# Patient Record
Sex: Female | Born: 1945 | Race: White | Hispanic: No | Marital: Married | Smoking: Former smoker
Health system: Southern US, Community
[De-identification: ages and names within clinical notes are randomized; demographics above are authoritative.]

## PROBLEM LIST (undated history)

## (undated) DIAGNOSIS — R55 Syncope and collapse: Secondary | ICD-10-CM

## (undated) DIAGNOSIS — M1990 Unspecified osteoarthritis, unspecified site: Secondary | ICD-10-CM

## (undated) DIAGNOSIS — E785 Hyperlipidemia, unspecified: Secondary | ICD-10-CM

## (undated) DIAGNOSIS — T7840XA Allergy, unspecified, initial encounter: Secondary | ICD-10-CM

## (undated) HISTORY — DX: Allergy, unspecified, initial encounter: T78.40XA

## (undated) HISTORY — DX: Hyperlipidemia, unspecified: E78.5

## (undated) HISTORY — PX: TIBIA FRACTURE SURGERY: SHX806

## (undated) HISTORY — DX: Unspecified osteoarthritis, unspecified site: M19.90

## (undated) HISTORY — DX: Syncope and collapse: R55

## (undated) HISTORY — PX: BUNIONECTOMY: SHX129

## (undated) HISTORY — PX: JOINT REPLACEMENT: SHX530

## (undated) HISTORY — PX: TUBAL LIGATION: SHX77

---

## 1967-09-08 HISTORY — PX: FRACTURE SURGERY: SHX138

## 2003-09-08 HISTORY — PX: FRACTURE SURGERY: SHX138

## 2013-10-23 DIAGNOSIS — Z01419 Encounter for gynecological examination (general) (routine) without abnormal findings: Secondary | ICD-10-CM | POA: Diagnosis not present

## 2013-10-23 DIAGNOSIS — Z124 Encounter for screening for malignant neoplasm of cervix: Secondary | ICD-10-CM | POA: Diagnosis not present

## 2013-10-23 DIAGNOSIS — Z0189 Encounter for other specified special examinations: Secondary | ICD-10-CM | POA: Diagnosis not present

## 2013-10-23 DIAGNOSIS — Z6826 Body mass index (BMI) 26.0-26.9, adult: Secondary | ICD-10-CM | POA: Diagnosis not present

## 2014-02-12 DIAGNOSIS — N951 Menopausal and female climacteric states: Secondary | ICD-10-CM | POA: Diagnosis not present

## 2014-02-12 DIAGNOSIS — Z86010 Personal history of colonic polyps: Secondary | ICD-10-CM | POA: Diagnosis not present

## 2014-05-01 DIAGNOSIS — Z1231 Encounter for screening mammogram for malignant neoplasm of breast: Secondary | ICD-10-CM | POA: Diagnosis not present

## 2014-10-03 ENCOUNTER — Ambulatory Visit (INDEPENDENT_AMBULATORY_CARE_PROVIDER_SITE_OTHER): Payer: Medicare Other | Admitting: Nurse Practitioner

## 2014-10-03 ENCOUNTER — Encounter: Payer: Self-pay | Admitting: Nurse Practitioner

## 2014-10-03 VITALS — BP 120/70 | HR 57 | Temp 99.0°F | Resp 18 | Ht 61.5 in | Wt 135.0 lb

## 2014-10-03 DIAGNOSIS — Z6824 Body mass index (BMI) 24.0-24.9, adult: Secondary | ICD-10-CM | POA: Insufficient documentation

## 2014-10-03 DIAGNOSIS — Z1211 Encounter for screening for malignant neoplasm of colon: Secondary | ICD-10-CM

## 2014-10-03 DIAGNOSIS — Z79899 Other long term (current) drug therapy: Secondary | ICD-10-CM | POA: Insufficient documentation

## 2014-10-03 DIAGNOSIS — Z6825 Body mass index (BMI) 25.0-25.9, adult: Secondary | ICD-10-CM | POA: Diagnosis not present

## 2014-10-03 DIAGNOSIS — Z0000 Encounter for general adult medical examination without abnormal findings: Secondary | ICD-10-CM

## 2014-10-03 LAB — HEMOGLOBIN A1C: HEMOGLOBIN A1C: 5.4 % (ref 4.6–6.5)

## 2014-10-03 LAB — COMPREHENSIVE METABOLIC PANEL
ALBUMIN: 4.8 g/dL (ref 3.5–5.2)
ALT: 14 U/L (ref 0–35)
AST: 19 U/L (ref 0–37)
Alkaline Phosphatase: 53 U/L (ref 39–117)
BILIRUBIN TOTAL: 0.6 mg/dL (ref 0.2–1.2)
BUN: 15 mg/dL (ref 6–23)
CO2: 29 meq/L (ref 19–32)
CREATININE: 0.76 mg/dL (ref 0.40–1.20)
Calcium: 10.1 mg/dL (ref 8.4–10.5)
Chloride: 100 mEq/L (ref 96–112)
GFR: 80.18 mL/min (ref >60.00)
GLUCOSE: 89 mg/dL (ref 70–99)
Potassium: 4.2 mEq/L (ref 3.5–5.1)
Sodium: 138 mEq/L (ref 135–145)
TOTAL PROTEIN: 7.6 g/dL (ref 6.0–8.3)

## 2014-10-03 LAB — CBC WITH DIFFERENTIAL/PLATELET
BASOS ABS: 0 10*3/uL (ref 0.0–0.1)
Basophils Relative: 0.6 % (ref 0.0–3.0)
EOS ABS: 0.1 10*3/uL (ref 0.0–0.7)
EOS PCT: 2.1 % (ref 0.0–5.0)
HCT: 44.5 % (ref 36.0–46.0)
HEMOGLOBIN: 15 g/dL (ref 12.0–15.0)
LYMPHS ABS: 2.2 10*3/uL (ref 0.7–4.0)
LYMPHS PCT: 39.3 % (ref 12.0–46.0)
MCHC: 33.7 g/dL (ref 30.0–36.0)
MCV: 101.5 fl — ABNORMAL HIGH (ref 78.0–100.0)
MONO ABS: 0.5 10*3/uL (ref 0.1–1.0)
MONOS PCT: 9.5 % (ref 3.0–12.0)
Neutro Abs: 2.7 10*3/uL (ref 1.4–7.7)
Neutrophils Relative %: 48.5 % (ref 43.0–77.0)
Platelets: 207 10*3/uL (ref 150.0–400.0)
RBC: 4.38 Mil/uL (ref 3.87–5.11)
RDW: 13.6 % (ref 11.5–15.5)
WBC: 5.6 10*3/uL (ref 4.0–10.5)

## 2014-10-03 LAB — TSH: TSH: 2.29 u[IU]/mL (ref 0.35–4.50)

## 2014-10-03 LAB — LIPID PANEL
Cholesterol: 257 mg/dL — ABNORMAL HIGH (ref 0–200)
HDL: 104.4 mg/dL (ref >39.00)
LDL CALC: 134 mg/dL — AB (ref 0–99)
NonHDL: 152.6
TRIGLYCERIDES: 92 mg/dL (ref 0.0–149.0)
Total CHOL/HDL Ratio: 2
VLDL: 18.4 mg/dL (ref 0.0–40.0)

## 2014-10-03 LAB — URINALYSIS, MICROSCOPIC ONLY

## 2014-10-03 LAB — T4, FREE: Free T4: 1.43 ng/dL (ref 0.60–1.60)

## 2014-10-03 NOTE — Assessment & Plan Note (Signed)
Discouraged use of HRT for vasomotor symptoms. Pt states she tried to discontinue, was uncomfortable. Is willing to increase risk for breast ca by continuing. Suggest low dose SSRI. Pt declines. Suggest patch rather than pill to decrease risk of breast ca.  No changes today. F/u 1 yr.

## 2014-10-03 NOTE — Assessment & Plan Note (Signed)
Ref to GI for screening.

## 2014-10-03 NOTE — Progress Notes (Signed)
Subjective:     GISELA LEA is a 69 y.o. female and is here to establish care. The patient reports problems - uses HRT for post-menopausal vasomotor symptoms. She has personal Hx of colon polyp removal floowed by Nml colonoscopy and Pos fam Hx: brother colon & prostate cancer, sister breast cancer.  History   Social History  . Marital Status: Married    Spouse Name: N/A    Number of Children: 3  . Years of Education: N/A   Occupational History  .      retired   Social History Main Topics  . Smoking status: Never Smoker   . Smokeless tobacco: Never Used  . Alcohol Use: No  . Drug Use: No  . Sexual Activity: Yes    Birth Control/ Protection: Post-menopausal   Other Topics Concern  . Not on file   Social History Narrative   Ms. Eckersley lives with her husband, She relocated from [place] Nov., 2015. She is retired, has 3 grown sons that live in [place].   Health Maintenance  Topic Date Due  . DEXA SCAN  08/31/2010  . INFLUENZA VACCINE  04/08/2015  . MAMMOGRAM  10/04/2015  . COLONOSCOPY  10/03/2016  . TETANUS/TDAP  10/03/2020  . PNEUMOCOCCAL POLYSACCHARIDE VACCINE AGE 33 AND OVER  Addressed  . ZOSTAVAX  Addressed    The following portions of the patient's history were reviewed and updated as appropriate: allergies, current medications, past family history, past medical history, past social history, past surgical history and problem list.  Review of Systems Constitutional: negative for fatigue and fevers Eyes: negative for visual disturbance Ears, nose, mouth, throat, and face: positive for seasonal rhinnitis Respiratory: negative for cough and dyspnea on exertion Cardiovascular: negative for lower extremity edema and palpitations Gastrointestinal: negative for constipation, diarrhea and reflux symptoms Integument/breast: negative, bothered by occasional facial hair Musculoskeletal:positive for remote Hx of R knee pain. Ortho eval: meniscal tear. Conservative  treatment. Pain resolved. Endocrine: positive for hot flashes relieved by HRT   Objective:    BP 120/70 mmHg  Pulse 57  Temp(Src) 99 F (37.2 C) (Temporal)  Resp 18  Ht 5' 1.5" (1.562 m)  Wt 135 lb (61.236 kg)  BMI 25.10 kg/m2  SpO2 95% General appearance: alert, cooperative, appears stated age and no distress Head: Normocephalic, without obvious abnormality, atraumatic Eyes: negative findings: lids and lashes normal, conjunctivae and sclerae normal, corneas clear and pupils equal, round, reactive to light and accomodation Ears: cloudy fluid & bubbles bilat TM, bones visible Neck: no adenopathy, no carotid bruit, supple, symmetrical, trachea midline and thyroid not enlarged, symmetric, no tenderness/mass/nodules Lungs: clear to auscultation bilaterally Heart: regular rate and rhythm, S1, S2 normal, no murmur, click, rub or gallop Abdomen: soft, non-tender; bowel sounds normal; no masses,  no organomegaly Extremities: extremities normal, atraumatic, no cyanosis or edema    Assessment:Plan    1. BMI 25.0-25.9,adult Low risk, continue exercise 2. Encounter for long-term (current) use of medications HRT - CBC with Differential/Platelet - Comprehensive metabolic panel - Lipid panel - Hemoglobin A1c - Urine Microscopic  3. Colon cancer screening Pos Fam HX. - Ambulatory referral to Gastroenterology  4. Preventative health care - CBC with Differential/Platelet - Comprehensive metabolic panel - Lipid panel - Hemoglobin A1c - TSH - T4, free - Urine Microscopic  Will request historical records See problem list for complete A&P See pt instructions. F/u 1 yr

## 2014-10-03 NOTE — Progress Notes (Signed)
Pre visit review using our clinic review tool, if applicable. No additional management support is needed unless otherwise documented below in the visit note. 

## 2014-10-03 NOTE — Patient Instructions (Signed)
I do not recommend you continue with HRT due to increased risk for stroke, breast cancer, & dementia in women who take HRT longer than 5 years after menopause.  We can manage hot flashes in other ways and management would be temporary-a few months to 1 year.   Let me know if you would like to reconsider.  Please get colonoscopy.  My office will call with lab results.  See you in 1 year.  Very nice to meet you!

## 2014-10-04 ENCOUNTER — Telehealth: Payer: Self-pay | Admitting: Nurse Practitioner

## 2014-10-04 DIAGNOSIS — Z0000 Encounter for general adult medical examination without abnormal findings: Secondary | ICD-10-CM

## 2014-10-04 NOTE — Telephone Encounter (Signed)
pls call pt: Advise Labs look great. No f/u as necessary.

## 2014-10-04 NOTE — Telephone Encounter (Signed)
Patient notified of results.

## 2015-04-22 ENCOUNTER — Telehealth: Payer: Self-pay

## 2015-04-22 NOTE — Telephone Encounter (Signed)
Rec'd from [place] forward 10 pages to GI Historial Provider

## 2015-04-23 ENCOUNTER — Telehealth: Payer: Self-pay | Admitting: Gastroenterology

## 2015-04-23 NOTE — Telephone Encounter (Signed)
Dr Ardis Hughs needs to have further colon and path reports to make a decision.  The records were taken back to the front for further recommendations

## 2015-04-29 ENCOUNTER — Telehealth: Payer: Self-pay | Admitting: Family Medicine

## 2015-04-29 NOTE — Telephone Encounter (Signed)
Pt LMOM stating that she was thinking she should see an ophthalmologist due to her age and she wanted to see if you could recommend someone for her to go to?  Please advise.

## 2015-04-30 NOTE — Telephone Encounter (Signed)
Left detailed message on pt's cell.  Okay per DPR. 

## 2015-04-30 NOTE — Telephone Encounter (Signed)
Please call pt back: Usually if a patient does not have an eye issue or chronic disease that can cause eye issues (glaucoma, diabetes, retina detachment etc) they go to optometrist for eye exams. I am not certain, since I have never met her, if she has any of these current problems or just in need of vision exam (glasses etc). She can check with her insurance to see if they will cover one, compared to other. I do not have a specific person I know of in the area that I would recommend.

## 2015-05-06 ENCOUNTER — Telehealth: Payer: Self-pay | Admitting: Family Medicine

## 2015-05-06 NOTE — Telephone Encounter (Signed)
Patient is requesting a referral to [facility] audiology to have her hearing tested.

## 2015-05-07 NOTE — Telephone Encounter (Signed)
Please advise 

## 2015-05-07 NOTE — Telephone Encounter (Signed)
Please return patients call, she is requesting a referral to audiology. Patient will need to come in for an appointment, so we can discuss her possible hearing loss and we will need to test her hearing here, to see if there is a deficit and a need for audiology referral. Thanks.

## 2015-05-07 NOTE — Telephone Encounter (Signed)
Patient aware.  Appointment scheduled 05/15/15.

## 2015-05-15 ENCOUNTER — Ambulatory Visit: Payer: Medicare Other | Admitting: Family Medicine

## 2015-10-04 ENCOUNTER — Ambulatory Visit (INDEPENDENT_AMBULATORY_CARE_PROVIDER_SITE_OTHER): Payer: Medicare Other | Admitting: Family Medicine

## 2015-10-04 ENCOUNTER — Encounter: Payer: Self-pay | Admitting: Family Medicine

## 2015-10-04 VITALS — BP 117/80 | HR 68 | Temp 97.5°F | Resp 20 | Ht 62.0 in | Wt 133.5 lb

## 2015-10-04 DIAGNOSIS — N951 Menopausal and female climacteric states: Secondary | ICD-10-CM

## 2015-10-04 DIAGNOSIS — Z1212 Encounter for screening for malignant neoplasm of rectum: Secondary | ICD-10-CM

## 2015-10-04 DIAGNOSIS — Z1231 Encounter for screening mammogram for malignant neoplasm of breast: Secondary | ICD-10-CM

## 2015-10-04 DIAGNOSIS — Z1211 Encounter for screening for malignant neoplasm of colon: Secondary | ICD-10-CM | POA: Diagnosis not present

## 2015-10-04 DIAGNOSIS — Z860100 Personal history of colon polyps, unspecified: Secondary | ICD-10-CM

## 2015-10-04 DIAGNOSIS — Z13220 Encounter for screening for lipoid disorders: Secondary | ICD-10-CM | POA: Diagnosis not present

## 2015-10-04 DIAGNOSIS — Z1329 Encounter for screening for other suspected endocrine disorder: Secondary | ICD-10-CM

## 2015-10-04 DIAGNOSIS — IMO0001 Reserved for inherently not codable concepts without codable children: Secondary | ICD-10-CM

## 2015-10-04 DIAGNOSIS — Z136 Encounter for screening for cardiovascular disorders: Secondary | ICD-10-CM

## 2015-10-04 DIAGNOSIS — Z1239 Encounter for other screening for malignant neoplasm of breast: Secondary | ICD-10-CM

## 2015-10-04 DIAGNOSIS — Z131 Encounter for screening for diabetes mellitus: Secondary | ICD-10-CM

## 2015-10-04 DIAGNOSIS — E2839 Other primary ovarian failure: Secondary | ICD-10-CM

## 2015-10-04 HISTORY — DX: Menopausal and female climacteric states: N95.1

## 2015-10-04 NOTE — Progress Notes (Signed)
Patient ID: Sara Doyle, female   DOB: Apr 20, 1946, 70 y.o.   MRN: [id number] Medicare AWV  Date of Visit: 10/04/2015 Physician: Howard Pouch, DO List of providers/suppliers:  See care plan  History of Present Ilness: Sara Doyle, 70 y.o.  female presents today for [place] visit.    Other Complaints: Chief Complaint  Patient presents with  . Annual Exam   vaginal dryness  Health maintenance:  Colonoscopy: 2010 colonoscopy with recommendations for 5 year surveillance for personal history polpys Mammogram: Sister with breast cancer, all normal mammograms, last mammogram 2014 . Referral placed today.  Cervical cancer screening: PAP last 2014. All normal, has GYN Immunizations: Flu vaccination 2016 up-to-date, tetanus 2012 up-to-date, patient completed both pneumonia vaccinations, Zostavax up-to-date. Infectious disease screening: Not indicated Dexa: Patient reports prior bone density scan was greater than 7 years ago and was normal. Patient does supplement with calcium and vitamin D daily.  Review of Systems: Negative, with the exception of above mentioned in HPI  Past medical, surgical, family and social histories reviewed (including experiences with illnesses, hospital stays, operations, injuries, and treatments):  Past Medical History  Diagnosis Date  . Allergy    Past Surgical History  Procedure Laterality Date  . Tubal ligation    . 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 Hypertension Mother   . Arthritis Father     osteo  . Heart disease Brother     congenital  . Cancer Sister 6    breast  . Arthritis Sister   . Hypertension Sister   . Cancer Brother     prostate, colon  . Arrhythmia Brother     a-fib   Social History   Social History  . Marital Status: Married    Spouse Name: N/A  . Number of Children: 3  . Years of Education: N/A   Occupational History  .     retired   Social History Main Topics  . Smoking status: Never Smoker   . Smokeless tobacco: Never Used  . Alcohol Use: No  . Drug Use: No  . Sexual Activity: Yes    Birth Control/ Protection: Post-menopausal   Other Topics Concern  . Not on file   Social History Narrative   Sara Doyle lives with her husband, She relocated from [place] Nov., 2015. She is retired, has 3 grown sons that live in [place].   Smoke alarm in the home, wears her seatbelt, uses sunscreen.   Ambulates independently. No dentures.   Has dentist with regular appts.    Feels safe in her relationships    Medications and Allergies reviewed:  Current outpatient prescriptions:  .  aspirin 81 MG tablet, Take 81 mg by mouth daily., Disp: , Rfl:  .  Biotin 5000 MCG TABS, Take 5,000 mg by mouth., Disp: , Rfl:  .  Calcium Carb-Cholecalciferol (CALCIUM 600 + D PO), Take by mouth., Disp: , Rfl:  Allergies  Allergen Reactions  . Demerol [Meperidine] Nausea And Vomiting   Functional Status Survey: Is the patient deaf or have difficulty hearing?: No Does the patient have difficulty seeing, even when wearing glasses/contacts?: No Does the patient have difficulty concentrating, remembering, or making decisions?: No Does the patient have difficulty walking or climbing stairs?: No Does the patient have difficulty dressing or bathing?: No Does the patient have difficulty doing errands alone such as visiting a doctor's office  or shopping?: No   Impairments: Impairments of Body Functions:  Mental Functions:Pt is mentally clear.  Sensory Functions:None Skin and Related Structures None Impairments of Body Strutures:  Movement Related Structures: none  ADLs / IADLs: Pt performs all ADL/IDL independently   Risk Evaluation:  Depression screen [facility] 2/9 10/04/2015 10/04/2015  Decreased Interest 0 0  Down, Depressed, Hopeless 0 0  PHQ - 2 Score 0 0    Advanced Care Planning Code status on file: no MOST form information  and copy given: no [place] information provided: no Advanced care directive scanned into chart: no  Preventive Screen Health Maintenance Topics with due status: Overdue     Topic Date Due   Hepatitis C Screening 04/08/1946   DEXA SCAN 08/31/2010   MAMMOGRAM 04/08/2015    Immunizations Immunization History  Administered Date(s) Administered  . Influenza-Unspecified 06/23/2015  . Pneumococcal Conjugate-13 02/12/2014  . Pneumococcal Polysaccharide-23 01/06/2011  . Tdap 01/06/2003   Cardiovascular Screening Blood Tests--> ordered for future, pt not fasting today Glaucoma Screening  Date of Last ophthalmology evaluation: last year 2016 Done by: Hassell Done  Vital Signs Weight: 133 lb 8 oz (60.555 kg) Body mass index is 24.41 kg/(m^2). CrCl cannot be calculated (Patient has no serum creatinine result on file.). Body surface area is 1.63 meters squared. Filed Vitals:   10/04/15 1432  BP: 117/80  Pulse: 68  Temp: 97.5 F (36.4 C)  Resp: 20  Height: 5\' 2"  (1.575 m)  Weight: 133 lb 8 oz (60.555 kg)  SpO2: 97%   Wt Readings from Last 2 Encounters:  10/04/15 133 lb 8 oz (60.555 kg)  10/03/14 135 lb (61.236 kg)    Physical Examination Findings Gen: Afebrile. No acute distress. Nontoxic in appearance, well-developed, well-nourished, Caucasian female. HENT: AT. [place]. Bilateral TM visualized and normal in appearance. MMM. Bilateral nares without erythema or swelling. Throat without erythema or exudates. No cough on exam Eyes:Pupils Equal Round Reactive to light, Extraocular movements intact,  Conjunctiva without redness, discharge or icterus. Neck/lymp/endocrine: Supple, no lymphadenopathy, no thyromegaly CV: RRR , no murmurs clicks gallops or rubs, no edema, +2/4 P posterior tibialis pulses Chest: CTAB, no wheeze or crackles Abd: Soft. Flat. NTND. BS present. No Masses palpated.  MSK: Full range of motion, no obvious deformities. No erythema, no soft tissue  swelling. Neurovascularly intact distally. Skin: No rashes, purpura or petechiae.  Neuro: Normal gait. PERLA. EOMi. Alert. Oriented. Cranial nerves II through XII intact. Muscle strength 5/5 upper and lower extremity. DTRs equal bilaterally. Psych: Normal affect, dress and demeanor. Normal speech. Normal thought content and judgment..    I reviewed Point of Care labs: No results found for this or any previous visit (from the past 24 hour(s)).  Labs: Office Visit on 10/03/2014  Component Date Value  . WBC 10/03/2014 5.6   . RBC 10/03/2014 4.38   . Hemoglobin 10/03/2014 15.0   . HCT 10/03/2014 44.5   . MCV 10/03/2014 101.5*  . MCHC 10/03/2014 33.7   . RDW 10/03/2014 13.6   . Platelets 10/03/2014 207.0   . Neutrophils Relative % 10/03/2014 48.5   . Lymphocytes Relative 10/03/2014 39.3   . Monocytes Relative 10/03/2014 9.5   . Eosinophils Relative 10/03/2014 2.1   . Basophils Relative 10/03/2014 0.6   . Neutro Abs 10/03/2014 2.7   . Lymphs Abs 10/03/2014 2.2   . Monocytes Absolute 10/03/2014 0.5   . Eosinophils Absolute 10/03/2014 0.1   . Basophils Absolute 10/03/2014 0.0   . Sodium  10/03/2014 138   . Potassium 10/03/2014 4.2   . Chloride 10/03/2014 100   . CO2 10/03/2014 29   . Glucose, Bld 10/03/2014 89   . BUN 10/03/2014 15   . Creatinine, Ser 10/03/2014 0.76   . Total Bilirubin 10/03/2014 0.6   . Alkaline Phosphatase 10/03/2014 53   . AST 10/03/2014 19   . ALT 10/03/2014 14   . Total Protein 10/03/2014 7.6   . Albumin 10/03/2014 4.8   . Calcium 10/03/2014 10.1   . GFR 10/03/2014 80.18   . Cholesterol 10/03/2014 257*  . Triglycerides 10/03/2014 92.0   . HDL 10/03/2014 104.40   . VLDL 10/03/2014 18.4   . LDL Cholesterol 10/03/2014 134*  . Total CHOL/HDL Ratio 10/03/2014 2   . NonHDL 10/03/2014 152.60   . Hgb A1c MFr Bld 10/03/2014 5.4   . TSH 10/03/2014 2.29   . Free T4 10/03/2014 1.43   . WBC, UA 10/03/2014 0-2/hpf   . RBC / HPF 10/03/2014 0-2/hpf   . Squamous  Epithelial / LPF 10/03/2014 Rare(0-4/hpf)      Assessment and Plan: Myeesha was seen today for annual exam.  Diagnoses and all orders for this visit:  Vaginal dryness, menopausal - discussed water-based lubricants, patient provided with a list of over-the-counter over-the-counter lubricants for trial.    Education and Counseling Provided:  See after visit summary that was printed and given to patient 1. Tobacco abuse counseling 2. Alcohol abuse counseling 3. STIs counseling 4. Referrals made  Referrals made: For mammogram, DEXA scan and colonoscopy. Health vaccinations are up-to-date.    Future labs placed (pt is not fasting today)

## 2015-10-04 NOTE — Patient Instructions (Addendum)
  Sara Doyle , Thank you for taking time to come for your Medicare Wellness Visit. I appreciate your ongoing commitment to your health goals. Please review the following plan we discussed and let me know if I can assist you in the future.     This is a list of the screening recommended for you and due dates:  Health Maintenance  Topic Date Due  .  Hepatitis C: One time screening is recommended by Center for Disease Control  (CDC) for  adults born from [birth year] through 1966.   [birth year]  . DEXA scan (bone density measurement)  08/31/2010  . Mammogram  04/08/2015  . Flu Shot  04/07/2016  . Colon Cancer Screening  08/10/2019  . Tetanus Vaccine  10/03/2020  . Shingles Vaccine  Addressed  . Pneumonia vaccines  Completed    We ordered your bone density, mammogram and colonoscopy referral.

## 2015-10-08 ENCOUNTER — Encounter: Payer: Self-pay | Admitting: Gastroenterology

## 2015-10-10 ENCOUNTER — Other Ambulatory Visit: Payer: Medicare Other

## 2015-10-10 ENCOUNTER — Other Ambulatory Visit: Payer: Self-pay | Admitting: Family Medicine

## 2015-10-10 DIAGNOSIS — Z1329 Encounter for screening for other suspected endocrine disorder: Secondary | ICD-10-CM

## 2015-10-10 DIAGNOSIS — Z1212 Encounter for screening for malignant neoplasm of rectum: Secondary | ICD-10-CM

## 2015-10-10 DIAGNOSIS — E2839 Other primary ovarian failure: Secondary | ICD-10-CM | POA: Diagnosis not present

## 2015-10-10 DIAGNOSIS — N951 Menopausal and female climacteric states: Secondary | ICD-10-CM

## 2015-10-10 DIAGNOSIS — Z131 Encounter for screening for diabetes mellitus: Secondary | ICD-10-CM

## 2015-10-10 DIAGNOSIS — Z1211 Encounter for screening for malignant neoplasm of colon: Secondary | ICD-10-CM | POA: Diagnosis not present

## 2015-10-10 DIAGNOSIS — Z1231 Encounter for screening mammogram for malignant neoplasm of breast: Secondary | ICD-10-CM

## 2015-10-10 DIAGNOSIS — Z136 Encounter for screening for cardiovascular disorders: Secondary | ICD-10-CM | POA: Diagnosis not present

## 2015-10-10 DIAGNOSIS — Z13220 Encounter for screening for lipoid disorders: Secondary | ICD-10-CM | POA: Diagnosis not present

## 2015-10-10 DIAGNOSIS — IMO0001 Reserved for inherently not codable concepts without codable children: Secondary | ICD-10-CM

## 2015-10-10 DIAGNOSIS — Z1239 Encounter for other screening for malignant neoplasm of breast: Secondary | ICD-10-CM

## 2015-10-10 LAB — CBC WITH DIFFERENTIAL/PLATELET
BASOS PCT: 0 % (ref 0–1)
Basophils Absolute: 0 10*3/uL (ref 0.0–0.1)
Eosinophils Absolute: 0.1 10*3/uL (ref 0.0–0.7)
Eosinophils Relative: 2 % (ref 0–5)
HCT: 41 % (ref 36.0–46.0)
HEMOGLOBIN: 13.7 g/dL (ref 12.0–15.0)
Lymphocytes Relative: 43 % (ref 12–46)
Lymphs Abs: 1.8 10*3/uL (ref 0.7–4.0)
MCH: 33.5 pg (ref 26.0–34.0)
MCHC: 33.4 g/dL (ref 30.0–36.0)
MCV: 100.2 fL — ABNORMAL HIGH (ref 78.0–100.0)
MONO ABS: 0.5 10*3/uL (ref 0.1–1.0)
MPV: 11.3 fL (ref 8.6–12.4)
Monocytes Relative: 11 % (ref 3–12)
NEUTROS ABS: 1.9 10*3/uL (ref 1.7–7.7)
Neutrophils Relative %: 44 % (ref 43–77)
PLATELETS: 237 10*3/uL (ref 150–400)
RBC: 4.09 MIL/uL (ref 3.87–5.11)
RDW: 13.2 % (ref 11.5–15.5)
WBC: 4.3 10*3/uL (ref 4.0–10.5)

## 2015-10-10 LAB — LIPID PANEL
Cholesterol: 234 mg/dL — ABNORMAL HIGH (ref 125–200)
HDL: 112 mg/dL (ref >=46)
LDL Cholesterol: 111 mg/dL (ref <130)
Total CHOL/HDL Ratio: 2.1 Ratio (ref <=5.0)
Triglycerides: 56 mg/dL (ref <150)
VLDL: 11 mg/dL (ref <30)

## 2015-10-10 LAB — TSH: TSH: 3.498 u[IU]/mL (ref 0.350–4.500)

## 2015-10-10 LAB — COMPREHENSIVE METABOLIC PANEL
ALBUMIN: 4.4 g/dL (ref 3.6–5.1)
ALT: 12 U/L (ref 6–29)
AST: 16 U/L (ref 10–35)
Alkaline Phosphatase: 57 U/L (ref 33–130)
BILIRUBIN TOTAL: 0.5 mg/dL (ref 0.2–1.2)
BUN: 17 mg/dL (ref 7–25)
CO2: 28 mmol/L (ref 20–31)
CREATININE: 0.67 mg/dL (ref 0.60–0.93)
Calcium: 9.3 mg/dL (ref 8.6–10.4)
Chloride: 98 mmol/L (ref 98–110)
GLUCOSE: 86 mg/dL (ref 65–99)
Potassium: 4.5 mmol/L (ref 3.5–5.3)
SODIUM: 133 mmol/L — AB (ref 135–146)
Total Protein: 6.8 g/dL (ref 6.1–8.1)

## 2015-10-11 ENCOUNTER — Telehealth: Payer: Self-pay | Admitting: Family Medicine

## 2015-10-11 NOTE — Telephone Encounter (Signed)
Please call patient, all of her lab work looks stable today. Her cholesterol is mildly elevated with a total cholesterol of approximately 230. She could consider fish oil supplementation or just increase her exercise, and stricter dietary habits with low saturated fats, fresh fruits and vegetables.

## 2015-10-11 NOTE — Telephone Encounter (Signed)
Left message with lab results and instructions on patient voice mail per DPR 

## 2015-11-07 ENCOUNTER — Other Ambulatory Visit: Payer: Medicare Other

## 2015-11-07 ENCOUNTER — Inpatient Hospital Stay: Admission: RE | Admit: 2015-11-07 | Payer: Medicare Other | Source: Ambulatory Visit

## 2015-11-07 ENCOUNTER — Ambulatory Visit: Payer: Medicare Other

## 2015-11-29 ENCOUNTER — Ambulatory Visit
Admission: RE | Admit: 2015-11-29 | Discharge: 2015-11-29 | Disposition: A | Discharge status: Home / Self Care | Payer: Medicare Other | Source: Ambulatory Visit | Attending: Family Medicine | Admitting: Family Medicine

## 2015-11-29 DIAGNOSIS — M85851 Other specified disorders of bone density and structure, right thigh: Secondary | ICD-10-CM | POA: Diagnosis not present

## 2015-11-29 DIAGNOSIS — E2839 Other primary ovarian failure: Secondary | ICD-10-CM

## 2015-11-29 DIAGNOSIS — Z1231 Encounter for screening mammogram for malignant neoplasm of breast: Secondary | ICD-10-CM

## 2015-11-29 DIAGNOSIS — Z0000 Encounter for general adult medical examination without abnormal findings: Secondary | ICD-10-CM

## 2015-12-02 ENCOUNTER — Telehealth: Payer: Self-pay | Admitting: Family Medicine

## 2015-12-02 DIAGNOSIS — M8580 Other specified disorders of bone density and structure, unspecified site: Secondary | ICD-10-CM | POA: Insufficient documentation

## 2015-12-02 NOTE — Telephone Encounter (Signed)
Spoke with patient reviewed results and instructions . Patient verbalized understanding. 

## 2015-12-02 NOTE — Telephone Encounter (Signed)
Please call patient: Her bone density scan showed that she has osteopenia, which is softening of the bone. We would want to ensure she doesn't progress to osteoporosis, therefore she needs to make certain she is supplementing with 1000 units of vitamin D daily and calcium supplementation. - monitor her bone density 2-3 years.

## 2015-12-03 ENCOUNTER — Ambulatory Visit (INDEPENDENT_AMBULATORY_CARE_PROVIDER_SITE_OTHER): Payer: Medicare Other | Admitting: Gastroenterology

## 2015-12-03 ENCOUNTER — Encounter: Payer: Self-pay | Admitting: Gastroenterology

## 2015-12-03 VITALS — BP 126/64 | HR 70 | Ht 61.5 in | Wt 133.0 lb

## 2015-12-03 DIAGNOSIS — Z86010 Personal history of colonic polyps: Secondary | ICD-10-CM | POA: Diagnosis not present

## 2015-12-03 NOTE — Progress Notes (Signed)
HPI: This is a   very pleasant 70 year old woman    who was referred to me by Howard Pouch A, DO  to evaluate  history of colon polyps .    Chief complaint is history of colon polyps  Colonoscopy 2010 at [facility] in [place] done for "high risk colon cancer surveillance, personal history of colon polyps. "  The examination was normal.  She was recommended to have repeat colonoscopy at five-year interval  Also had colonoscopy 2007; we don't have those results here.  She understands she had polyps (2-3 of them).   She is not sure what type of polyps were found.  Cbc 10/2015 showed she is not anemic  Moved to  2015:   No FH of colon cancer.  Stable weight.  No Gi issues, no bleeding.  Review of systems: Pertinent positive and negative review of systems were noted in the above HPI section. Complete review of systems was performed and was otherwise normal.   Past Medical History  Diagnosis Date  . Allergy     Past Surgical History  Procedure Laterality Date  . Tubal ligation    . 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 aspirin 81 MG tablet Take 81 mg by mouth daily.    . Biotin 5000 MCG TABS Take 5,000 mg by mouth.    . Calcium Carb-Cholecalciferol (CALCIUM 600 + D PO) Take by mouth.     No current facility-administered medications for this visit.    Allergies as of 12/03/2015 - Review Complete 12/03/2015  Allergen Reaction Noted  . Demerol [meperidine] Nausea And Vomiting 10/03/2014    Family History  Problem Relation Age of Onset  . Hypertension Mother   . Arthritis Father     osteo  . Heart disease Brother     congenital  . Cancer Sister 12    breast  . Arthritis Sister   . Hypertension Sister   . Cancer Brother     prostate, colon  . Arrhythmia Brother     a-fib    Social History   Social History  . Marital Status: Married    Spouse Name: N/A  . Number  of Children: 3  . Years of Education: N/A   Occupational History  .      retired   Social History Main Topics  . Smoking status: Never Smoker   . Smokeless tobacco: Never Used  . Alcohol Use: No  . Drug Use: No  . Sexual Activity: Yes    Birth Control/ Protection: Post-menopausal   Other Topics Concern  . Not on file   Social History Narrative   Ms. Chiba lives with her husband, She relocated from [place] Nov., 2015. She is retired, has 3 grown sons that live in [place].   Smoke alarm in the home, wears her seatbelt, uses sunscreen.   Ambulates independently. No dentures.   Has dentist with regular appts.    Feels safe in her relationships     Physical Exam: BP 126/64 mmHg  Pulse 70  Ht 5' 1.5" (1.562 m)  Wt 133 lb (60.328 kg)  BMI 24.73 kg/m2 Constitutional: generally well-appearing Psychiatric: alert and oriented x3 Eyes: extraocular movements intact Mouth: oral pharynx moist, no lesions Neck: supple no lymphadenopathy Cardiovascular: heart regular rate and rhythm Lungs: clear to auscultation bilaterally Abdomen: soft, nontender, nondistended, no obvious ascites, no peritoneal signs, normal  bowel sounds Extremities: no lower extremity edema bilaterally Skin: no lesions on visible extremities   Assessment and plan: 70 y.o. female with  Personal history of colon polyps  We will try to get records from her 2007 colonoscopy, pathology report as well. That will help determine the timing of her next needed screening, surveillance colonoscopy examination. She has no GI symptoms, no family history that is concerning for colon cancer. I see no reason for any further blood tests or imaging studies currently    Owens Loffler, MD [facility] 12/03/2015, 1:58 PM  Cc: Howard Pouch A, DO

## 2015-12-03 NOTE — Patient Instructions (Signed)
We will get records sent from your previous gastroenterologist([facility], 2007 colonoscopy at [facility]) for review.  This will include any endoscopic (colonoscopy or upper endoscopy) procedures and any associated pathology reports.   After reviewing those records, will decide timing of next colonoscopy.

## 2016-01-16 ENCOUNTER — Telehealth: Payer: Self-pay | Admitting: Gastroenterology

## 2016-01-16 NOTE — Telephone Encounter (Signed)
Pt has been notified that we have not received the records and that the release was refaxed.  She will call that office and try to access the records herself.

## 2016-06-15 DIAGNOSIS — Z23 Encounter for immunization: Secondary | ICD-10-CM | POA: Diagnosis not present

## 2016-06-16 ENCOUNTER — Encounter: Payer: Self-pay | Admitting: Family Medicine

## 2016-06-17 ENCOUNTER — Other Ambulatory Visit: Payer: Self-pay | Admitting: Family Medicine

## 2016-06-17 MED ORDER — ACETAZOLAMIDE 125 MG PO TABS: ORAL_TABLET | ORAL | 0 refills | Status: DC

## 2016-06-17 NOTE — Progress Notes (Signed)
Diamox prescribed.

## 2016-08-07 ENCOUNTER — Telehealth: Payer: Self-pay | Admitting: Gastroenterology

## 2016-08-07 NOTE — Telephone Encounter (Signed)
Rec'd from [facility] forward 3 pages to Dr. Ardis Hughs

## 2016-09-04 ENCOUNTER — Telehealth: Payer: Self-pay | Admitting: Gastroenterology

## 2016-09-09 NOTE — Telephone Encounter (Signed)
Dr Ardis Hughs- There is a note from 08/07/16 indicating that records from [facility] were forwarded to our office. However, I see no documentation that we have received any records and no sign of the records themselves. To you have any recollection as to whether you have seen anything on this lady?

## 2016-09-11 NOTE — Telephone Encounter (Signed)
I have spoken to patient to advise that Dr Ardis Hughs has reviewed colonoscopies from 2006 and 2010 and now recommends repeat colonoscopy in 08-2019. Patient verbalizes understanding and recall colonoscopy has been placed in EPIC.

## 2016-09-11 NOTE — Telephone Encounter (Signed)
I reviewed the records from 2006 colonoscopy . She had a single subCM tubular adenoma. This is a low risk adenoma and so since her colonoscopy in 08/2009 was normal she needs recall colonoscopy 10 years from then.   Please let her know I reviewed this and put her in for recall colonsocopy 08/2019.  thanks

## 2016-09-24 ENCOUNTER — Encounter: Payer: Self-pay | Admitting: Family Medicine

## 2016-10-02 ENCOUNTER — Telehealth: Payer: Self-pay

## 2016-10-02 NOTE — Telephone Encounter (Signed)
Called to speak to patient regarding AWV. Requesting for Health Coach to complete rather than PCP, no VM available.

## 2016-10-05 ENCOUNTER — Ambulatory Visit (INDEPENDENT_AMBULATORY_CARE_PROVIDER_SITE_OTHER): Payer: Medicare Other

## 2016-10-05 VITALS — BP 118/68 | HR 79 | Ht 62.0 in | Wt 134.1 lb

## 2016-10-05 DIAGNOSIS — Z0000 Encounter for general adult medical examination without abnormal findings: Secondary | ICD-10-CM | POA: Diagnosis not present

## 2016-10-05 NOTE — Progress Notes (Signed)
Subjective:   Sara Doyle is a 71 y.o. female who presents for Medicare Annual (Subsequent) preventive examination.  Review of Systems:  No ROS.  Medicare Wellness Visit.  Cardiac Risk Factors include: advanced age (>30men, >6 women);family history of premature cardiovascular disease   Sleep patterns: sleeps 6 hours, voids x 1. Occasionally has difficulty going back to sleep, discussed avoiding use of cell phone and completing puzzles during these times.  Home Safety/Smoke Alarms: Smoke detectors and security in place.  Living environment; residence and Firearm Safety: Lives with husband in 2 story home. Firearms locked away.  Seat Belt Safety/Bike Helmet: Wears seat belt.   Counseling:   Eye Exam-Last exam 05/2016, yearly by Rolene Arbour exam 04/2016, every 6 months by Sallyanne Kuster  Female:   Pap-N/A       Mammo- 12/10/2015,  Negative.      Dexa scan-11/29/2015, Osteopenia. Takes Calcium with Vitamin D.  CCS-colonoscopy 08/09/2009, normal. Recall 10 years.        Objective:     Vitals: BP 118/68 (BP Location: Right Arm, Patient Position: Sitting, Cuff Size: Normal)   Pulse 79   Ht 5\' 2"  (1.575 m)   Wt 134 lb 1.9 oz (60.8 kg)   SpO2 95%   BMI 24.53 kg/m   Body mass index is 24.53 kg/m.   Tobacco History  Smoking Status  . Never Smoker  Smokeless Tobacco  . Never Used     Counseling given: Not Answered   Past Medical History:  Diagnosis Date  . Allergy    Past Surgical History:  Procedure Laterality Date  . FRACTURE SURGERY  1969   right leg  . FRACTURE SURGERY  2005   right leg  . 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 TUBAL LIGATION     Family History  Problem Relation Age of Onset  . Hypertension Mother   . Arthritis Father     osteo  . Heart disease Brother     congenital  . Cancer Sister 38    breast  . Arthritis Sister   . Hypertension Sister   . Cancer Brother     prostate, colon  . Arrhythmia Brother      a-fib   History  Sexual Activity  . Sexual activity: Yes  . Birth control/ protection: Post-menopausal    Outpatient Encounter Prescriptions as of 10/05/2016  Medication Sig  . aspirin 81 MG tablet Take 81 mg by mouth daily.  . Biotin 5000 MCG TABS Take 5,000 mg by mouth.  . Calcium Carb-Cholecalciferol (CALCIUM 600 + D PO) Take by mouth.  Marland Kitchen acetaZOLAMIDE (DIAMOX) 125 MG tablet 125 mg BID, start 1 day before travel and continue until 3 days at same elevation or descent. (Patient not taking: Reported on 10/05/2016)   No facility-administered encounter medications on file as of 10/05/2016.     Activities of Daily Living In your present state of health, do you have any difficulty performing the following activities: 10/05/2016  Hearing? N  Vision? N  Difficulty concentrating or making decisions? N  Walking or climbing stairs? N  Dressing or bathing? N  Doing errands, shopping? N  Preparing Food and eating ? N  Using the Toilet? N  In the past six months, have you accidently leaked urine? N  Do you have problems with loss of bowel control? N  Managing your Medications? N  Managing your Finances? N  Housekeeping or managing your Housekeeping? N  Some  recent data might be hidden    Patient Care Team: Ma Hillock, DO as PCP - General (Family Medicine) Milus Banister, MD as Attending Physician (Gastroenterology)    Assessment:    Physical assessment deferred to PCP.  Exercise Activities and Dietary recommendations Current Exercise Habits: Structured exercise class, Type of exercise: walking;strength training/weights (thai chi), Frequency (Times/Week): 3, Intensity: Moderate, Exercise limited by: None identified   Diet (meal preparation, eat out, water intake, caffeinated beverages, dairy products, fruits and vegetables): Eats 3 meals/day. Snacks on fruits and vegetables. Drinks 8 cups coffee (decaf) and water.   Breakfast: oatmeal with fruit, yogurt Lunch: salad, fruit,  cheese Dinner: meat, vegetables, salad. Limited red meat (1 time week)  Encouraged to continue to make food choices and remain active.   Goals    . Weight (lb) < 124 lb (56.2 kg)          Would like to lose 10 pounds by decreasing about of salty snacks (nuts).       Fall Risk Fall Risk  10/05/2016 10/04/2015 10/04/2015  Falls in the past year? No No No  Risk for fall due to : - Impaired vision -   Depression Screen PHQ 2/9 Scores 10/05/2016 10/04/2015 10/04/2015  PHQ - 2 Score 0 0 0     Cognitive Function       Ad8 score reviewed for issues:  Issues making decisions:no  Less interest in hobbies / activities:no  Repeats questions, stories (family complaining):no  Trouble using ordinary gadgets (microwave, computer, phone):no  Forgets the month or year: no  Mismanaging finances: no  Remembering appts:no  Daily problems with thinking and/or memory:no Ad8 score is=0  Patient [facility] at [facility].      Immunization History  Administered Date(s) Administered  . Influenza-Unspecified 06/23/2015, 06/21/2016  . Pneumococcal Conjugate-13 02/12/2014  . Pneumococcal Polysaccharide-23 01/06/2011  . Tdap 01/06/2003   Screening Tests Health Maintenance  Topic Date Due  . Hepatitis C Screening  [birth year]  . MAMMOGRAM  11/28/2017  . COLONOSCOPY  08/10/2019  . TETANUS/TDAP  10/03/2020  . INFLUENZA VACCINE  Completed  . DEXA SCAN  Completed  . ZOSTAVAX  Addressed  . PNA vac Low Risk Adult  Completed      Plan:     Continue to eat heart healthy diet (full of fruits, vegetables, whole grains, lean protein, water--limit salt, fat, and sugar intake) and increase physical activity as tolerated.  Continue doing brain stimulating activities (puzzles, reading, adult coloring books, staying active) to keep memory sharp.   Bring a copy of your advance directives to your next office visit.    During the course of the visit the patient was educated and  counseled about the following appropriate screening and preventive services:   Vaccines to include Pneumoccal, Influenza, Hepatitis B, Td, Zostavax, HCV  Cardiovascular Disease  Colorectal cancer screening  Bone density screening  Diabetes screening  Glaucoma screening  Mammography/PAP  Nutrition counseling   Patient Instructions (the written plan) was given to the patient.   Gerilyn Nestle, RN  10/05/2016   Medical screening examination/treatment/procedure(s) were performed by non-physician practitioner and as supervising physician I was immediately available for consultation/collaboration.  I agree with above assessment and plan.  Electronically Signed by: Howard Pouch, DO [facility]

## 2016-10-05 NOTE — Progress Notes (Signed)
Pre visit review using our clinic review tool, if applicable. No additional management support is needed unless otherwise documented below in the visit note. 

## 2016-10-05 NOTE — Patient Instructions (Addendum)
Continue to eat heart healthy diet (full of fruits, vegetables, whole grains, lean protein, water--limit salt, fat, and sugar intake) and increase physical activity as tolerated.  Continue doing brain stimulating activities (puzzles, reading, adult coloring books, staying active) to keep memory sharp.   Bring a copy of your advance directives to your next office visit.    Fall Prevention in the Home Introduction Falls can cause injuries. They can happen to people of all ages. There are many things you can do to make your home safe and to help prevent falls. What can I do on the outside of my home?  Regularly fix the edges of walkways and driveways and fix any cracks.  Remove anything that might make you trip as you walk through a door, such as a raised step or threshold.  Trim any bushes or trees on the path to your home.  Use bright outdoor lighting.  Clear any walking paths of anything that might make someone trip, such as rocks or tools.  Regularly check to see if handrails are loose or broken. Make sure that both sides of any steps have handrails.  Any raised decks and porches should have guardrails on the edges.  Have any leaves, snow, or ice cleared regularly.  Use sand or salt on walking paths during winter.  Clean up any spills in your garage right away. This includes oil or grease spills. What can I do in the bathroom?  Use night lights.  Install grab bars by the toilet and in the tub and shower. Do not use towel bars as grab bars.  Use non-skid mats or decals in the tub or shower.  If you need to sit down in the shower, use a plastic, non-slip stool.  Keep the floor dry. Clean up any water that spills on the floor as soon as it happens.  Remove soap buildup in the tub or shower regularly.  Attach bath mats securely with double-sided non-slip rug tape.  Do not have throw rugs and other things on the floor that can make you trip. What can I do in the  bedroom?  Use night lights.  Make sure that you have a light by your bed that is easy to reach.  Do not use any sheets or blankets that are too big for your bed. They should not hang down onto the floor.  Have a firm chair that has side arms. You can use this for support while you get dressed.  Do not have throw rugs and other things on the floor that can make you trip. What can I do in the kitchen?  Clean up any spills right away.  Avoid walking on wet floors.  Keep items that you use a lot in easy-to-reach places.  If you need to reach something above you, use a strong step stool that has a grab bar.  Keep electrical cords out of the way.  Do not use floor polish or wax that makes floors slippery. If you must use wax, use non-skid floor wax.  Do not have throw rugs and other things on the floor that can make you trip. What can I do with my stairs?  Do not leave any items on the stairs.  Make sure that there are handrails on both sides of the stairs and use them. Fix handrails that are broken or loose. Make sure that handrails are as long as the stairways.  Check any carpeting to make sure that it is firmly attached to  the stairs. Fix any carpet that is loose or worn.  Avoid having throw rugs at the top or bottom of the stairs. If you do have throw rugs, attach them to the floor with carpet tape.  Make sure that you have a light switch at the top of the stairs and the bottom of the stairs. If you do not have them, ask someone to add them for you. What else can I do to help prevent falls?  Wear shoes that:  Do not have high heels.  Have rubber bottoms.  Are comfortable and fit you well.  Are closed at the toe. Do not wear sandals.  If you use a stepladder:  Make sure that it is fully opened. Do not climb a closed stepladder.  Make sure that both sides of the stepladder are locked into place.  Ask someone to hold it for you, if possible.  Clearly mark and make  sure that you can see:  Any grab bars or handrails.  First and last steps.  Where the edge of each step is.  Use tools that help you move around (mobility aids) if they are needed. These include:  Canes.  Walkers.  Scooters.  Crutches.  Turn on the lights when you go into a dark area. Replace any light bulbs as soon as they burn out.  Set up your furniture so you have a clear path. Avoid moving your furniture around.  If any of your floors are uneven, fix them.  If there are any pets around you, be aware of where they are.  Review your medicines with your doctor. Some medicines can make you feel dizzy. This can increase your chance of falling. Ask your doctor what other things that you can do to help prevent falls. This information is not intended to replace advice given to you by your health care provider. Make sure you discuss any questions you have with your health care provider. Document Released: 06/20/2009 Document Revised: 01/30/2016 Document Reviewed: 09/28/2014  2017 [employer]vier  Health Maintenance, Female Introduction Adopting a healthy lifestyle and getting preventive care can go a long way to promote health and wellness. Talk with your health care provider about what schedule of regular examinations is right for you. This is a good chance for you to check in with your provider about disease prevention and staying healthy. In between checkups, there are plenty of things you can do on your own. Experts have done a lot of research about which lifestyle changes and preventive measures are most likely to keep you healthy. Ask your health care provider for more information. Weight and diet Eat a healthy diet  Be sure to include plenty of vegetables, fruits, low-fat dairy products, and lean protein.  Do not eat a lot of foods high in solid fats, added sugars, or salt.  Get regular exercise. This is one of the most important things you can do for your health.  Most  adults should exercise for at least 150 minutes each week. The exercise should increase your heart rate and make you sweat (moderate-intensity exercise).  Most adults should also do strengthening exercises at least twice a week. This is in addition to the moderate-intensity exercise. Maintain a healthy weight  Body mass index (BMI) is a measurement that can be used to identify possible weight problems. It estimates body fat based on height and weight. Your health care provider can help determine your BMI and help you achieve or maintain a healthy weight.  For  females 3 years of age and older:  A BMI below 18.5 is considered underweight.  A BMI of 18.5 to 24.9 is normal.  A BMI of 25 to 29.9 is considered overweight.  A BMI of 30 and above is considered obese. Watch levels of cholesterol and blood lipids  You should start having your blood tested for lipids and cholesterol at 72 years of age, then have this test every 5 years.  You may need to have your cholesterol levels checked more often if:  Your lipid or cholesterol levels are high.  You are older than 72 years of age.  You are at high risk for heart disease. Cancer screening Lung Cancer  Lung cancer screening is recommended for adults 38-80 years old who are at high risk for lung cancer because of a history of smoking.  A yearly low-dose CT scan of the lungs is recommended for people who:  Currently smoke.  Have quit within the past 15 years.  Have at least a 30-pack-year history of smoking. A pack year is smoking an average of one pack of cigarettes a day for 1 year.  Yearly screening should continue until it has been 15 years since you quit.  Yearly screening should stop if you develop a health problem that would prevent you from having lung cancer treatment. Breast Cancer  Practice breast self-awareness. This means understanding how your breasts normally appear and feel.  It also means doing regular breast  self-exams. Let your health care provider know about any changes, no matter how small.  If you are in your 20s or 30s, you should have a clinical breast exam (CBE) by a health care provider every 1-3 years as part of a regular health exam.  If you are 40 or older, have a CBE every year. Also consider having a breast X-ray (mammogram) every year.  If you have a family history of breast cancer, talk to your health care provider about genetic screening.  If you are at high risk for breast cancer, talk to your health care provider about having an MRI and a mammogram every year.  Breast cancer gene (BRCA) assessment is recommended for women who have family members with BRCA-related cancers. BRCA-related cancers include:  Breast.  Ovarian.  Tubal.  Peritoneal cancers.  Results of the assessment will determine the need for genetic counseling and BRCA1 and BRCA2 testing. Cervical Cancer  Your health care provider may recommend that you be screened regularly for cancer of the pelvic organs (ovaries, uterus, and vagina). This screening involves a pelvic examination, including checking for microscopic changes to the surface of your cervix (Pap test). You may be encouraged to have this screening done every 3 years, beginning at age 75.  For women ages 69-65, health care providers may recommend pelvic exams and Pap testing every 3 years, or they may recommend the Pap and pelvic exam, combined with testing for human papilloma virus (HPV), every 5 years. Some types of HPV increase your risk of cervical cancer. Testing for HPV may also be done on women of any age with unclear Pap test results.  Other health care providers may not recommend any screening for nonpregnant women who are considered low risk for pelvic cancer and who do not have symptoms. Ask your health care provider if a screening pelvic exam is right for you.  If you have had past treatment for cervical cancer or a condition that could lead  to cancer, you need Pap tests and screening for  cancer for at least 20 years after your treatment. If Pap tests have been discontinued, your risk factors (such as having a new sexual partner) need to be reassessed to determine if screening should resume. Some women have medical problems that increase the chance of getting cervical cancer. In these cases, your health care provider may recommend more frequent screening and Pap tests. Colorectal Cancer  This type of cancer can be detected and often prevented.  Routine colorectal cancer screening usually begins at 72 years of age and continues through 73 years of age.  Your health care provider may recommend screening at an earlier age if you have risk factors for colon cancer.  Your health care provider may also recommend using home test kits to check for hidden blood in the stool.  A small camera at the end of a tube can be used to examine your colon directly (sigmoidoscopy or colonoscopy). This is done to check for the earliest forms of colorectal cancer.  Routine screening usually begins at age 24.  Direct examination of the colon should be repeated every 5-10 years through 72 years of age. However, you may need to be screened more often if early forms of precancerous polyps or small growths are found. Skin Cancer  Check your skin from head to toe regularly.  Tell your health care provider about any new moles or changes in moles, especially if there is a change in a mole's shape or color.  Also tell your health care provider if you have a mole that is larger than the size of a pencil eraser.  Always use sunscreen. Apply sunscreen liberally and repeatedly throughout the day.  Protect yourself by wearing long sleeves, pants, a wide-brimmed hat, and sunglasses whenever you are outside. Heart disease, diabetes, and high blood pressure  High blood pressure causes heart disease and increases the risk of stroke. High blood pressure is more  likely to develop in:  People who have blood pressure in the high end of the normal range (130-139/85-89 mm Hg).  People who are overweight or obese.  People who are African American.  If you are 23-4 years of age, have your blood pressure checked every 3-5 years. If you are 23 years of age or older, have your blood pressure checked every year. You should have your blood pressure measured twice-once when you are at a hospital or clinic, and once when you are not at a hospital or clinic. Record the average of the two measurements. To check your blood pressure when you are not at a hospital or clinic, you can use:  An automated blood pressure machine at a pharmacy.  A home blood pressure monitor.  If you are between 61 years and 64 years old, ask your health care provider if you should take aspirin to prevent strokes.  Have regular diabetes screenings. This involves taking a blood sample to check your fasting blood sugar level.  If you are at a normal weight and have a low risk for diabetes, have this test once every three years after 72 years of age.  If you are overweight and have a high risk for diabetes, consider being tested at a younger age or more often. Preventing infection Hepatitis B  If you have a higher risk for hepatitis B, you should be screened for this virus. You are considered at high risk for hepatitis B if:  You were born in a country where hepatitis B is common. Ask your health care provider which countries  are considered high risk.  Your parents were born in a high-risk country, and you have not been immunized against hepatitis B (hepatitis B vaccine).  You have HIV or AIDS.  You use needles to inject street drugs.  You live with someone who has hepatitis B.  You have had sex with someone who has hepatitis B.  You get hemodialysis treatment.  You take certain medicines for conditions, including cancer, organ transplantation, and autoimmune  conditions. Hepatitis C  Blood testing is recommended for:  Everyone born from [birth year] through 1966.  Anyone with known risk factors for hepatitis C. Sexually transmitted infections (STIs)  You should be screened for sexually transmitted infections (STIs) including gonorrhea and chlamydia if:  You are sexually active and are younger than 72 years of age.  You are older than 72 years of age and your health care provider tells you that you are at risk for this type of infection.  Your sexual activity has changed since you were last screened and you are at an increased risk for chlamydia or gonorrhea. Ask your health care provider if you are at risk.  If you do not have HIV, but are at risk, it may be recommended that you take a prescription medicine daily to prevent HIV infection. This is called pre-exposure prophylaxis (PrEP). You are considered at risk if:  You are sexually active and do not regularly use condoms or know the HIV status of your partner(s).  You take drugs by injection.  You are sexually active with a partner who has HIV. Talk with your health care provider about whether you are at high risk of being infected with HIV. If you choose to begin PrEP, you should first be tested for HIV. You should then be tested every 3 months for as long as you are taking PrEP. Pregnancy  If you are premenopausal and you may become pregnant, ask your health care provider about preconception counseling.  If you may become pregnant, take 400 to 800 micrograms (mcg) of folic acid every day.  If you want to prevent pregnancy, talk to your health care provider about birth control (contraception). Osteoporosis and menopause  Osteoporosis is a disease in which the bones lose minerals and strength with aging. This can result in serious bone fractures. Your risk for osteoporosis can be identified using a bone density scan.  If you are 76 years of age or older, or if you are at risk for  osteoporosis and fractures, ask your health care provider if you should be screened.  Ask your health care provider whether you should take a calcium or vitamin D supplement to lower your risk for osteoporosis.  Menopause may have certain physical symptoms and risks.  Hormone replacement therapy may reduce some of these symptoms and risks. Talk to your health care provider about whether hormone replacement therapy is right for you. Follow these instructions at home:  Schedule regular health, dental, and eye exams.  Stay current with your immunizations.  Do not use any tobacco products including cigarettes, chewing tobacco, or electronic cigarettes.  If you are pregnant, do not drink alcohol.  If you are breastfeeding, limit how much and how often you drink alcohol.  Limit alcohol intake to no more than 1 drink per day for nonpregnant women. One drink equals 12 ounces of beer, 5 ounces of wine, or 1 ounces of hard liquor.  Do not use street drugs.  Do not share needles.  Ask your health care provider for help  if you need support or information about quitting drugs.  Tell your health care provider if you often feel depressed.  Tell your health care provider if you have ever been abused or do not feel safe at home. This information is not intended to replace advice given to you by your health care provider. Make sure you discuss any questions you have with your health care provider. Document Released: 03/09/2011 Document Revised: 01/30/2016 Document Reviewed: 05/28/2015  2017 [employer]

## 2016-10-12 ENCOUNTER — Ambulatory Visit (INDEPENDENT_AMBULATORY_CARE_PROVIDER_SITE_OTHER): Payer: Medicare Other | Admitting: Family Medicine

## 2016-10-12 ENCOUNTER — Encounter: Payer: Self-pay | Admitting: Family Medicine

## 2016-10-12 VITALS — BP 120/83 | HR 59 | Temp 97.3°F | Resp 16 | Ht 61.5 in | Wt 134.0 lb

## 2016-10-12 DIAGNOSIS — Z1159 Encounter for screening for other viral diseases: Secondary | ICD-10-CM | POA: Diagnosis not present

## 2016-10-12 DIAGNOSIS — Z13220 Encounter for screening for lipoid disorders: Secondary | ICD-10-CM | POA: Diagnosis not present

## 2016-10-12 DIAGNOSIS — Z1239 Encounter for other screening for malignant neoplasm of breast: Secondary | ICD-10-CM

## 2016-10-12 DIAGNOSIS — Z1231 Encounter for screening mammogram for malignant neoplasm of breast: Secondary | ICD-10-CM

## 2016-10-12 DIAGNOSIS — Z130 Encounter for screening for diseases of the blood and blood-forming organs and certain disorders involving the immune mechanism: Secondary | ICD-10-CM | POA: Diagnosis not present

## 2016-10-12 DIAGNOSIS — Z6824 Body mass index (BMI) 24.0-24.9, adult: Secondary | ICD-10-CM

## 2016-10-12 DIAGNOSIS — M8580 Other specified disorders of bone density and structure, unspecified site: Secondary | ICD-10-CM

## 2016-10-12 DIAGNOSIS — E7800 Pure hypercholesterolemia, unspecified: Secondary | ICD-10-CM | POA: Diagnosis not present

## 2016-10-12 LAB — BASIC METABOLIC PANEL
BUN: 13 mg/dL (ref 6–23)
CALCIUM: 9.4 mg/dL (ref 8.4–10.5)
CO2: 30 meq/L (ref 19–32)
CREATININE: 0.68 mg/dL (ref 0.40–1.20)
Chloride: 97 mEq/L (ref 96–112)
GFR: 90.63 mL/min (ref >60.00)
GLUCOSE: 80 mg/dL (ref 70–99)
Potassium: 4.5 mEq/L (ref 3.5–5.1)
Sodium: 134 mEq/L — ABNORMAL LOW (ref 135–145)

## 2016-10-12 LAB — CBC WITH DIFFERENTIAL/PLATELET
BASOS PCT: 0.4 % (ref 0.0–3.0)
Basophils Absolute: 0 10*3/uL (ref 0.0–0.1)
EOS PCT: 1.5 % (ref 0.0–5.0)
Eosinophils Absolute: 0.1 10*3/uL (ref 0.0–0.7)
HCT: 39.4 % (ref 36.0–46.0)
HEMOGLOBIN: 13.5 g/dL (ref 12.0–15.0)
Lymphocytes Relative: 45.6 % (ref 12.0–46.0)
Lymphs Abs: 2 10*3/uL (ref 0.7–4.0)
MCHC: 34.2 g/dL (ref 30.0–36.0)
MCV: 101.7 fl — AB (ref 78.0–100.0)
MONOS PCT: 9.2 % (ref 3.0–12.0)
Monocytes Absolute: 0.4 10*3/uL (ref 0.1–1.0)
NEUTROS ABS: 1.9 10*3/uL (ref 1.4–7.7)
Neutrophils Relative %: 43.3 % (ref 43.0–77.0)
PLATELETS: 219 10*3/uL (ref 150.0–400.0)
RBC: 3.87 Mil/uL (ref 3.87–5.11)
RDW: 13.2 % (ref 11.5–15.5)
WBC: 4.4 10*3/uL (ref 4.0–10.5)

## 2016-10-12 LAB — LIPID PANEL
CHOL/HDL RATIO: 2
Cholesterol: 212 mg/dL — ABNORMAL HIGH (ref 0–200)
HDL: 101.2 mg/dL (ref >39.00)
LDL CALC: 100 mg/dL — AB (ref 0–99)
NONHDL: 111.14
Triglycerides: 55 mg/dL (ref 0.0–149.0)
VLDL: 11 mg/dL (ref 0.0–40.0)

## 2016-10-12 NOTE — Progress Notes (Signed)
Pre visit review using our clinic review tool, if applicable. No additional management support is needed unless otherwise documented below in the visit note. 

## 2016-10-12 NOTE — Progress Notes (Signed)
Patient ID: Sara Doyle, female  DOB: Jul 06, 1946, 71 y.o.   MRN: [id number] Patient Care Team    Relationship Specialty Notifications Start End  Ma Hillock, DO PCP - General Family Medicine  05/10/15   Milus Banister, MD Attending Physician Gastroenterology  10/05/16   Rutherford Guys, MD Consulting Physician Ophthalmology  10/05/16   Sallyanne Kuster  Dentistry  10/05/16     Subjective:  Sara Doyle is a 71 y.o.  female  present for osteopenia, elevated cholesterol.  All past medical history, surgical history, allergies, family history, immunizations, medications and social history were updated in the electronic medical record today. All recent labs, ED visits and hospitalizations within the last year were reviewed.  Health maintenance:  Colonoscopy: completed 2010, by Dr. Ardis Hughs, resutls normal. follow up 10 year. Personal hx of colon polyps.  Mammogram: completed:11/2015, birads 1. Yearly screen Fhx in sister Cervical cancer screening: > 70 N/A Immunizations: tdap UTD 2012, Influenza UTD 2017 (encouraged yearly), PNA series completed, zostavax completed Infectious disease screening:  Hep C agreed to testing today.  DEXA: last completed 2017, result osteopenia -1.6, follow needed 3 years Assistive device: none Oxygen [id number] Patient has a Dental home. Hospitalizations/ED visits: none  Immunization History  Administered Date(s) Administered  . Influenza-Unspecified 06/23/2015, 06/21/2016  . Pneumococcal Conjugate-13 02/12/2014  . Pneumococcal Polysaccharide-23 01/06/2011  . Tdap 01/06/2003     Past Medical History:  Diagnosis Date  . Allergy    Allergies  Allergen Reactions  . Demerol [Meperidine] Nausea And Vomiting  . Oysters [Shellfish Allergy]    Past Surgical History:  Procedure Laterality Date  . FRACTURE SURGERY  1969   right leg  . FRACTURE SURGERY  2005   right leg  . 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 TUBAL LIGATION      Family History  Problem Relation Age of Onset  . Hypertension Mother   . Arthritis Father     osteo  . Heart disease Brother     congenital  . Breast cancer Sister 7    breast  . Arthritis Sister   . Hypertension Sister   . Cancer Brother     prostate, colon  . Arrhythmia Brother     a-fib   Social History   Social History  . Marital status: Married    Spouse name: N/A  . Number of children: 3  . Years of education: N/A   Occupational History  .      retired   Social History Main Topics  . Smoking status: Never Smoker  . Smokeless tobacco: Never Used  . Alcohol use 2.4 oz/week    4 Glasses of wine per week     Comment: 4-5 glasses/week  . Drug use: No  . Sexual activity: Yes    Birth control/ protection: Post-menopausal   Other Topics Concern  . Not on file   Social History Narrative   Ms. Brungard lives with her husband, She relocated from [place] Nov., 2015. She is retired, has 3 grown sons that live in [place].   Smoke alarm in the home, wears her seatbelt, uses sunscreen.   Ambulates independently. No dentures.   Has dentist with regular appts.    Feels safe in her relationships   Allergies as of 10/12/2016      Reactions   Demerol [meperidine] Nausea And Vomiting   Oysters [shellfish Allergy]       Medication  List       Accurate as of 10/12/16 12:06 PM. Always use your most recent med list.          aspirin 81 MG tablet Take 81 mg by mouth daily.   Biotin 5000 MCG Tabs Take 5,000 mg by mouth.   CALCIUM 600 + D PO Take by mouth.        No results found for this or any previous visit (from the past 2160 hour(s)).    Mm Digital Screening Bilateral  Result Date: 11/29/2015 CLINICAL DATA:  Screening. EXAM: DIGITAL SCREENING BILATERAL MAMMOGRAM WITH CAD COMPARISON:  Previous exam(s). ACR Breast Density Category c: The breast tissue is heterogeneously dense, which may obscure small masses. FINDINGS: There are no findings suspicious for  malignancy. Images were processed with CAD. IMPRESSION: No mammographic evidence of malignancy. A result letter of this screening mammogram will be mailed directly to the patient. RECOMMENDATION: Screening mammogram in one year. (Code:SM-B-01Y) BI-RADS CATEGORY  1: Negative. Electronically Signed   By: Everlean Alstrom M.D.   On: 12/10/2015 07:50     ROS: 14 pt review of systems performed and negative (unless mentioned in an HPI)  Objective: BP 120/83 (BP Location: Right Leg, Patient Position: Sitting, Cuff Size: Normal)   Pulse (!) 59   Temp 97.3 F (36.3 C) (Oral)   Resp 16   Ht 5' 1.5" (1.562 m)   Wt 134 lb (60.8 kg)   SpO2 98%   BMI 24.91 kg/m  Gen: Afebrile. No acute distress. Nontoxic in appearance, well-developed, well-nourished,  Pleasant caucasian female.  HENT: AT. [place]. Bilateral TM visualized and normal in appearance, normal external auditory canal. MMM, no oral lesions, adequate dentition. Bilateral nares within normal limits. Throat without erythema, ulcerations or exudates. no Cough on exam, no hoarseness on exam. Eyes:Pupils Equal Round Reactive to light, Extraocular movements intact,  Conjunctiva without redness, discharge or icterus. Neck/lymp/endocrine: Supple,no lymphadenopathy, no thyromegaly CV: RRR, noedema, +2/4 P posterior tibialis pulses. No carotid bruits. No JVD. Chest: CTAB, no wheeze, rhonchi or crackles.  Abd: Soft. NTND. BS present. No  Masses palpated. No hepatosplenomegaly. No rebound tenderness or guarding. Skin: no  rashes, purpura or petechiae. Warm and well-perfused. Skin intact. Neuro/Msk:  Normal gait. PERLA. EOMi. Alert. Oriented x3.  Cranial nerves II through XII intact. Muscle strength 5/5 upper/lower extremity. DTRs equal bilaterally. Psych: Normal affect, dress and demeanor. Normal speech. Normal thought content and judgment.  Assessment/plan: Sara Doyle is a 71 y.o. female present for osteopenia and elvated cholesterol.  Screening for  deficiency anemia - CBC w/Diff  Osteopenia, unspecified location/BMI 24.0-24.9, adult - Pt to make certain she is consuming 1200 mg/1000u Ca/vitd daily.  - plenty of weight bearing exercise, which she is very good at doing.  - repeat dexa in 3 years  Screening cholesterol level/elevated cholesterol - mildly elevated total cholesterol, repeat today.  - Basic Metabolic Panel (BMET) - Lipid panel - high fiber, plenty of exercise. Consider fish oil supplement.   Encounter for hepatitis C screening test for low risk patient - Hepatitis C Antibody, pt agreeable to testing today.  Breast cancer screening, high risk patient - MM DIGITAL SCREENING BILATERAL; Future   Return in about 1 year (around 10/12/2017), or MW.  Electronically signed by: Howard Pouch, DO [place]

## 2016-10-12 NOTE — Patient Instructions (Signed)
Increase your vit d/ca make sure you getting 1200 mg calcium and 1000 u Vit d daily (via supplement and/or diet).   Health Maintenance, Female Introduction Adopting a healthy lifestyle and getting preventive care can go a long way to promote health and wellness. Talk with your health care provider about what schedule of regular examinations is right for you. This is a good chance for you to check in with your provider about disease prevention and staying healthy. In between checkups, there are plenty of things you can do on your own. Experts have done a lot of research about which lifestyle changes and preventive measures are most likely to keep you healthy. Ask your health care provider for more information. Weight and diet Eat a healthy diet  Be sure to include plenty of vegetables, fruits, low-fat dairy products, and lean protein.  Do not eat a lot of foods high in solid fats, added sugars, or salt.  Get regular exercise. This is one of the most important things you can do for your health.  Most adults should exercise for at least 150 minutes each week. The exercise should increase your heart rate and make you sweat (moderate-intensity exercise).  Most adults should also do strengthening exercises at least twice a week. This is in addition to the moderate-intensity exercise. Maintain a healthy weight  Body mass index (BMI) is a measurement that can be used to identify possible weight problems. It estimates body fat based on height and weight. Your health care provider can help determine your BMI and help you achieve or maintain a healthy weight.  For females 1 years of age and older:  A BMI below 18.5 is considered underweight.  A BMI of 18.5 to 24.9 is normal.  A BMI of 25 to 29.9 is considered overweight.  A BMI of 30 and above is considered obese. Watch levels of cholesterol and blood lipids  You should start having your blood tested for lipids and cholesterol at 72 years of  age, then have this test every 5 years.  You may need to have your cholesterol levels checked more often if:  Your lipid or cholesterol levels are high.  You are older than 72 years of age.  You are at high risk for heart disease. Cancer screening Lung Cancer  Lung cancer screening is recommended for adults 28-18 years old who are at high risk for lung cancer because of a history of smoking.  A yearly low-dose CT scan of the lungs is recommended for people who:  Currently smoke.  Have quit within the past 15 years.  Have at least a 30-pack-year history of smoking. A pack year is smoking an average of one pack of cigarettes a day for 1 year.  Yearly screening should continue until it has been 15 years since you quit.  Yearly screening should stop if you develop a health problem that would prevent you from having lung cancer treatment. Breast Cancer  Practice breast self-awareness. This means understanding how your breasts normally appear and feel.  It also means doing regular breast self-exams. Let your health care provider know about any changes, no matter how small.  If you are in your 20s or 30s, you should have a clinical breast exam (CBE) by a health care provider every 1-3 years as part of a regular health exam.  If you are 56 or older, have a CBE every year. Also consider having a breast X-ray (mammogram) every year.  If you have a family  history of breast cancer, talk to your health care provider about genetic screening.  If you are at high risk for breast cancer, talk to your health care provider about having an MRI and a mammogram every year.  Breast cancer gene (BRCA) assessment is recommended for women who have family members with BRCA-related cancers. BRCA-related cancers include:  Breast.  Ovarian.  Tubal.  Peritoneal cancers.  Results of the assessment will determine the need for genetic counseling and BRCA1 and BRCA2 testing. Cervical Cancer  Your  health care provider may recommend that you be screened regularly for cancer of the pelvic organs (ovaries, uterus, and vagina). This screening involves a pelvic examination, including checking for microscopic changes to the surface of your cervix (Pap test). You may be encouraged to have this screening done every 3 years, beginning at age 100.  For women ages 18-65, health care providers may recommend pelvic exams and Pap testing every 3 years, or they may recommend the Pap and pelvic exam, combined with testing for human papilloma virus (HPV), every 5 years. Some types of HPV increase your risk of cervical cancer. Testing for HPV may also be done on women of any age with unclear Pap test results.  Other health care providers may not recommend any screening for nonpregnant women who are considered low risk for pelvic cancer and who do not have symptoms. Ask your health care provider if a screening pelvic exam is right for you.  If you have had past treatment for cervical cancer or a condition that could lead to cancer, you need Pap tests and screening for cancer for at least 20 years after your treatment. If Pap tests have been discontinued, your risk factors (such as having a new sexual partner) need to be reassessed to determine if screening should resume. Some women have medical problems that increase the chance of getting cervical cancer. In these cases, your health care provider may recommend more frequent screening and Pap tests. Colorectal Cancer  This type of cancer can be detected and often prevented.  Routine colorectal cancer screening usually begins at 72 years of age and continues through 72 years of age.  Your health care provider may recommend screening at an earlier age if you have risk factors for colon cancer.  Your health care provider may also recommend using home test kits to check for hidden blood in the stool.  A small camera at the end of a tube can be used to examine your  colon directly (sigmoidoscopy or colonoscopy). This is done to check for the earliest forms of colorectal cancer.  Routine screening usually begins at age 41.  Direct examination of the colon should be repeated every 5-10 years through 72 years of age. However, you may need to be screened more often if early forms of precancerous polyps or small growths are found. Skin Cancer  Check your skin from head to toe regularly.  Tell your health care provider about any new moles or changes in moles, especially if there is a change in a mole's shape or color.  Also tell your health care provider if you have a mole that is larger than the size of a pencil eraser.  Always use sunscreen. Apply sunscreen liberally and repeatedly throughout the day.  Protect yourself by wearing long sleeves, pants, a wide-brimmed hat, and sunglasses whenever you are outside. Heart disease, diabetes, and high blood pressure  High blood pressure causes heart disease and increases the risk of stroke. High blood pressure  is more likely to develop in:  People who have blood pressure in the high end of the normal range (130-139/85-89 mm Hg).  People who are overweight or obese.  People who are African American.  If you are 64-64 years of age, have your blood pressure checked every 3-5 years. If you are 77 years of age or older, have your blood pressure checked every year. You should have your blood pressure measured twice-once when you are at a hospital or clinic, and once when you are not at a hospital or clinic. Record the average of the two measurements. To check your blood pressure when you are not at a hospital or clinic, you can use:  An automated blood pressure machine at a pharmacy.  A home blood pressure monitor.  If you are between 57 years and 97 years old, ask your health care provider if you should take aspirin to prevent strokes.  Have regular diabetes screenings. This involves taking a blood sample to  check your fasting blood sugar level.  If you are at a normal weight and have a low risk for diabetes, have this test once every three years after 72 years of age.  If you are overweight and have a high risk for diabetes, consider being tested at a younger age or more often. Preventing infection Hepatitis B  If you have a higher risk for hepatitis B, you should be screened for this virus. You are considered at high risk for hepatitis B if:  You were born in a country where hepatitis B is common. Ask your health care provider which countries are considered high risk.  Your parents were born in a high-risk country, and you have not been immunized against hepatitis B (hepatitis B vaccine).  You have HIV or AIDS.  You use needles to inject street drugs.  You live with someone who has hepatitis B.  You have had sex with someone who has hepatitis B.  You get hemodialysis treatment.  You take certain medicines for conditions, including cancer, organ transplantation, and autoimmune conditions. Hepatitis C  Blood testing is recommended for:  Everyone born from [birth year] through 1966.  Anyone with known risk factors for hepatitis C. Sexually transmitted infections (STIs)  You should be screened for sexually transmitted infections (STIs) including gonorrhea and chlamydia if:  You are sexually active and are younger than 72 years of age.  You are older than 72 years of age and your health care provider tells you that you are at risk for this type of infection.  Your sexual activity has changed since you were last screened and you are at an increased risk for chlamydia or gonorrhea. Ask your health care provider if you are at risk.  If you do not have HIV, but are at risk, it may be recommended that you take a prescription medicine daily to prevent HIV infection. This is called pre-exposure prophylaxis (PrEP). You are considered at risk if:  You are sexually active and do not regularly use  condoms or know the HIV status of your partner(s).  You take drugs by injection.  You are sexually active with a partner who has HIV. Talk with your health care provider about whether you are at high risk of being infected with HIV. If you choose to begin PrEP, you should first be tested for HIV. You should then be tested every 3 months for as long as you are taking PrEP. Pregnancy  If you are premenopausal and you may become  pregnant, ask your health care provider about preconception counseling.  If you may become pregnant, take 400 to 800 micrograms (mcg) of folic acid every day.  If you want to prevent pregnancy, talk to your health care provider about birth control (contraception). Osteoporosis and menopause  Osteoporosis is a disease in which the bones lose minerals and strength with aging. This can result in serious bone fractures. Your risk for osteoporosis can be identified using a bone density scan.  If you are 47 years of age or older, or if you are at risk for osteoporosis and fractures, ask your health care provider if you should be screened.  Ask your health care provider whether you should take a calcium or vitamin D supplement to lower your risk for osteoporosis.  Menopause may have certain physical symptoms and risks.  Hormone replacement therapy may reduce some of these symptoms and risks. Talk to your health care provider about whether hormone replacement therapy is right for you. Follow these instructions at home:  Schedule regular health, dental, and eye exams.  Stay current with your immunizations.  Do not use any tobacco products including cigarettes, chewing tobacco, or electronic cigarettes.  If you are pregnant, do not drink alcohol.  If you are breastfeeding, limit how much and how often you drink alcohol.  Limit alcohol intake to no more than 1 drink per day for nonpregnant women. One drink equals 12 ounces of beer, 5 ounces of wine, or 1 ounces of  hard liquor.  Do not use street drugs.  Do not share needles.  Ask your health care provider for help if you need support or information about quitting drugs.  Tell your health care provider if you often feel depressed.  Tell your health care provider if you have ever been abused or do not feel safe at home. This information is not intended to replace advice given to you by your health care provider. Make sure you discuss any questions you have with your health care provider. Document Released: 03/09/2011 Document Revised: 01/30/2016 Document Reviewed: 05/28/2015  2017 [employer]

## 2016-10-13 ENCOUNTER — Telehealth: Payer: Self-pay | Admitting: Family Medicine

## 2016-10-13 DIAGNOSIS — D7589 Other specified diseases of blood and blood-forming organs: Secondary | ICD-10-CM | POA: Insufficient documentation

## 2016-10-13 LAB — HEPATITIS C ANTIBODY: HCV AB: NEGATIVE

## 2016-10-13 NOTE — Telephone Encounter (Signed)
Patient notified and verbalized understanding. Patient will take supplements as instructed and follow up in 6 months.

## 2016-10-13 NOTE — Telephone Encounter (Signed)
Please call pt: - her blood work looks stable from prior physicals.  - she does have very mildly enlarged blood cells, this was present a few years ago when looking back in her records. It is very mild and usually seen in persons with lower B12 levels, folate deficiency and/or persons whom consume a more than 2 alcoholic beverages a day. I would recommend she take a B complex vitamin and a multivitamin. This is very mild, we can continue to watch for now. If she desires further studies we can consider sending her to a hematologist.  - folate rich foods are green leafy veggies, beans, nuts, peas, bananas, melons strawberries.  - we could also test for the above causes, but I received the medicare red box hard stop when ordering, suggesting they would not cover test.  - F/U 6 months if desires retesting after starting above supplements.

## 2016-11-30 ENCOUNTER — Ambulatory Visit
Admission: RE | Admit: 2016-11-30 | Discharge: 2016-11-30 | Disposition: A | Discharge status: Home / Self Care | Payer: Medicare Other | Source: Ambulatory Visit | Attending: Family Medicine | Admitting: Family Medicine

## 2016-11-30 DIAGNOSIS — Z1239 Encounter for other screening for malignant neoplasm of breast: Secondary | ICD-10-CM

## 2016-11-30 DIAGNOSIS — Z1231 Encounter for screening mammogram for malignant neoplasm of breast: Secondary | ICD-10-CM | POA: Diagnosis not present

## 2017-01-18 ENCOUNTER — Encounter: Payer: Self-pay | Admitting: Family Medicine

## 2017-01-27 ENCOUNTER — Ambulatory Visit (INDEPENDENT_AMBULATORY_CARE_PROVIDER_SITE_OTHER): Payer: Medicare Other | Admitting: Family Medicine

## 2017-01-27 ENCOUNTER — Encounter: Payer: Self-pay | Admitting: Family Medicine

## 2017-01-27 VITALS — BP 101/68 | HR 72 | Temp 97.9°F | Resp 20 | Wt 134.2 lb

## 2017-01-27 DIAGNOSIS — S76911A Strain of unspecified muscles, fascia and tendons at thigh level, right thigh, initial encounter: Secondary | ICD-10-CM

## 2017-01-27 HISTORY — DX: Strain of unspecified muscles, fascia and tendons at thigh level, right thigh, initial encounter: S76.911A

## 2017-01-27 NOTE — Progress Notes (Signed)
Sara Doyle , [birth year], 71 y.o., female MRN: [id number] Patient Care Team    Relationship Specialty Notifications Start End  Ma Hillock, DO PCP - General Family Medicine  05/10/15   Milus Banister, MD Attending Physician Gastroenterology  10/05/16   Rutherford Guys, MD Consulting Physician Ophthalmology  10/05/16   Sallyanne Kuster  Dentistry  10/05/16     Chief Complaint  Patient presents with  . Knee Pain    right injured on eliptical      Subjective: Pt presents for an OV with complaints of Right medial knee and medial thigh/groin pain of 2 months duration.  Associated symptoms include a constant strain/pole sensation medial right thigh, worse with exercise, better with sitting. She reports approximately 2 months ago she felt a pull or strain in her right medial groin while on the elliptical machine. Since that time she continues to have discomfort and is becoming frustrated as to why the area is causing her such difficulty. Patient works out very routinely. She denies prior injury to this area. She denies any numbness, tingling or bruising. She reports she went on vacation, and in that time was only walking and the pain has improved. So when she came back from a three-week vacation, she started back into her routine exercise regimen and pain restarted. She takes aspirin 325 when pain occurs. She does not want to take a daily medication.  Depression screen [facility] 2/9 10/05/2016 10/04/2015 10/04/2015  Decreased Interest 0 0 0  Down, Depressed, Hopeless 0 0 0  PHQ - 2 Score 0 0 0    Allergies  Allergen Reactions  . Demerol [Meperidine] Nausea And Vomiting  . Oysters [employer] Allergy]    Social History  Substance Use Topics  . Smoking status: Never Smoker  . Smokeless tobacco: Never Used  . Alcohol use 2.4 oz/week    4 Glasses of wine per week     Comment: 4-5 glasses/week   Past Medical History:  Diagnosis Date  . Allergy    Past Surgical History:  Procedure Laterality  Date  . FRACTURE SURGERY  1969   right leg  . FRACTURE SURGERY  2005   right leg  . 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 TUBAL LIGATION     Family History  Problem Relation Age of Onset  . Hypertension Mother   . Arthritis Father        osteo  . Heart disease Brother        congenital  . Breast cancer Sister 61       breast  . Arthritis Sister   . Hypertension Sister   . Cancer Brother        prostate, colon  . Arrhythmia Brother        a-fib   Allergies as of 01/27/2017      Reactions   Demerol [meperidine] Nausea And Vomiting   Oysters [shellfish Allergy]       Medication List       Accurate as of 01/27/17  2:32 PM. Always use your most recent med list.          aspirin 81 MG tablet Take 81 mg by mouth daily.   Biotin 5000 MCG Tabs Take 5,000 mg by mouth.   CALCIUM 600 + D PO Take 2 tablets by mouth.       All past medical history, surgical history, allergies, family history, immunizations andmedications were updated in the EMR  today and reviewed under the history and medication portions of their EMR.     ROS: Negative, with the exception of above mentioned in HPI   Objective:  BP 101/68 (BP Location: Left Arm, Patient Position: Sitting, Cuff Size: Normal)   Pulse 72   Temp 97.9 F (36.6 C)   Resp 20   Wt 134 lb 4 oz (60.9 kg)   SpO2 96%   BMI 24.96 kg/m  Body mass index is 24.96 kg/m. Gen: Afebrile. No acute distress. Nontoxic in appearance, well developed, well nourished. Very pleasant Caucasian female. MSK: No erythema, no soft tissue swelling, no bruising. No tenderness to palpation. Mild discomfort with hip flexion, external rotation at groin. No jointline tenderness of the knee, no effusion or swelling. Skin: No rashes, purpura or petechiae.  Neuro: Normal gait. PERLA. EOMi. Alert. Oriented x3   No exam data present No results found. No results found for this or any previous visit (from the past 24  hour(s)).  Assessment/Plan: Sara Doyle is a 71 y.o. female present for OV for  1. Muscle strain of thigh, right, initial encounter - Discussed muscle anatomy of medial thigh, exam and presentation was consistent with gracillus muscle strain. - NSAIDs, ice, rest strongly encouraged. Patient does not necessarily want to take NSAIDs, and only wants to use aspirin when necessary. Patient was educated on taking it slow, and incorporating light stretches using pain as her guide. Strain in this area can take quite a few weeks to completely heal. Would discourage any weights or resistance for at least 3-4 weeks for this area. - Discussed physical therapy, referral to sports medicine. She would like to hold on these options at this time.  - Follow-up 3-4 weeks if not improving, sooner if worsening.   Reviewed expectations re: course of current medical issues.  Discussed self-management of symptoms.  Outlined signs and symptoms indicating need for more acute intervention.  Patient verbalized understanding and all questions were answered.  Patient received an After-Visit Summary.    Note is dictated utilizing voice recognition software. Although note has been proof read prior to signing, occasional typographical errors still can be missed. If any questions arise, please do not hesitate to call for verification.   electronically signed by:  Howard Pouch, DO  [place]

## 2017-01-27 NOTE — Patient Instructions (Signed)
Nsaids, stretch but rest the muscle without resistance for a few weeks. These can take a while to heal.  Ice after workout is ok and can be good.  If this does not resolve or and you decide you would like referral to specialist or physical therapy.  Quadriceps Strain Rehab Ask your health care provider which exercises are safe for you. Do exercises exactly as told by your health care provider and adjust them as directed. It is normal to feel mild stretching, pulling, tightness, or discomfort as you do these exercises, but you should stop right away if you feel sudden pain or your pain gets worse.Do not begin these exercises until told by your health care provider. Stretching and range of motion exercises These exercises warm up your muscles and joints and improve the movement and flexibility of your thigh. These exercises can also help to relieve stiffness or swelling. Exercise A: Heel slides   1. Lie on your back with both knees straight. If this causes back discomfort, bend the knee of your healthy leg, placing your foot flat on the floor. 2. Slowly slide your left / right heel back toward your buttocks until you feel a gentle stretch in the front of your knee or thigh. 3. Hold for __________ seconds. Then slowly slide your heel back to the starting position. Repeat __________ times. Complete this exercise __________ times a day. Exercise B: Quadriceps stretch, prone   1. Lie on your abdomen on a firm surface, such as a bed or padded floor. 2. Bend your left / right knee and hold your ankle. If you cannot reach your ankle or pant leg, loop a belt around your foot and grab the belt instead. 3. Gently pull your heel toward your buttocks. Your knee should not slide out to the side. You should feel a stretch in the front of your thigh and knee. 4. Hold this position for __________ seconds. Repeat __________ times. Complete this exercise __________ times a day. Strengthening exercises These  exercises build strength and endurance in your thigh. Endurance is the ability to use your muscles for a long time, even after your muscles get tired. Exercise C: Straight leg raises (  quadriceps and hip flexors) Quality counts! Watch for signs that the quadriceps muscle is working to ensure that you are strengthening the correct muscles and not cheating by using healthier muscles. 1. Lie on your back with your left / right leg extended and your other knee bent. 2. Tense the muscles in the front of your left / right thigh. You should see your kneecap slide up or see increased dimpling just above the knee. 3. Tighten these muscles even more and raise your leg 4-6 inches (10-15 cm) off the floor. 4. Hold for __________ seconds. 5. Keep the thigh muscles tense as you lower your leg. 6. Relax the muscles slowly and completely after each repetition. Repeat __________ times. Complete this exercise __________ times a day. Exercise D: Straight leg raises ( hip extensors) 1. Lie on your belly on a bed or a firm surface with a pillow under your hips. 2. Bend your left / right knee so your foot is straight up in the air. 3. Tense your buttock muscles and lift your left / right thigh off the bed. Do not let your back arch. 4. Hold this position for __________ seconds. 5. Slowly return to the starting position. Let your muscles relax completely before doing another repetition. Repeat __________ times. Complete this exercise __________ times  a day. Exercise E: Wall sits  Follow the directions for form closely. If you do not place your feet and knees properly, this can lead to knee pain. 1. Lean back against a smooth wall or door and walk your feet out 18-24 inches (46-61 cm) from it. Place your feet hip-width apart. 2. Slowly slide down the wall or door until your knees bend __________ degrees. Keep your weight back and over your heels, not over your toes. Keep your thighs straight or pointing slightly  outward. 3. Hold for __________ seconds. 4. Use your thigh and buttock muscles to push you back up to a standing position. Keep your weight through your heels while you do this. 5. Rest for __________ seconds in between repetitions. Repeat __________ times. Complete this exercise __________ times a day. This information is not intended to replace advice given to you by your health care provider. Make sure you discuss any questions you have with your health care provider. Document Released: 08/24/2005 Document Revised: 04/30/2016 Document Reviewed: 05/28/2015 Elsevier Interactive Patient Education  2017 [employer].

## 2017-04-13 ENCOUNTER — Ambulatory Visit (INDEPENDENT_AMBULATORY_CARE_PROVIDER_SITE_OTHER): Payer: Medicare Other | Admitting: Family Medicine

## 2017-04-13 ENCOUNTER — Encounter: Payer: Self-pay | Admitting: Family Medicine

## 2017-04-13 VITALS — BP 130/90 | Ht 61.5 in | Wt 127.2 lb

## 2017-04-13 DIAGNOSIS — M25551 Pain in right hip: Secondary | ICD-10-CM | POA: Diagnosis not present

## 2017-04-13 DIAGNOSIS — R269 Unspecified abnormalities of gait and mobility: Secondary | ICD-10-CM

## 2017-04-13 DIAGNOSIS — T148XXA Other injury of unspecified body region, initial encounter: Secondary | ICD-10-CM | POA: Diagnosis not present

## 2017-04-13 MED ORDER — MELOXICAM 15 MG PO TABS: 15.0000 mg | ORAL_TABLET | Freq: Every day | ORAL | 0 refills | Status: DC

## 2017-04-13 NOTE — Patient Instructions (Signed)
Please get your X rays in the next couple of days at your conveneence. Please take one meloxicam daily with food for two weeks. Let me see you in about two weeks.  Please call with any new or worsening symptoms or with questions. Nice to meet you!

## 2017-04-13 NOTE — Progress Notes (Signed)
    CHIEF COMPLAINT / HPI:  In March she noticed acute onset s of anterior right hip pain as well as medial knee pain///'. Noticed while doing elliptical---has been doing that machine for years, no change in regimen. Pain has not improved despite stopping elliptical, using tylenol, doing some exercises her PCP gave her..  Aching in nature. Has noticed a gait change in that her right leg and foot seem turned out but that is only way she is comfortable walking / Standing from seated position. Nothing has improved pain. Interfering with her activities. Cannot do elliptical but OK with treadmill (no elevation). Cannot hike hills.  Has scheduled vacation to [place] in 6 weeks. Wants conservative treatment if possible.  REVIEW OF SYSTEMS:  Pertinent review of systems: negative for fever or unusual weight change. No numbness or weakness of LE. No skin changes. No other unusual arthralgias or myalgias. PERTINENT  PMH / PSH: I have reviewed the patient's medications, allergies, past medical and surgical history, smoking status.  Pertinent findings that relate to today's visit / issues include: Hx 2 orthopedic surgeries on RLE (tib fib fracture and some other type RLE surgery. Nonsmoker No personal hx DM FH negative for deforming arthropathies    OBJECTIVE:  Vital signs are reviewed.   GEN: WDWNNAD HIPS: FROM. Mild pain with ER right hip but FROM. Negative axial loading. FADIR/FABER normal.  KNEE: mild TTP insertion of sartorius muscle. No pain at joint lone. No effusion. SKIN: no eryhema or lesions in groin or knee area.. ligamentously intact.  VASC: DP pulses 2+ B= NEURO: sensation intact distally to soft touch. 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  ASSESSMENT / PLAN:  1. Groin pain   2. sott tissue pain proximal to right knee medially 3.. 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 I thin we will see some significant DJD right hip. Hopefully the NSAIDS wil improve pain. May benefit from change in activities (decrease elliptical, decrease hills) log term. If X ray does NOT reveal this, then would consider MRI of hip/groin.  Kidney function reviewed and normal. No hx GI bleeds. Precautions given F/u 2 weeks

## 2017-04-15 ENCOUNTER — Ambulatory Visit
Admission: RE | Admit: 2017-04-15 | Discharge: 2017-04-15 | Disposition: A | Discharge status: Home / Self Care | Payer: Medicare Other | Source: Ambulatory Visit | Attending: Family Medicine | Admitting: Family Medicine

## 2017-04-15 ENCOUNTER — Other Ambulatory Visit: Payer: Self-pay | Admitting: Family Medicine

## 2017-04-15 DIAGNOSIS — M25551 Pain in right hip: Secondary | ICD-10-CM

## 2017-04-15 DIAGNOSIS — M160 Bilateral primary osteoarthritis of hip: Secondary | ICD-10-CM | POA: Diagnosis not present

## 2017-04-20 ENCOUNTER — Encounter: Payer: Self-pay | Admitting: Family Medicine

## 2017-05-04 ENCOUNTER — Encounter: Payer: Self-pay | Admitting: Family Medicine

## 2017-05-04 ENCOUNTER — Ambulatory Visit (INDEPENDENT_AMBULATORY_CARE_PROVIDER_SITE_OTHER): Payer: Medicare Other | Admitting: Family Medicine

## 2017-05-04 VITALS — BP 124/70 | Ht 61.5 in | Wt 127.2 lb

## 2017-05-04 DIAGNOSIS — M1611 Unilateral primary osteoarthritis, right hip: Secondary | ICD-10-CM | POA: Diagnosis present

## 2017-05-05 ENCOUNTER — Encounter: Payer: Self-pay | Admitting: Family Medicine

## 2017-05-05 DIAGNOSIS — M1611 Unilateral primary osteoarthritis, right hip: Secondary | ICD-10-CM | POA: Insufficient documentation

## 2017-05-05 NOTE — Assessment & Plan Note (Signed)
Long discussion about hip OA. She is doing well so I hope she can continue most of her activities. I made some recommendations: avoid adding incline to elliptical ( as this was most likely inciting factor for this episode) Discussed  She may have other episodes. Potentially she x\could have quick decline in function but I doubt it. Discussed OTC aleve / mobic etc.  Greater than 50% of our 25 minute office visit was spent in counseling and education regarding these issues.RTC PRN.

## 2017-05-05 NOTE — Progress Notes (Signed)
    CHIEF COMPLAINT / HPI:  F/u RIGHT hip pain.  Much improved. Thought the Mobic was very useful. No side effects. Took as directed. Pain is 90-95% resolved. Has questions about her x rays and about future activities and treatments.  REVIEW OF SYSTEMS:  Pertinent review of systems: negative for fever or unusual weight change. See HPI.   PERTINENT  PMH / PSH: I have reviewed the patient's medications, allergies, past medical and surgical history, smoking status.  Pertinent findings that relate to today's visit / issues include: Prior leg fracture on right lower leg X 2; one whiile skiing (sounds like tib fib fx) and another years later (possible tibial plateau fx) OBJECTIVE:  Vital signs are reviewed.   GEN: WD WN NAD HIPS: FROM Bilaterally that is painless.  IMAGING reviewed with her visually. Severe joint space loss in right hip joint. Mild to moderate on left  ASSESSMENT / PLAN: Please see problem oriented charting for details

## 2017-05-14 DIAGNOSIS — Z23 Encounter for immunization: Secondary | ICD-10-CM | POA: Diagnosis not present

## 2017-07-08 HISTORY — PX: SKIN LESION EXCISION: SHX2412

## 2017-08-03 DIAGNOSIS — L820 Inflamed seborrheic keratosis: Secondary | ICD-10-CM | POA: Diagnosis not present

## 2017-08-03 DIAGNOSIS — Z23 Encounter for immunization: Secondary | ICD-10-CM | POA: Diagnosis not present

## 2017-08-03 DIAGNOSIS — L719 Rosacea, unspecified: Secondary | ICD-10-CM | POA: Diagnosis not present

## 2017-08-03 DIAGNOSIS — D485 Neoplasm of uncertain behavior of skin: Secondary | ICD-10-CM | POA: Diagnosis not present

## 2017-10-11 ENCOUNTER — Ambulatory Visit (INDEPENDENT_AMBULATORY_CARE_PROVIDER_SITE_OTHER): Payer: Medicare Other

## 2017-10-11 ENCOUNTER — Other Ambulatory Visit: Payer: Self-pay

## 2017-10-11 VITALS — BP 104/58 | HR 48 | Ht 62.0 in | Wt 132.8 lb

## 2017-10-11 DIAGNOSIS — Z23 Encounter for immunization: Secondary | ICD-10-CM

## 2017-10-11 DIAGNOSIS — Z0000 Encounter for general adult medical examination without abnormal findings: Secondary | ICD-10-CM | POA: Diagnosis not present

## 2017-10-11 MED ORDER — ZOSTER VAC RECOMB ADJUVANTED 50 MCG/0.5ML IM SUSR: 0.5000 mL | Freq: Once | INTRAMUSCULAR | 1 refills | Status: AC

## 2017-10-11 NOTE — Patient Instructions (Addendum)
Shingles vaccine at pharmacy.   Continue doing brain stimulating activities (puzzles, reading, adult coloring books, staying active) to keep memory sharp.   Bring a copy of your living will and/or healthcare power of attorney to your next office visit.   Fall Prevention in the Home Falls can cause injuries. They can happen to people of all ages. There are many things you can do to make your home safe and to help prevent falls. What can I do on the outside of my home?  Regularly fix the edges of walkways and driveways and fix any cracks.  Remove anything that might make you trip as you walk through a door, such as a raised step or threshold.  Trim any bushes or trees on the path to your home.  Use bright outdoor lighting.  Clear any walking paths of anything that might make someone trip, such as rocks or tools.  Regularly check to see if handrails are loose or broken. Make sure that both sides of any steps have handrails.  Any raised decks and porches should have guardrails on the edges.  Have any leaves, snow, or ice cleared regularly.  Use sand or salt on walking paths during winter.  Clean up any spills in your garage right away. This includes oil or grease spills. What can I do in the bathroom?  Use night lights.  Install grab bars by the toilet and in the tub and shower. Do not use towel bars as grab bars.  Use non-skid mats or decals in the tub or shower.  If you need to sit down in the shower, use a plastic, non-slip stool.  Keep the floor dry. Clean up any water that spills on the floor as soon as it happens.  Remove soap buildup in the tub or shower regularly.  Attach bath mats securely with double-sided non-slip rug tape.  Do not have throw rugs and other things on the floor that can make you trip. What can I do in the bedroom?  Use night lights.  Make sure that you have a light by your bed that is easy to reach.  Do not use any sheets or blankets that  are too big for your bed. They should not hang down onto the floor.  Have a firm chair that has side arms. You can use this for support while you get dressed.  Do not have throw rugs and other things on the floor that can make you trip. What can I do in the kitchen?  Clean up any spills right away.  Avoid walking on wet floors.  Keep items that you use a lot in easy-to-reach places.  If you need to reach something above you, use a strong step stool that has a grab bar.  Keep electrical cords out of the way.  Do not use floor polish or wax that makes floors slippery. If you must use wax, use non-skid floor wax.  Do not have throw rugs and other things on the floor that can make you trip. What can I do with my stairs?  Do not leave any items on the stairs.  Make sure that there are handrails on both sides of the stairs and use them. Fix handrails that are broken or loose. Make sure that handrails are as long as the stairways.  Check any carpeting to make sure that it is firmly attached to the stairs. Fix any carpet that is loose or worn.  Avoid having throw rugs at the top   or bottom of the stairs. If you do have throw rugs, attach them to the floor with carpet tape.  Make sure that you have a light switch at the top of the stairs and the bottom of the stairs. If you do not have them, ask someone to add them for you. What else can I do to help prevent falls?  Wear shoes that: ? Do not have high heels. ? Have rubber bottoms. ? Are comfortable and fit you well. ? Are closed at the toe. Do not wear sandals.  If you use a stepladder: ? Make sure that it is fully opened. Do not climb a closed stepladder. ? Make sure that both sides of the stepladder are locked into place. ? Ask someone to hold it for you, if possible.  Clearly mark and make sure that you can see: ? Any grab bars or handrails. ? First and last steps. ? Where the edge of each step is.  Use tools that help you  move around (mobility aids) if they are needed. These include: ? Canes. ? Walkers. ? Scooters. ? Crutches.  Turn on the lights when you go into a dark area. Replace any light bulbs as soon as they burn out.  Set up your furniture so you have a clear path. Avoid moving your furniture around.  If any of your floors are uneven, fix them.  If there are any pets around you, be aware of where they are.  Review your medicines with your doctor. Some medicines can make you feel dizzy. This can increase your chance of falling. Ask your doctor what other things that you can do to help prevent falls. This information is not intended to replace advice given to you by your health care provider. Make sure you discuss any questions you have with your health care provider. Document Released: 06/20/2009 Document Revised: 01/30/2016 Document Reviewed: 09/28/2014 Elsevier Interactive Patient Education  2018 [employer]  Health Maintenance, Female Adopting a healthy lifestyle and getting preventive care can go a long way to promote health and wellness. Talk with your health care provider about what schedule of regular examinations is right for you. This is a good chance for you to check in with your provider about disease prevention and staying healthy. In between checkups, there are plenty of things you can do on your own. Experts have done a lot of research about which lifestyle changes and preventive measures are most likely to keep you healthy. Ask your health care provider for more information. Weight and diet Eat a healthy diet  Be sure to include plenty of vegetables, fruits, low-fat dairy products, and lean protein.  Do not eat a lot of foods high in solid fats, added sugars, or salt.  Get regular exercise. This is one of the most important things you can do for your health. ? Most adults should exercise for at least 150 minutes each week. The exercise should increase your heart rate and make  you sweat (moderate-intensity exercise). ? Most adults should also do strengthening exercises at least twice a week. This is in addition to the moderate-intensity exercise.  Maintain a healthy weight  Body mass index (BMI) is a measurement that can be used to identify possible weight problems. It estimates body fat based on height and weight. Your health care provider can help determine your BMI and help you achieve or maintain a healthy weight.  For females 20 years of age and older: ? A BMI below 18.5 is   considered underweight. ? A BMI of 18.5 to 24.9 is normal. ? A BMI of 25 to 29.9 is considered overweight. ? A BMI of 30 and above is considered obese.  Watch levels of cholesterol and blood lipids  You should start having your blood tested for lipids and cholesterol at 73 years of age, then have this test every 5 years.  You may need to have your cholesterol levels checked more often if: ? Your lipid or cholesterol levels are high. ? You are older than 73 years of age. ? You are at high risk for heart disease.  Cancer screening Lung Cancer  Lung cancer screening is recommended for adults 32-16 years old who are at high risk for lung cancer because of a history of smoking.  A yearly low-dose CT scan of the lungs is recommended for people who: ? Currently smoke. ? Have quit within the past 15 years. ? Have at least a 30-pack-year history of smoking. A pack year is smoking an average of one pack of cigarettes a day for 1 year.  Yearly screening should continue until it has been 15 years since you quit.  Yearly screening should stop if you develop a health problem that would prevent you from having lung cancer treatment.  Breast Cancer  Practice breast self-awareness. This means understanding how your breasts normally appear and feel.  It also means doing regular breast self-exams. Let your health care provider know about any changes, no matter how small.  If you are in your  20s or 30s, you should have a clinical breast exam (CBE) by a health care provider every 1-3 years as part of a regular health exam.  If you are 25 or older, have a CBE every year. Also consider having a breast X-ray (mammogram) every year.  If you have a family history of breast cancer, talk to your health care provider about genetic screening.  If you are at high risk for breast cancer, talk to your health care provider about having an MRI and a mammogram every year.  Breast cancer gene (BRCA) assessment is recommended for women who have family members with BRCA-related cancers. BRCA-related cancers include: ? Breast. ? Ovarian. ? Tubal. ? Peritoneal cancers.  Results of the assessment will determine the need for genetic counseling and BRCA1 and BRCA2 testing.  Cervical Cancer Your health care provider may recommend that you be screened regularly for cancer of the pelvic organs (ovaries, uterus, and vagina). This screening involves a pelvic examination, including checking for microscopic changes to the surface of your cervix (Pap test). You may be encouraged to have this screening done every 3 years, beginning at age 50.  For women ages 9-65, health care providers may recommend pelvic exams and Pap testing every 3 years, or they may recommend the Pap and pelvic exam, combined with testing for human papilloma virus (HPV), every 5 years. Some types of HPV increase your risk of cervical cancer. Testing for HPV may also be done on women of any age with unclear Pap test results.  Other health care providers may not recommend any screening for nonpregnant women who are considered low risk for pelvic cancer and who do not have symptoms. Ask your health care provider if a screening pelvic exam is right for you.  If you have had past treatment for cervical cancer or a condition that could lead to cancer, you need Pap tests and screening for cancer for at least 20 years after your treatment. If Pap  tests have been discontinued, your risk factors (such as having a new sexual partner) need to be reassessed to determine if screening should resume. Some women have medical problems that increase the chance of getting cervical cancer. In these cases, your health care provider may recommend more frequent screening and Pap tests.  Colorectal Cancer  This type of cancer can be detected and often prevented.  Routine colorectal cancer screening usually begins at 73 years of age and continues through 73 years of age.  Your health care provider may recommend screening at an earlier age if you have risk factors for colon cancer.  Your health care provider may also recommend using home test kits to check for hidden blood in the stool.  A small camera at the end of a tube can be used to examine your colon directly (sigmoidoscopy or colonoscopy). This is done to check for the earliest forms of colorectal cancer.  Routine screening usually begins at age 50.  Direct examination of the colon should be repeated every 5-10 years through 73 years of age. However, you may need to be screened more often if early forms of precancerous polyps or small growths are found.  Skin Cancer  Check your skin from head to toe regularly.  Tell your health care provider about any new moles or changes in moles, especially if there is a change in a mole's shape or color.  Also tell your health care provider if you have a mole that is larger than the size of a pencil eraser.  Always use sunscreen. Apply sunscreen liberally and repeatedly throughout the day.  Protect yourself by wearing long sleeves, pants, a wide-brimmed hat, and sunglasses whenever you are outside.  Heart disease, diabetes, and high blood pressure  High blood pressure causes heart disease and increases the risk of stroke. High blood pressure is more likely to develop in: ? People who have blood pressure in the high end of the normal range  (130-139/85-89 mm Hg). ? People who are overweight or obese. ? People who are African American.  If you are 18-39 years of age, have your blood pressure checked every 3-5 years. If you are 40 years of age or older, have your blood pressure checked every year. You should have your blood pressure measured twice-once when you are at a hospital or clinic, and once when you are not at a hospital or clinic. Record the average of the two measurements. To check your blood pressure when you are not at a hospital or clinic, you can use: ? An automated blood pressure machine at a pharmacy. ? A home blood pressure monitor.  If you are between 55 years and 79 years old, ask your health care provider if you should take aspirin to prevent strokes.  Have regular diabetes screenings. This involves taking a blood sample to check your fasting blood sugar level. ? If you are at a normal weight and have a low risk for diabetes, have this test once every three years after 73 years of age. ? If you are overweight and have a high risk for diabetes, consider being tested at a younger age or more often. Preventing infection Hepatitis B  If you have a higher risk for hepatitis B, you should be screened for this virus. You are considered at high risk for hepatitis B if: ? You were born in a country where hepatitis B is common. Ask your health care provider which countries are considered high risk. ? Your parents were   born in a high-risk country, and you have not been immunized against hepatitis B (hepatitis B vaccine). ? You have HIV or AIDS. ? You use needles to inject street drugs. ? You live with someone who has hepatitis B. ? You have had sex with someone who has hepatitis B. ? You get hemodialysis treatment. ? You take certain medicines for conditions, including cancer, organ transplantation, and autoimmune conditions.  Hepatitis C  Blood testing is recommended for: ? Everyone born from [birth year] through  1966. ? Anyone with known risk factors for hepatitis C.  Sexually transmitted infections (STIs)  You should be screened for sexually transmitted infections (STIs) including gonorrhea and chlamydia if: ? You are sexually active and are younger than 73 years of age. ? You are older than 73 years of age and your health care provider tells you that you are at risk for this type of infection. ? Your sexual activity has changed since you were last screened and you are at an increased risk for chlamydia or gonorrhea. Ask your health care provider if you are at risk.  If you do not have HIV, but are at risk, it may be recommended that you take a prescription medicine daily to prevent HIV infection. This is called pre-exposure prophylaxis (PrEP). You are considered at risk if: ? You are sexually active and do not regularly use condoms or know the HIV status of your partner(s). ? You take drugs by injection. ? You are sexually active with a partner who has HIV.  Talk with your health care provider about whether you are at high risk of being infected with HIV. If you choose to begin PrEP, you should first be tested for HIV. You should then be tested every 3 months for as long as you are taking PrEP. Pregnancy  If you are premenopausal and you may become pregnant, ask your health care provider about preconception counseling.  If you may become pregnant, take 400 to 800 micrograms (mcg) of folic acid every day.  If you want to prevent pregnancy, talk to your health care provider about birth control (contraception). Osteoporosis and menopause  Osteoporosis is a disease in which the bones lose minerals and strength with aging. This can result in serious bone fractures. Your risk for osteoporosis can be identified using a bone density scan.  If you are 77 years of age or older, or if you are at risk for osteoporosis and fractures, ask your health care provider if you should be screened.  Ask your health  care provider whether you should take a calcium or vitamin D supplement to lower your risk for osteoporosis.  Menopause may have certain physical symptoms and risks.  Hormone replacement therapy may reduce some of these symptoms and risks. Talk to your health care provider about whether hormone replacement therapy is right for you. Follow these instructions at home:  Schedule regular health, dental, and eye exams.  Stay current with your immunizations.  Do not use any tobacco products including cigarettes, chewing tobacco, or electronic cigarettes.  If you are pregnant, do not drink alcohol.  If you are breastfeeding, limit how much and how often you drink alcohol.  Limit alcohol intake to no more than 1 drink per day for nonpregnant women. One drink equals 12 ounces of beer, 5 ounces of wine, or 1 ounces of hard liquor.  Do not use street drugs.  Do not share needles.  Ask your health care provider for help if you need support or  information about quitting drugs.  Tell your health care provider if you often feel depressed.  Tell your health care provider if you have ever been abused or do not feel safe at home. This information is not intended to replace advice given to you by your health care provider. Make sure you discuss any questions you have with your health care provider. Document Released: 03/09/2011 Document Revised: 01/30/2016 Document Reviewed: 05/28/2015 Elsevier Interactive Patient Education  [employer].

## 2017-10-11 NOTE — Progress Notes (Addendum)
Subjective:   Sara Doyle is a 72 y.o. female who presents for Medicare Annual (Subsequent) preventive examination.  Review of Systems:  No ROS.  Medicare Wellness Visit. Additional risk factors are reflected in the social history.  Cardiac Risk Factors include: advanced age (>41men, >26 women);family history of premature cardiovascular disease   Sleep patterns: Sleeps 6 hours, feels rested.  Home Safety/Smoke Alarms: Feels safe in home. Smoke alarms in place.  Living environment; residence and Firearm Safety: Lives with husband in 2 story home [place] Safety/Bike Helmet: Wears seat belt.   Female:   Pap-N/A      Mammo-11/30/2016,  BI-RADS CATEGORY  1: Negative. Plans to repeat every 2 years.  Dexa scan-11/29/2015, Osteopenia. Takes Calcium with Vitamin D. Per PCP note (10/12/16), recall 3 years.     CCS-colonoscopy 08/09/2009, normal. Recall 10 years     Objective:     Vitals: BP (!) 104/58 (BP Location: Left Arm, Patient Position: Sitting, Cuff Size: Normal)   Pulse (!) 48   Ht 5\' 2"  (1.575 m)   Wt 132 lb 12.8 oz (60.2 kg)   SpO2 96%   BMI 24.29 kg/m   Body mass index is 24.29 kg/m.  Advanced Directives 10/11/2017 04/13/2017 10/05/2016  Does Patient Have a Medical Advance Directive? No No No  Would patient like information on creating a medical advance directive? No - Patient declined No - Patient declined No - Patient declined    Tobacco Social History   Tobacco Use  Smoking Status Former Smoker  . Packs/day: 1.00  . Years: 25.00  . Pack years: 25.00  . Types: Cigarettes  . Last attempt to quit: 09/07/1993  . Years since quitting: 24.1  Smokeless Tobacco Never Used     Counseling given: Not Answered    Past Medical History:  Diagnosis Date  . Allergy    Past Surgical History:  Procedure Laterality Date  . FRACTURE SURGERY  1969   right leg  . FRACTURE SURGERY  2005   right leg  . SKIN LESION EXCISION Right 07/08/2017   Behind right knee, benign   . 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 TUBAL LIGATION     Family History  Problem Relation Age of Onset  . Hypertension Mother   . Arthritis Father        osteo  . Heart disease Brother        congenital  . Breast cancer Sister 36       breast  . Arthritis Sister   . Hypertension Sister   . Cancer Brother        prostate, colon  . Arrhythmia Brother        a-fib  . Heart disease Brother    Social History   Socioeconomic History  . Marital status: Married    Spouse name: None  . Number of children: 3  . Years of education: None  . Highest education level: None  Social Needs  . Financial resource strain: None  . Food insecurity - worry: None  . Food insecurity - inability: None  . Transportation needs - medical: None  . Transportation needs - non-medical: None  Occupational History    Comment: retired  Tobacco Use  . Smoking status: Former Smoker    Packs/day: 1.00    Years: 25.00    Pack years: 25.00    Types: Cigarettes    Last attempt to quit: 09/07/1993    Years since  quitting: 24.1  . Smokeless tobacco: Never Used  Substance and Sexual Activity  . Alcohol use: Yes    Alcohol/week: 2.4 oz    Types: 4 Glasses of wine per week    Comment: 4-5 glasses/week  . Drug use: No  . Sexual activity: Yes    Birth control/protection: Post-menopausal  Other Topics Concern  . None  Social History Narrative   [name] lives with her husband, She relocated from [place] Nov., 2015. She is retired, has 3 grown sons that live in [place].   Smoke alarm in the home, wears her seatbelt, uses sunscreen.   Ambulates independently. No dentures.   Has dentist with regular appts.    Feels safe in her relationships    Outpatient Encounter Medications as of 10/11/2017  Medication Sig  . Biotin 5000 MCG TABS Take 5,000 mg by mouth.  . Calcium Carb-Cholecalciferol (CALCIUM 600 + D PO) Take 2 tablets by mouth.   Marland Kitchen aspirin 81 MG tablet Take 81 mg by mouth  daily.  . meloxicam (MOBIC) 15 MG tablet Take 1 tablet (15 mg total) by mouth daily. (Patient not taking: Reported on 10/11/2017)  . Zoster Vaccine Adjuvanted [facility]) injection Inject 0.5 mLs into the muscle once for 1 dose.  . [DISCONTINUED] Calcium Carb-Ergocalciferol 250-125 MG-UNIT TABS Take by mouth.   No facility-administered encounter medications on file as of 10/11/2017.     Activities of Daily Living In your present state of health, do you have any difficulty performing the following activities: 10/11/2017  Hearing? N  Vision? N  Difficulty concentrating or making decisions? N  Walking or climbing stairs? N  Dressing or bathing? N  Doing errands, shopping? N  Preparing Food and eating ? N  Using the Toilet? N  In the past six months, have you accidently leaked urine? N  Do you have problems with loss of bowel control? N  Managing your Medications? N  Managing your Finances? N  Housekeeping or managing your Housekeeping? N  Some recent data might be hidden    Patient Care Team: Ma Hillock, DO as PCP - General (Family Medicine) Milus Banister, MD as Attending Physician (Gastroenterology) Rutherford Guys, MD as Consulting Physician (Ophthalmology) Sallyanne Kuster (Dentistry) Dickie La, MD as Consulting Physician (Family Medicine)    Assessment:   This is a routine wellness examination for [place].  Exercise Activities and Dietary recommendations Current Exercise Habits: Structured exercise class, Type of exercise: strength training/weights;treadmill(elliptical), Time (Minutes): > 60, Frequency (Times/Week): 4, Weekly Exercise (Minutes/Week): 0, Exercise limited by: None identified   Diet (meal preparation, eat out, water intake, caffeinated beverages, dairy products, fruits and vegetables): Drinks decaf coffee, wine and water.   Breakfast: oatmeal; fruit; yogurt Lunch: snack (nuts, popcorn) Dinner: salmon; poultry; vegetable/salad      Goals    . Weight (lb) <  124 lb (56.2 kg)     Would like to lose 10 pounds by decreasing about of salty snacks (nuts).     . Weight (lb) < 124 lb (56.2 kg)     Lose weight by decreasing snacks.        Fall Risk Fall Risk  10/11/2017 04/13/2017 10/05/2016 10/04/2015 10/04/2015  Falls in the past year? No Yes No No No  Number falls in past yr: - 1 - - -  Injury with Fall? - No - - -  Risk for fall due to : - - - Impaired vision -    Depression Screen PHQ  2/9 Scores 10/11/2017 10/05/2016 10/04/2015 10/04/2015  PHQ - 2 Score 0 0 0 0     Cognitive Function MMSE - Mini Mental State Exam 10/11/2017  Orientation to time 5  Orientation to Place 5  Registration 3  Attention/ Calculation 5  Recall 3  Language- name 2 objects 2  Language- repeat 1  Language- follow 3 step command 3  Language- read & follow direction 1  Write a sentence 1  Copy design 1  Total score 30        Immunization History  Administered Date(s) Administered  . Influenza-Unspecified 06/23/2015, 06/21/2016, 06/07/2017  . Pneumococcal Conjugate-13 02/12/2014  . Pneumococcal Polysaccharide-23 01/06/2011  . Tdap 01/06/2003     Screening Tests Health Maintenance  Topic Date Due  . MAMMOGRAM  11/30/2017  . DEXA SCAN  11/29/2018  . COLONOSCOPY  08/10/2019  . TETANUS/TDAP  10/03/2020  . INFLUENZA VACCINE  Completed  . Hepatitis C Screening  Completed  . PNA vac Low Risk Adult  Completed       Plan:     Shingles vaccine at pharmacy.   Continue doing brain stimulating activities (puzzles, reading, adult coloring books, staying active) to keep memory sharp.   Bring a copy of your living will and/or healthcare power of attorney to your next office visit.   I have personally reviewed and noted the following in the patient's chart:   . Medical and social history . Use of alcohol, tobacco or illicit drugs  . Current medications and supplements . Functional ability and status . Nutritional status . Physical activity . Advanced  directives . List of other physicians . Hospitalizations, surgeries, and ER visits in previous 12 months . Vitals . Screenings to include cognitive, depression, and falls . Referrals and appointments  In addition, I have reviewed and discussed with patient certain preventive protocols, quality metrics, and best practice recommendations. A written personalized care plan for preventive services as well as general preventive health recommendations were provided to patient.     Gerilyn Nestle, RN  10/11/2017  PCP Notes:  -Pt interested in physical therapy for right hip pain that is affecting ambulation. Advised to contact ortho for referral (was evaluated 04/2017 by Dr. Nori Riis).   -No F/U appt with PCP  Medical screening examination/treatment/procedure(s) were performed by non-physician practitioner and as supervising physician I was immediately available for consultation/collaboration.  I agree with above assessment and plan.  Electronically Signed by: Howard Pouch, DO [place] [facility] [place]

## 2017-10-12 ENCOUNTER — Encounter: Payer: Self-pay | Admitting: Family Medicine

## 2017-12-07 DIAGNOSIS — M25551 Pain in right hip: Secondary | ICD-10-CM | POA: Diagnosis not present

## 2018-02-01 DIAGNOSIS — M25551 Pain in right hip: Secondary | ICD-10-CM | POA: Diagnosis not present

## 2018-05-10 NOTE — Patient Instructions (Addendum)
Sara Doyle  05/10/2018   Your procedure is scheduled on: 05-20-18   Report to [facility] Main  Entrance     Report to admitting at 10:00 AM    Call this number if you have problems the morning of surgery [phone]   Remember: Do not eat food or drink liquids :After Midnight.     Take these medicines the morning of surgery with A SIP OF WATER: None                                 You may not have any metal on your body including hair pins and              piercings  Do not wear jewelry, make-up, lotions, powders or perfumes, deodorant             Do not wear nail polish.  Do not shave  48 hours prior to surgery.            Do not bring valuables to the hospital. [place].  Contacts, dentures or bridgework may not be worn into surgery.  Leave suitcase in the car. After surgery it may be brought to your room.     Special Instructions: N/A              Please read over the following fact sheets you were given: _____________________________________________________________________             [facility] - Preparing for Surgery Before surgery, you can play an important role.  Because skin is not sterile, your skin needs to be as free of germs as possible.  You can reduce the number of germs on your skin by washing with CHG (chlorahexidine gluconate) soap before surgery.  CHG is an antiseptic cleaner which kills germs and bonds with the skin to continue killing germs even after washing. Please DO NOT use if you have an allergy to CHG or antibacterial soaps.  If your skin becomes reddened/irritated stop using the CHG and inform your nurse when you arrive at Short Stay. Do not shave (including legs and underarms) for at least 48 hours prior to the first CHG shower.  You may shave your face/neck. Please follow these instructions carefully:  1.  Shower with CHG Soap the night before surgery and the  morning  of Surgery.  2.  If you choose to wash your hair, wash your hair first as usual with your  normal  shampoo.  3.  After you shampoo, rinse your hair and body thoroughly to remove the  shampoo.                           4.  Use CHG as you would any other liquid soap.  You can apply chg directly  to the skin and wash                       Gently with a scrungie or clean washcloth.  5.  Apply the CHG Soap to your body ONLY FROM THE NECK DOWN.   Do not use on face/ open  Wound or open sores. Avoid contact with eyes, ears mouth and genitals (private parts).                       Wash face,  Genitals (private parts) with your normal soap.             6.  Wash thoroughly, paying special attention to the area where your surgery  will be performed.  7.  Thoroughly rinse your body with warm water from the neck down.  8.  DO NOT shower/wash with your normal soap after using and rinsing off  the CHG Soap.                9.  Pat yourself dry with a clean towel.            10.  Wear clean pajamas.            11.  Place clean sheets on your bed the night of your first shower and do not  sleep with pets. Day of Surgery : Do not apply any lotions/deodorants the morning of surgery.  Please wear clean clothes to the hospital/surgery center.  FAILURE TO FOLLOW THESE INSTRUCTIONS MAY RESULT IN THE CANCELLATION OF YOUR SURGERY PATIENT SIGNATURE_________________________________  NURSE SIGNATURE__________________________________  ________________________________________________________________________

## 2018-05-12 ENCOUNTER — Other Ambulatory Visit: Payer: Self-pay

## 2018-05-12 ENCOUNTER — Encounter (HOSPITAL_COMMUNITY): Payer: Self-pay

## 2018-05-12 ENCOUNTER — Encounter (HOSPITAL_COMMUNITY)
Admission: RE | Admit: 2018-05-12 | Discharge: 2018-05-12 | Disposition: A | Discharge status: Home / Self Care | Payer: Medicare Other | Source: Ambulatory Visit | Attending: Orthopedic Surgery | Admitting: Orthopedic Surgery

## 2018-05-12 DIAGNOSIS — Z01812 Encounter for preprocedural laboratory examination: Secondary | ICD-10-CM | POA: Insufficient documentation

## 2018-05-12 LAB — CBC
HEMATOCRIT: 39.3 % (ref 36.0–46.0)
HEMOGLOBIN: 13.4 g/dL (ref 12.0–15.0)
MCH: 33.7 pg (ref 26.0–34.0)
MCHC: 34.1 g/dL (ref 30.0–36.0)
MCV: 98.7 fL (ref 78.0–100.0)
Platelets: 264 10*3/uL (ref 150–400)
RBC: 3.98 MIL/uL (ref 3.87–5.11)
RDW: 12.7 % (ref 11.5–15.5)
WBC: 4.8 10*3/uL (ref 4.0–10.5)

## 2018-05-12 LAB — BASIC METABOLIC PANEL
ANION GAP: 10 (ref 5–15)
BUN: 14 mg/dL (ref 8–23)
CHLORIDE: 95 mmol/L — AB (ref 98–111)
CO2: 29 mmol/L (ref 22–32)
Calcium: 10 mg/dL (ref 8.9–10.3)
Creatinine, Ser: 0.58 mg/dL (ref 0.44–1.00)
GFR calc Af Amer: >60 mL/min (ref >60)
GFR calc non Af Amer: >60 mL/min (ref >60)
GLUCOSE: 94 mg/dL (ref 70–99)
POTASSIUM: 4.5 mmol/L (ref 3.5–5.1)
Sodium: 134 mmol/L — ABNORMAL LOW (ref 135–145)

## 2018-05-12 LAB — SURGICAL PCR SCREEN
MRSA, PCR: NEGATIVE
Staphylococcus aureus: NEGATIVE

## 2018-05-12 NOTE — Care Plan (Signed)
Spoke with patient prior to surgery. She plans to discharge to home with family and the following arrangements:   DME: walker and 3n1 to be delivered to hospital room by [place]: [facility]: [place] 05/31/18 follow MD appt  MD follow up: 05/31/18 @ 1015   Please contact [place], [place] with questions or if this plan should need to change.   Thanks

## 2018-05-13 NOTE — Progress Notes (Signed)
05-13-18 Pt advised that surgery time has changed from 12:30pm to 10:15 AM. Pt to arrive to admitting at 7:45 AM, and to remain NPO after midnight. Pt verbalized understanding.

## 2018-05-19 ENCOUNTER — Other Ambulatory Visit: Payer: Self-pay | Admitting: Orthopedic Surgery

## 2018-05-19 NOTE — Progress Notes (Signed)
Called Dr Jackalyn Lombard office to request orders for 05/20/18 surgery.

## 2018-05-19 NOTE — H&P (Signed)
TOTAL HIP ADMISSION H&P  Patient is admitted for right total hip arthroplasty.  Subjective:  Chief Complaint: right hip pain  HPI: Sara Doyle, 72 y.o. female, has a history of pain and functional disability in the right hip(s) due to arthritis and patient has failed non-surgical conservative treatments for greater than 12 weeks to include NSAID's and/or analgesics, weight reduction as appropriate and activity modification.  Onset of symptoms was gradual starting 3 years ago with gradually worsening course since that time.The patient noted no past surgery on the right hip(s).  Patient currently rates pain in the right hip at 8 out of 10 with activity. Patient has night pain, worsening of pain with activity and weight bearing, trendelenberg gait, pain that interfers with activities of daily living, pain with passive range of motion, crepitus and joint swelling. Patient has evidence of subchondral sclerosis, periarticular osteophytes and joint space narrowing by imaging studies. This condition presents safety issues increasing the risk of falls. This patient has had Failure of all reasonable conservative care.  There is no current active infection.  Patient Active Problem List   Diagnosis Date Noted  . Osteoarthritis of right hip 05/05/2017  . Muscle strain of thigh, right, initial encounter 01/27/2017  . Macrocytosis 10/13/2016  . Elevated cholesterol 10/12/2016  . Osteopenia 12/02/2015  . Vaginal dryness, menopausal 10/04/2015  . Preventative health care 10/03/2014  . BMI 24.0-24.9, adult 10/03/2014  . Encounter for long-term (current) use of medications 10/03/2014  . Colon cancer screening 10/03/2014   Past Medical History:  Diagnosis Date  . Allergy     Past Surgical History:  Procedure Laterality Date  . FRACTURE SURGERY  1969   right leg  . FRACTURE SURGERY  2005   right leg  . SKIN LESION EXCISION Right 07/08/2017   Behind right knee, benign  . 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 TUBAL LIGATION      No current facility-administered medications for this encounter.    Current Outpatient Medications  Medication Sig Dispense Refill Last Dose  . Biotin 5000 MCG TABS Take 5,000 mcg by mouth daily.    Taking  . ibuprofen (ADVIL,MOTRIN) 200 MG tablet Take 200 mg by mouth daily.     Marland Kitchen MELATONIN PO Take 1 tablet by mouth at bedtime as needed (for sleep.).      Marland Kitchen naproxen sodium (ALEVE) 220 MG tablet Take 220 mg by mouth daily as needed (for pain.).     Marland Kitchen meloxicam (MOBIC) 15 MG tablet Take 1 tablet (15 mg total) by mouth daily. (Patient not taking: Reported on 10/11/2017) 30 tablet 0 Not Taking   Allergies  Allergen Reactions  . 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 Demerol [Meperidine] Nausea And Vomiting    Social History   Tobacco Use  . Smoking status: Former Smoker    Packs/day: 1.00    Years: 25.00    Pack years: 25.00    Types: Cigarettes    Last attempt to quit: 09/07/1993    Years since quitting: 24.7  . Smokeless tobacco: Never Used  Substance Use Topics  . Alcohol use: Yes    Alcohol/week: 4.0 standard drinks    Types: 4 Glasses of wine per week    Comment: 4-5 glasses/week    Family History  Problem Relation Age of Onset  . Hypertension Mother   . Arthritis Father        osteo  .  Heart disease Brother        congenital  . Breast cancer Sister 1       breast  . Arthritis Sister   . Hypertension Sister   . Cancer Brother        prostate, colon  . Arrhythmia Brother        a-fib  . Heart disease Brother      ROS ROS: I have reviewed the patient's review of systems thoroughly and there are no positive responses as relates to the HPI. Objective:  Physical Exam  Vital signs in last 24 hours:   Well-developed well-nourished patient in no acute distress. Alert and oriented x3 HEENT:within normal limits Cardiac: Regular rate and  rhythm Pulmonary: Lungs clear to auscultation Abdomen: Soft and nontender.  Normal active bowel sounds  Musculoskeletal: (Right hip: Painful range of motion.  Limited range of motion.  Lack of internal rotation.  Neurovascular intact distally. Labs:  Recent Results (from the past 2160 hour(s))  Basic metabolic panel     Status: Abnormal   Collection Time: 05/12/18  9:22 AM  Result Value Ref Range   Sodium 134 (L) 135 - 145 mmol/L   Potassium 4.5 3.5 - 5.1 mmol/L   Chloride 95 (L) 98 - 111 mmol/L   CO2 29 22 - 32 mmol/L   Glucose, Bld 94 70 - 99 mg/dL   BUN 14 8 - 23 mg/dL   Creatinine, Ser 0.58 0.44 - 1.00 mg/dL   Calcium 10.0 8.9 - 10.3 mg/dL   GFR calc non Af Amer >60 >60 mL/min   GFR calc Af Amer >60 >60 mL/min    Comment: (NOTE) The eGFR has been calculated using the CKD EPI equation. This calculation has not been validated in all clinical situations. eGFR's persistently <60 mL/min signify possible Chronic Kidney Disease.    Anion gap 10 5 - 15    Comment: Performed at [facility], [place] [address] [place], [place] [address]  CBC     Status: None   Collection Time: 05/12/18  9:22 AM  Result Value Ref Range   WBC 4.8 4.0 - 10.5 K/uL   RBC 3.98 3.87 - 5.11 MIL/uL   Hemoglobin 13.4 12.0 - 15.0 g/dL   HCT 39.3 36.0 - 46.0 %   MCV 98.7 78.0 - 100.0 fL   MCH 33.7 26.0 - 34.0 pg   MCHC 34.1 30.0 - 36.0 g/dL   RDW 12.7 11.5 - 15.5 %   Platelets 264 150 - 400 K/uL    Comment: Performed at [facility], [place] [address] [place], [place] [address]  Surgical pcr screen     Status: None   Collection Time: 05/12/18  9:22 AM  Result Value Ref Range   MRSA, PCR NEGATIVE NEGATIVE   Staphylococcus aureus NEGATIVE NEGATIVE    Comment: (NOTE) The Xpert SA Assay (FDA approved for NASAL specimens in patients 71 years of age and older), is one component of a comprehensive surveillance program. It is not intended to diagnose infection nor to guide  or monitor treatment. Performed at [facility], [place] [address] [place], [place] [address]    Estimated body mass index is 23.83 kg/m as calculated from the following:   Height as of 05/12/18: 5' 1"  (1.549 m).   Weight as of 05/12/18: 57.2 kg.   Imaging Review Plain radiographs demonstrate severe degenerative joint disease of the right hip(s). The bone quality appears to be fair for age and reported  activity level.    Preoperative templating of the joint replacement has been completed, documented, and submitted to the Operating Room personnel in order to optimize intra-operative equipment management.     Assessment/Plan:  End stage arthritis, right hip(s)  The patient history, physical examination, clinical judgement of the provider and imaging studies are consistent with end stage degenerative joint disease of the right hip(s) and total hip arthroplasty is deemed medically necessary. The treatment options including medical management, injection therapy, arthroscopy and arthroplasty were discussed at length. The risks and benefits of total hip arthroplasty were presented and reviewed. The risks due to aseptic loosening, infection, stiffness, dislocation/subluxation,  thromboembolic complications and other imponderables were discussed.  The patient acknowledged the explanation, agreed to proceed with the plan and consent was signed. Patient is being admitted for inpatient treatment for surgery, pain control, PT, OT, prophylactic antibiotics, VTE prophylaxis, progressive ambulation and ADL's and discharge planning.The patient is planning to be discharged home with home health services

## 2018-05-20 ENCOUNTER — Inpatient Hospital Stay (HOSPITAL_COMMUNITY)
Admission: RE | Admit: 2018-05-20 | Discharge: 2018-05-21 | DRG: 470 | Disposition: A | Discharge status: Home Health | Payer: Medicare Other | Source: Ambulatory Visit | Attending: Orthopedic Surgery | Admitting: Orthopedic Surgery

## 2018-05-20 ENCOUNTER — Inpatient Hospital Stay (HOSPITAL_COMMUNITY): Payer: Medicare Other | Admitting: Anesthesiology

## 2018-05-20 ENCOUNTER — Inpatient Hospital Stay (HOSPITAL_COMMUNITY): Payer: Medicare Other

## 2018-05-20 ENCOUNTER — Other Ambulatory Visit: Payer: Self-pay

## 2018-05-20 ENCOUNTER — Encounter (HOSPITAL_COMMUNITY): Payer: Self-pay | Admitting: *Deleted

## 2018-05-20 ENCOUNTER — Encounter (HOSPITAL_COMMUNITY)
Admission: RE | Disposition: A | Discharge status: Home Health | Payer: Self-pay | Source: Ambulatory Visit | Attending: Orthopedic Surgery

## 2018-05-20 DIAGNOSIS — M1611 Unilateral primary osteoarthritis, right hip: Principal | ICD-10-CM | POA: Diagnosis present

## 2018-05-20 DIAGNOSIS — Z01818 Encounter for other preprocedural examination: Secondary | ICD-10-CM

## 2018-05-20 DIAGNOSIS — Z96641 Presence of right artificial hip joint: Secondary | ICD-10-CM | POA: Diagnosis not present

## 2018-05-20 DIAGNOSIS — Z87891 Personal history of nicotine dependence: Secondary | ICD-10-CM

## 2018-05-20 DIAGNOSIS — Z8261 Family history of arthritis: Secondary | ICD-10-CM | POA: Diagnosis not present

## 2018-05-20 DIAGNOSIS — Z803 Family history of malignant neoplasm of breast: Secondary | ICD-10-CM

## 2018-05-20 DIAGNOSIS — Z8249 Family history of ischemic heart disease and other diseases of the circulatory system: Secondary | ICD-10-CM

## 2018-05-20 DIAGNOSIS — Z419 Encounter for procedure for purposes other than remedying health state, unspecified: Secondary | ICD-10-CM

## 2018-05-20 DIAGNOSIS — E7800 Pure hypercholesterolemia, unspecified: Secondary | ICD-10-CM | POA: Diagnosis not present

## 2018-05-20 DIAGNOSIS — Z471 Aftercare following joint replacement surgery: Secondary | ICD-10-CM | POA: Diagnosis not present

## 2018-05-20 DIAGNOSIS — J410 Simple chronic bronchitis: Secondary | ICD-10-CM | POA: Diagnosis not present

## 2018-05-20 HISTORY — PX: TOTAL HIP ARTHROPLASTY: SHX124

## 2018-05-20 LAB — COMPREHENSIVE METABOLIC PANEL
ALT: 17 U/L (ref 0–44)
ANION GAP: 12 (ref 5–15)
AST: 23 U/L (ref 15–41)
Albumin: 4.5 g/dL (ref 3.5–5.0)
Alkaline Phosphatase: 67 U/L (ref 38–126)
BUN: 15 mg/dL (ref 8–23)
CO2: 26 mmol/L (ref 22–32)
CREATININE: 0.6 mg/dL (ref 0.44–1.00)
Calcium: 9.7 mg/dL (ref 8.9–10.3)
Chloride: 99 mmol/L (ref 98–111)
GFR calc non Af Amer: >60 mL/min (ref >60)
Glucose, Bld: 89 mg/dL (ref 70–99)
Potassium: 4.1 mmol/L (ref 3.5–5.1)
SODIUM: 137 mmol/L (ref 135–145)
Total Bilirubin: 0.9 mg/dL (ref 0.3–1.2)
Total Protein: 7.3 g/dL (ref 6.5–8.1)

## 2018-05-20 LAB — CBC WITH DIFFERENTIAL/PLATELET
BASOS PCT: 0 %
Basophils Absolute: 0 10*3/uL (ref 0.0–0.1)
EOS PCT: 3 %
Eosinophils Absolute: 0.1 10*3/uL (ref 0.0–0.7)
HCT: 39.9 % (ref 36.0–46.0)
Hemoglobin: 13.7 g/dL (ref 12.0–15.0)
LYMPHS ABS: 1.8 10*3/uL (ref 0.7–4.0)
Lymphocytes Relative: 46 %
MCH: 33.8 pg (ref 26.0–34.0)
MCHC: 34.3 g/dL (ref 30.0–36.0)
MCV: 98.5 fL (ref 78.0–100.0)
Monocytes Absolute: 0.4 10*3/uL (ref 0.1–1.0)
Monocytes Relative: 10 %
Neutro Abs: 1.5 10*3/uL — ABNORMAL LOW (ref 1.7–7.7)
Neutrophils Relative %: 41 %
Platelets: 244 10*3/uL (ref 150–400)
RBC: 4.05 MIL/uL (ref 3.87–5.11)
RDW: 12.8 % (ref 11.5–15.5)
WBC: 3.8 10*3/uL — AB (ref 4.0–10.5)

## 2018-05-20 LAB — URINALYSIS, ROUTINE W REFLEX MICROSCOPIC
Bilirubin Urine: NEGATIVE
Glucose, UA: NEGATIVE mg/dL
Hgb urine dipstick: NEGATIVE
Ketones, ur: NEGATIVE mg/dL
Leukocytes, UA: NEGATIVE
Nitrite: NEGATIVE
Protein, ur: NEGATIVE mg/dL
Specific Gravity, Urine: 1.012 (ref 1.005–1.030)
pH: 7 (ref 5.0–8.0)

## 2018-05-20 LAB — TYPE AND SCREEN
ABO/RH(D): O POS
Antibody Screen: NEGATIVE

## 2018-05-20 LAB — PROTIME-INR
INR: 0.91
Prothrombin Time: 12.1 seconds (ref 11.4–15.2)

## 2018-05-20 LAB — APTT: APTT: 29 s (ref 24–36)

## 2018-05-20 LAB — ABO/RH: ABO/RH(D): O POS

## 2018-05-20 SURGERY — ARTHROPLASTY, HIP, TOTAL, ANTERIOR APPROACH
Anesthesia: Spinal | Site: Hip | Laterality: Right

## 2018-05-20 MED ORDER — SODIUM CHLORIDE 0.9 % IV SOLN
INTRAVENOUS | Status: DC | PRN
  Administered 2018-05-20: 20 ug/min via INTRAVENOUS

## 2018-05-20 MED ORDER — DOCUSATE SODIUM 100 MG PO CAPS: 100.0000 mg | ORAL_CAPSULE | Freq: Two times a day (BID) | ORAL | 0 refills | Status: DC

## 2018-05-20 MED ORDER — SODIUM CHLORIDE 0.9 % IJ SOLN
INTRAMUSCULAR | Status: DC | PRN
  Administered 2018-05-20: 20 mL

## 2018-05-20 MED ORDER — GLYCOPYRROLATE PF 0.2 MG/ML IJ SOSY
PREFILLED_SYRINGE | INTRAMUSCULAR | Status: DC | PRN
  Administered 2018-05-20: .2 mg via INTRAVENOUS

## 2018-05-20 MED ORDER — CELECOXIB 200 MG PO CAPS
200.0000 mg | ORAL_CAPSULE | Freq: Two times a day (BID) | ORAL | Status: DC
  Administered 2018-05-20 – 2018-05-21 (×3): 200 mg via ORAL
  Filled 2018-05-20 (×3): qty 1

## 2018-05-20 MED ORDER — BISACODYL 5 MG PO TBEC: 5.0000 mg | DELAYED_RELEASE_TABLET | Freq: Every day | ORAL | Status: DC | PRN

## 2018-05-20 MED ORDER — ONDANSETRON HCL 4 MG/2ML IJ SOLN
INTRAMUSCULAR | Status: DC | PRN
  Administered 2018-05-20: 4 mg via INTRAVENOUS

## 2018-05-20 MED ORDER — MORPHINE SULFATE (PF) 2 MG/ML IV SOLN: 0.5000 mg | INTRAVENOUS | Status: DC | PRN

## 2018-05-20 MED ORDER — DEXAMETHASONE SODIUM PHOSPHATE 10 MG/ML IJ SOLN
INTRAMUSCULAR | Status: DC | PRN
  Administered 2018-05-20: 10 mg via INTRAVENOUS

## 2018-05-20 MED ORDER — ONDANSETRON HCL 4 MG PO TABS: 4.0000 mg | ORAL_TABLET | Freq: Four times a day (QID) | ORAL | Status: DC | PRN

## 2018-05-20 MED ORDER — LACTATED RINGERS IV SOLN
INTRAVENOUS | Status: DC
  Administered 2018-05-20 (×2): via INTRAVENOUS

## 2018-05-20 MED ORDER — ONDANSETRON HCL 4 MG/2ML IJ SOLN
INTRAMUSCULAR | Status: AC
  Filled 2018-05-20: qty 2

## 2018-05-20 MED ORDER — MAGNESIUM CITRATE PO SOLN: 1.0000 | Freq: Once | ORAL | Status: DC | PRN

## 2018-05-20 MED ORDER — BUPIVACAINE IN DEXTROSE 0.75-8.25 % IT SOLN
INTRATHECAL | Status: DC | PRN
  Administered 2018-05-20: 1.6 mL via INTRATHECAL

## 2018-05-20 MED ORDER — ALUM & MAG HYDROXIDE-SIMETH 200-200-20 MG/5ML PO SUSP: 30.0000 mL | ORAL | Status: DC | PRN

## 2018-05-20 MED ORDER — HYDROCODONE-ACETAMINOPHEN 5-325 MG PO TABS
1.0000 | ORAL_TABLET | ORAL | Status: DC | PRN
  Administered 2018-05-20 – 2018-05-21 (×2): 1 via ORAL
  Filled 2018-05-20 (×2): qty 1

## 2018-05-20 MED ORDER — SODIUM CHLORIDE 0.9 % IV SOLN
INTRAVENOUS | Status: DC
  Administered 2018-05-20 – 2018-05-21 (×3): via INTRAVENOUS

## 2018-05-20 MED ORDER — STERILE WATER FOR IRRIGATION IR SOLN
Status: DC | PRN
  Administered 2018-05-20: 2000 mL

## 2018-05-20 MED ORDER — SODIUM CHLORIDE 0.9 % IJ SOLN
INTRAMUSCULAR | Status: AC
  Filled 2018-05-20: qty 50

## 2018-05-20 MED ORDER — PHENYLEPHRINE 40 MCG/ML (10ML) SYRINGE FOR IV PUSH (FOR BLOOD PRESSURE SUPPORT)
PREFILLED_SYRINGE | INTRAVENOUS | Status: DC | PRN
  Administered 2018-05-20: 80 ug via INTRAVENOUS
  Administered 2018-05-20: 120 ug via INTRAVENOUS
  Administered 2018-05-20: 80 ug via INTRAVENOUS
  Administered 2018-05-20: 120 ug via INTRAVENOUS

## 2018-05-20 MED ORDER — PROPOFOL 10 MG/ML IV BOLUS
INTRAVENOUS | Status: AC
  Filled 2018-05-20: qty 20

## 2018-05-20 MED ORDER — METHOCARBAMOL 500 MG IVPB - SIMPLE MED
500.0000 mg | Freq: Four times a day (QID) | INTRAVENOUS | Status: DC | PRN
  Filled 2018-05-20: qty 50

## 2018-05-20 MED ORDER — CEFAZOLIN SODIUM-DEXTROSE 2-4 GM/100ML-% IV SOLN
2.0000 g | Freq: Four times a day (QID) | INTRAVENOUS | Status: AC
  Administered 2018-05-20 (×2): 2 g via INTRAVENOUS
  Filled 2018-05-20 (×2): qty 100

## 2018-05-20 MED ORDER — OXYCODONE HCL 5 MG PO TABS: 5.0000 mg | ORAL_TABLET | Freq: Once | ORAL | Status: DC | PRN

## 2018-05-20 MED ORDER — BUPIVACAINE-EPINEPHRINE (PF) 0.5% -1:200000 IJ SOLN
INTRAMUSCULAR | Status: AC
  Filled 2018-05-20: qty 30

## 2018-05-20 MED ORDER — POLYETHYLENE GLYCOL 3350 17 G PO PACK: 17.0000 g | PACK | Freq: Every day | ORAL | Status: DC | PRN

## 2018-05-20 MED ORDER — DIPHENHYDRAMINE HCL 12.5 MG/5ML PO ELIX: 12.5000 mg | ORAL_SOLUTION | ORAL | Status: DC | PRN

## 2018-05-20 MED ORDER — ONDANSETRON HCL 4 MG/2ML IJ SOLN
4.0000 mg | Freq: Four times a day (QID) | INTRAMUSCULAR | Status: DC | PRN
  Administered 2018-05-20: 4 mg via INTRAVENOUS
  Filled 2018-05-20: qty 2

## 2018-05-20 MED ORDER — PROPOFOL 500 MG/50ML IV EMUL
INTRAVENOUS | Status: DC | PRN
  Administered 2018-05-20: 75 ug/kg/min via INTRAVENOUS

## 2018-05-20 MED ORDER — FENTANYL CITRATE (PF) 100 MCG/2ML IJ SOLN
INTRAMUSCULAR | Status: AC
  Filled 2018-05-20: qty 2

## 2018-05-20 MED ORDER — FENTANYL CITRATE (PF) 100 MCG/2ML IJ SOLN: 25.0000 ug | INTRAMUSCULAR | Status: DC | PRN

## 2018-05-20 MED ORDER — GABAPENTIN 300 MG PO CAPS
300.0000 mg | ORAL_CAPSULE | Freq: Two times a day (BID) | ORAL | Status: DC
  Administered 2018-05-20 – 2018-05-21 (×3): 300 mg via ORAL
  Filled 2018-05-20 (×3): qty 1

## 2018-05-20 MED ORDER — DEXAMETHASONE SODIUM PHOSPHATE 10 MG/ML IJ SOLN
INTRAMUSCULAR | Status: AC
  Filled 2018-05-20: qty 1

## 2018-05-20 MED ORDER — HYDROCODONE-ACETAMINOPHEN 7.5-325 MG PO TABS: 1.0000 | ORAL_TABLET | ORAL | Status: DC | PRN

## 2018-05-20 MED ORDER — SODIUM CHLORIDE 0.9 % IR SOLN
Status: DC | PRN
  Administered 2018-05-20: 1000 mL

## 2018-05-20 MED ORDER — METHOCARBAMOL 500 MG PO TABS
500.0000 mg | ORAL_TABLET | Freq: Four times a day (QID) | ORAL | Status: DC | PRN
  Administered 2018-05-21: 500 mg via ORAL
  Filled 2018-05-20 (×2): qty 1

## 2018-05-20 MED ORDER — BUPIVACAINE LIPOSOME 1.3 % IJ SUSP
10.0000 mL | Freq: Once | INTRAMUSCULAR | Status: DC
  Filled 2018-05-20: qty 20

## 2018-05-20 MED ORDER — FENTANYL CITRATE (PF) 100 MCG/2ML IJ SOLN
INTRAMUSCULAR | Status: DC | PRN
  Administered 2018-05-20: 25 ug via INTRAVENOUS
  Administered 2018-05-20: 50 ug via INTRAVENOUS
  Administered 2018-05-20: 25 ug via INTRAVENOUS

## 2018-05-20 MED ORDER — ASPIRIN EC 325 MG PO TBEC: 325.0000 mg | DELAYED_RELEASE_TABLET | Freq: Two times a day (BID) | ORAL | 0 refills | Status: DC

## 2018-05-20 MED ORDER — BUPIVACAINE-EPINEPHRINE (PF) 0.5% -1:200000 IJ SOLN
INTRAMUSCULAR | Status: DC | PRN
  Administered 2018-05-20: 20 mL

## 2018-05-20 MED ORDER — TIZANIDINE HCL 2 MG PO TABS: 2.0000 mg | ORAL_TABLET | Freq: Three times a day (TID) | ORAL | 0 refills | Status: DC | PRN

## 2018-05-20 MED ORDER — EPHEDRINE SULFATE-NACL 50-0.9 MG/10ML-% IV SOSY
PREFILLED_SYRINGE | INTRAVENOUS | Status: DC | PRN
  Administered 2018-05-20: 5 mg via INTRAVENOUS

## 2018-05-20 MED ORDER — TRANEXAMIC ACID 1000 MG/10ML IV SOLN
1000.0000 mg | INTRAVENOUS | Status: AC
  Administered 2018-05-20: 1000 mg via INTRAVENOUS
  Filled 2018-05-20: qty 10
  Filled 2018-05-20: qty 1000

## 2018-05-20 MED ORDER — ONDANSETRON HCL 4 MG/2ML IJ SOLN: 4.0000 mg | Freq: Once | INTRAMUSCULAR | Status: DC | PRN

## 2018-05-20 MED ORDER — ACETAMINOPHEN 325 MG PO TABS: 325.0000 mg | ORAL_TABLET | Freq: Four times a day (QID) | ORAL | Status: DC | PRN

## 2018-05-20 MED ORDER — PROPOFOL 10 MG/ML IV BOLUS
INTRAVENOUS | Status: AC
  Filled 2018-05-20: qty 40

## 2018-05-20 MED ORDER — CEFAZOLIN SODIUM-DEXTROSE 2-4 GM/100ML-% IV SOLN
2.0000 g | INTRAVENOUS | Status: AC
  Administered 2018-05-20: 2 g via INTRAVENOUS
  Filled 2018-05-20: qty 100

## 2018-05-20 MED ORDER — MIDAZOLAM HCL 5 MG/5ML IJ SOLN
INTRAMUSCULAR | Status: DC | PRN
  Administered 2018-05-20 (×2): 1 mg via INTRAVENOUS

## 2018-05-20 MED ORDER — MIDAZOLAM HCL 2 MG/2ML IJ SOLN
INTRAMUSCULAR | Status: AC
  Filled 2018-05-20: qty 2

## 2018-05-20 MED ORDER — DOCUSATE SODIUM 100 MG PO CAPS
100.0000 mg | ORAL_CAPSULE | Freq: Two times a day (BID) | ORAL | Status: DC
  Administered 2018-05-20 – 2018-05-21 (×2): 100 mg via ORAL
  Filled 2018-05-20 (×2): qty 1

## 2018-05-20 MED ORDER — DEXAMETHASONE SODIUM PHOSPHATE 10 MG/ML IJ SOLN
10.0000 mg | Freq: Two times a day (BID) | INTRAMUSCULAR | Status: AC
  Administered 2018-05-20 – 2018-05-21 (×3): 10 mg via INTRAVENOUS
  Filled 2018-05-20 (×3): qty 1

## 2018-05-20 MED ORDER — HYDROCODONE-ACETAMINOPHEN 10-325 MG PO TABS: 1.0000 | ORAL_TABLET | Freq: Four times a day (QID) | ORAL | 0 refills | Status: DC | PRN

## 2018-05-20 MED ORDER — ASPIRIN EC 325 MG PO TBEC
325.0000 mg | DELAYED_RELEASE_TABLET | Freq: Two times a day (BID) | ORAL | Status: DC
  Administered 2018-05-21: 325 mg via ORAL
  Filled 2018-05-20: qty 1

## 2018-05-20 MED ORDER — OXYCODONE HCL 5 MG/5ML PO SOLN
5.0000 mg | Freq: Once | ORAL | Status: DC | PRN
  Filled 2018-05-20: qty 5

## 2018-05-20 MED ORDER — BUPIVACAINE LIPOSOME 1.3 % IJ SUSP
INTRAMUSCULAR | Status: DC | PRN
  Administered 2018-05-20: 10 mL

## 2018-05-20 MED ORDER — TRANEXAMIC ACID 1000 MG/10ML IV SOLN
1000.0000 mg | Freq: Once | INTRAVENOUS | Status: AC
  Administered 2018-05-20: 1000 mg via INTRAVENOUS
  Filled 2018-05-20: qty 1000

## 2018-05-20 MED ORDER — PHENYLEPHRINE HCL 10 MG/ML IJ SOLN
INTRAMUSCULAR | Status: AC
  Filled 2018-05-20: qty 1

## 2018-05-20 MED ORDER — POVIDONE-IODINE 7.5 % EX SOLN: Freq: Once | CUTANEOUS | Status: DC

## 2018-05-20 SURGICAL SUPPLY — 47 items
BAG ZIPLOCK 12X15 (MISCELLANEOUS) IMPLANT
BENZOIN TINCTURE PRP APPL 2/3 (GAUZE/BANDAGES/DRESSINGS) ×3 IMPLANT
BLADE SAW SGTL 18X1.27X75 (BLADE) ×2 IMPLANT
BLADE SAW SGTL 18X1.27X75MM (BLADE) ×1
CLOSURE WOUND 1/2 X4 (GAUZE/BANDAGES/DRESSINGS) ×1
COVER PERINEAL POST (MISCELLANEOUS) ×3 IMPLANT
COVER SURGICAL LIGHT HANDLE (MISCELLANEOUS) ×6 IMPLANT
CUP ACETABULAR GRIPTON 100 52 (Orthopedic Implant) ×1 IMPLANT
DECANTER SPIKE VIAL GLASS SM (MISCELLANEOUS) ×3 IMPLANT
DRAPE STERI IOBAN 125X83 (DRAPES) ×3 IMPLANT
DRAPE U-SHAPE 47X51 STRL (DRAPES) ×6 IMPLANT
DRSG AQUACEL AG ADV 3.5X10 (GAUZE/BANDAGES/DRESSINGS) ×3 IMPLANT
DURAPREP 26ML APPLICATOR (WOUND CARE) ×3 IMPLANT
ELECT REM PT RETURN 15FT ADLT (MISCELLANEOUS) ×3 IMPLANT
ELIMINATOR HOLE APEX DEPUY (Hips) ×3 IMPLANT
GAUZE XEROFORM 1X8 LF (GAUZE/BANDAGES/DRESSINGS) IMPLANT
GLOVE BIOGEL M 7.0 STRL (GLOVE) ×6 IMPLANT
GLOVE BIOGEL PI IND STRL 7.5 (GLOVE) ×2 IMPLANT
GLOVE BIOGEL PI IND STRL 8 (GLOVE) ×2 IMPLANT
GLOVE BIOGEL PI IND STRL 9 (GLOVE) ×1 IMPLANT
GLOVE BIOGEL PI INDICATOR 7.5 (GLOVE) ×4
GLOVE BIOGEL PI INDICATOR 8 (GLOVE) ×4
GLOVE BIOGEL PI INDICATOR 9 (GLOVE) ×2
GLOVE ECLIPSE 7.5 STRL STRAW (GLOVE) ×6 IMPLANT
GLOVE SURG ORTHO 8.5 STRL (GLOVE) ×3 IMPLANT
GOWN SPEC L3 XXLG W/TWL (GOWN DISPOSABLE) ×3 IMPLANT
GOWN STRL REUS W/TWL LRG LVL3 (GOWN DISPOSABLE) ×3 IMPLANT
GOWN STRL REUS W/TWL XL LVL3 (GOWN DISPOSABLE) ×6 IMPLANT
GRIPTON 100 52 (Orthopedic Implant) ×3 IMPLANT
HEAD CERAMIC DELTA 36 PLUS 1.5 (Hips) ×3 IMPLANT
HOLDER FOLEY CATH W/STRAP (MISCELLANEOUS) ×3 IMPLANT
HOOD PEEL AWAY FLYTE STAYCOOL (MISCELLANEOUS) ×9 IMPLANT
LINER NEUTRAL 52X36MM PLUS 4 (Liner) ×3 IMPLANT
NEEDLE HYPO 22GX1.5 SAFETY (NEEDLE) ×6 IMPLANT
PACK ANTERIOR HIP CUSTOM (KITS) ×3 IMPLANT
STAPLER VISISTAT 35W (STAPLE) IMPLANT
STEM CORAIL KA11 (Stem) ×3 IMPLANT
STRIP CLOSURE SKIN 1/2X4 (GAUZE/BANDAGES/DRESSINGS) ×2 IMPLANT
SUT ETHIBOND NAB CT1 #1 30IN (SUTURE) ×6 IMPLANT
SUT MNCRL AB 3-0 PS2 18 (SUTURE) ×3 IMPLANT
SUT VIC AB 0 CT1 36 (SUTURE) ×6 IMPLANT
SUT VIC AB 1 CT1 36 (SUTURE) ×3 IMPLANT
SUT VIC AB 2-0 CT1 27 (SUTURE) ×2
SUT VIC AB 2-0 CT1 TAPERPNT 27 (SUTURE) ×1 IMPLANT
TRAY FOLEY CATH 14FRSI W/METER (CATHETERS) ×3 IMPLANT
TRAY FOLEY MTR SLVR 16FR STAT (SET/KITS/TRAYS/PACK) IMPLANT
YANKAUER SUCT BULB TIP 10FT TU (MISCELLANEOUS) ×3 IMPLANT

## 2018-05-20 NOTE — Brief Op Note (Signed)
05/20/2018  3:51 PM  PATIENT:  Sara Doyle  72 y.o. female  PRE-OPERATIVE DIAGNOSIS:  RIGHT HIP DEGENERATIVE JOINT DISEASE  POST-OPERATIVE DIAGNOSIS:  RIGHT HIP DEGENERATIVE JOINT DISEASE  PROCEDURE:  Procedure(s): RIGHT TOTAL HIP ARTHROPLASTY ANTERIOR APPROACH (Right)  SURGEON:  Surgeon(s) and Role:    [name], MD - Primary  PHYSICIAN ASSISTANT:   ASSISTANTS: jim bethune   ANESTHESIA:   general  EBL:  450 mL   BLOOD ADMINISTERED:none  DRAINS: none   LOCAL MEDICATIONS USED:  MARCAINE     SPECIMEN:  No Specimen  DISPOSITION OF SPECIMEN:  N/A  COUNTS:  YES  TOURNIQUET:  * No tourniquets in log *  DICTATION: .Other Dictation: Dictation Number [id number]  PLAN OF CARE: Admit to inpatient   PATIENT DISPOSITION:  PACU - hemodynamically stable.   Delay start of Pharmacological VTE agent (>24hrs) due to surgical blood loss or risk of bleeding: no

## 2018-05-20 NOTE — Addendum Note (Signed)
Addendum  created 05/20/18 1433 by Lidia Collum, MD   Attestation recorded in [place], [place] filed

## 2018-05-20 NOTE — Anesthesia Preprocedure Evaluation (Signed)
Anesthesia Evaluation  Patient identified by MRN, date of birth, ID band Patient awake    Reviewed: Allergy & Precautions, NPO status , Patient's Chart, lab work & pertinent test results  History of Anesthesia Complications Negative for: history of anesthetic complications  Airway Mallampati: II  TM Distance: >3 FB Neck ROM: Full    Dental no notable dental hx.    Pulmonary neg pulmonary ROS, former smoker,    Pulmonary exam normal        Cardiovascular negative cardio ROS Normal cardiovascular exam     Neuro/Psych negative neurological ROS  negative psych ROS   GI/Hepatic negative GI ROS, Neg liver ROS,   Endo/Other  negative endocrine ROS  Renal/GU negative Renal ROS  negative genitourinary   Musculoskeletal  (+) Arthritis ,   Abdominal   Peds  Hematology negative hematology ROS (+)   Anesthesia Other Findings   Reproductive/Obstetrics                             Anesthesia Physical Anesthesia Plan  ASA: I  Anesthesia Plan: Spinal   Post-op Pain Management:    Induction:   PONV Risk Score and Plan: 2 and Propofol infusion, Ondansetron and Treatment may vary due to age or medical condition  Airway Management Planned: Natural Airway and Nasal Cannula  Additional Equipment:   Intra-op Plan:   Post-operative Plan:   Informed Consent: I have reviewed the patients History and Physical, chart, labs and discussed the procedure including the risks, benefits and alternatives for the proposed anesthesia with the patient or authorized representative who has indicated his/her understanding and acceptance.     Plan Discussed with:   Anesthesia Plan Comments:         Anesthesia Quick Evaluation

## 2018-05-20 NOTE — Anesthesia Procedure Notes (Signed)
Spinal  Patient location during procedure: OR Start time: 05/20/2018 9:56 AM End time: 05/20/2018 9:58 AM Staffing Resident/CRNA: Lind Covert, CRNA Performed: resident/CRNA  Preanesthetic Checklist Completed: patient identified, site marked, surgical consent, pre-op evaluation, timeout performed, IV checked, risks and benefits discussed and monitors and equipment checked Spinal Block Patient position: sitting Prep: DuraPrep Patient monitoring: heart rate, cardiac monitor, continuous pulse ox and blood pressure Approach: midline Location: L3-4 Injection technique: single-shot Needle Needle type: Pencan  Needle gauge: 24 G Needle length: 10 cm Needle insertion depth: 6 cm Assessment Sensory level: T6 Additional Notes Timeout performed. SAB kit date checked. SAB without difficulty

## 2018-05-20 NOTE — Transfer of Care (Signed)
Immediate Anesthesia Transfer of Care Note  Patient: Sara Doyle  Procedure(s) Performed: RIGHT TOTAL HIP ARTHROPLASTY ANTERIOR APPROACH (Right Hip)  Patient Location: PACU  Anesthesia Type:Spinal  Level of Consciousness: awake, alert , oriented and patient cooperative  Airway & Oxygen Therapy: Patient Spontanous Breathing and Patient connected to face mask oxygen  Post-op Assessment: Report given to RN, Post -op Vital signs reviewed and stable and Patient moving all extremities  Post vital signs: Reviewed and stable  Last Vitals:  Vitals Value Taken Time  BP 89/58 05/20/2018 11:56 AM  Temp    Pulse 77 05/20/2018 11:58 AM  Resp 18 05/20/2018 11:58 AM  SpO2 100 % 05/20/2018 11:58 AM  Vitals shown include unvalidated device data.  Last Pain:  Vitals:   05/20/18 0818  TempSrc:   PainSc: 0-No pain      Patients Stated Pain Goal: 5 (51/46/04 7998)  Complications: No apparent anesthesia complications

## 2018-05-20 NOTE — Anesthesia Postprocedure Evaluation (Signed)
Anesthesia Post Note  Patient: Sara Doyle  Procedure(s) Performed: RIGHT TOTAL HIP ARTHROPLASTY ANTERIOR APPROACH (Right Hip)     Patient location during evaluation: PACU Anesthesia Type: Spinal Level of consciousness: oriented and awake and alert Pain management: pain level controlled Vital Signs Assessment: post-procedure vital signs reviewed and stable Respiratory status: spontaneous breathing, respiratory function stable and patient connected to nasal cannula oxygen Cardiovascular status: blood pressure returned to baseline and stable Postop Assessment: no headache, no backache and no apparent nausea or vomiting Anesthetic complications: no    Last Vitals:  Vitals:   05/20/18 1307 05/20/18 1413  BP: 108/64 108/71  Pulse: 70 62  Resp: 14 16  Temp:  36.5 C  SpO2: 100% 100%    Last Pain:  Vitals:   05/20/18 1413  TempSrc: Oral  PainSc:                  Lidia Collum

## 2018-05-20 NOTE — Evaluation (Signed)
Physical Therapy Evaluation Patient Details Name: Sara Doyle MRN: [id number] DOB: 1946/06/28 Today's Date: 05/20/2018   History of Present Illness  Pt is a 72 YO female s/p R DA-THA on 05/20/18. PMH includes macrocytosis, elevated cholesterol, OA, osteopenia. Surgical history includes R leg fracture surgery 1969 and 2005.  Clinical Impression   Pt s/p R DA-THA. Pt presents with some difficulty with bed mobility, transfers, and ambulation. Overall, pt doing well and ambulating without R hip pain. Pt to benefit from acute PT to continue to progress mobility. Will continue to follow acutely.     Follow Up Recommendations Follow surgeon's recommendation for DC plan and follow-up therapies;Supervision for mobility/OOB(HHPT )    Equipment Recommendations  Crutches(crutches vs cane for stair navigation at home)    Recommendations for Other Services       Precautions / Restrictions Precautions Precautions: Fall Restrictions Weight Bearing Restrictions: No Other Position/Activity Restrictions: WBAT       Mobility  Bed Mobility Overal bed mobility: Needs Assistance Bed Mobility: Supine to Sit     Supine to sit: Min assist;HOB elevated     General bed mobility comments: Min assist for RLE management. Min guard otherwise.   Transfers Overall transfer level: Needs assistance Equipment used: Rolling walker (2 wheeled) Transfers: Sit to/from Stand Sit to Stand: Min guard         General transfer comment: Min guard for safety. Verbal cuing for hand placement.   Ambulation/Gait Ambulation/Gait assistance: Min guard Gait Distance (Feet): 120 Feet Assistive device: Rolling walker (2 wheeled) Gait Pattern/deviations: Step-through pattern;Step-to pattern Gait velocity: normal    General Gait Details: Min guard for safety. Pt assuming step-through gait after first few feet without issue. Verbal cuing for placement in RW.   Stairs            Wheelchair Mobility     Modified Rankin (Stroke Patients Only)       Balance Overall balance assessment: Modified Independent                                           Pertinent Vitals/Pain Pain Assessment: 0-10 Pain Score: 0-No pain    Home Living Family/patient expects to be discharged to:: Private residence Living Arrangements: Spouse/significant other   Type of Home: House Home Access: Stairs to enter Entrance Stairs-Rails: None Entrance Stairs-Number of Steps: 4 to front door, 3 to garage  Home Layout: Two level;Able to live on main level with bedroom/bathroom(Pt states she likes to go up and down the stairs) Home Equipment: Other (comment);Walker - 2 wheels(walking stick with cane head )      Prior Function Level of Independence: Independent with assistive device(s)         Comments: Pt uses walking stick for long duration ambulation, did not use equipment in a typical day.  Pt is a regular exerciser.      Hand Dominance   Dominant Hand: Right    Extremity/Trunk Assessment   Upper Extremity Assessment Upper Extremity Assessment: Overall WFL for tasks assessed    Lower Extremity Assessment Lower Extremity Assessment: Overall WFL for tasks assessed;RLE deficits/detail RLE Deficits / Details: Suspected post-surgical hip weakness; able to perform 5 quad sets, ankle pumps, SLR x5 with AA RLE Sensation: WNL    Cervical / Trunk Assessment Cervical / Trunk Assessment: Normal  Communication   Communication: No difficulties  Cognition Arousal/Alertness: Awake/alert Behavior  During Therapy: WFL for tasks assessed/performed Overall Cognitive Status: Within Functional Limits for tasks assessed                                        General Comments      Exercises Total Joint Exercises Ankle Circles/Pumps: AROM;Both;10 reps;Seated Quad Sets: AROM;Right;5 reps;Supine Heel Slides: AAROM;Right;5 reps;Supine Straight Leg Raises: AAROM;Right;5  reps;Supine   Assessment/Plan    PT Assessment Patient needs continued PT services  PT Problem List Decreased strength;Pain;Decreased range of motion;Decreased knowledge of use of DME;Decreased mobility       PT Treatment Interventions DME instruction;Therapeutic activities;Gait training;Therapeutic exercise;Patient/family education;Stair training;Balance training;Functional mobility training    PT Goals (Current goals can be found in the Care Plan section)  Acute Rehab PT Goals PT Goal Formulation: With patient Time For Goal Achievement: 06/03/18 Potential to Achieve Goals: Good    Frequency 7X/week   Barriers to discharge        Co-evaluation               AM-PAC PT "6 Clicks" Daily Activity  Outcome Measure Difficulty turning over in bed (including adjusting bedclothes, sheets and blankets)?: Unable Difficulty moving from lying on back to sitting on the side of the bed? : Unable Difficulty sitting down on and standing up from a chair with arms (e.g., wheelchair, bedside commode, etc,.)?: Unable Help needed moving to and from a bed to chair (including a wheelchair)?: A Little Help needed walking in hospital room?: A Little Help needed climbing 3-5 steps with a railing? : A Little 6 Click Score: 12    End of Session Equipment Utilized During Treatment: Gait belt Activity Tolerance: Patient tolerated treatment well Patient left: in chair;with chair alarm set;with call bell/phone within reach;with family/visitor present Nurse Communication: Mobility status PT Visit Diagnosis: Other abnormalities of gait and mobility (R26.89);Difficulty in walking, not elsewhere classified (R26.2)    Time: 6195-0932 PT Time Calculation (min) (ACUTE ONLY): 24 min   Charges:   PT Evaluation $PT Eval Low Complexity: 1 Low PT Treatments $Gait Training: 8-22 mins      Alayah Knouff Conception Chancy, PT, DPT  Pager # [phone]    Meko Bellanger D Travelle Mcclimans 05/20/2018, 5:57 PM

## 2018-05-20 NOTE — Care Plan (Signed)
Signed        Spoke with patient prior to surgery. She plans to discharge to home with family and the following arrangements:   DME: walker and 3n1 to be delivered to hospital room by [place]: [facility]: [place] 05/31/18 follow MD appt  MD follow up: 05/31/18 @ 1015   Please contact [place], [place] with questions or if this plan should need to change.   Thanks

## 2018-05-20 NOTE — Discharge Instructions (Signed)

## 2018-05-21 LAB — BASIC METABOLIC PANEL WITH GFR
Anion gap: 9 (ref 5–15)
BUN: 13 mg/dL (ref 8–23)
CO2: 25 mmol/L (ref 22–32)
Calcium: 9 mg/dL (ref 8.9–10.3)
Chloride: 102 mmol/L (ref 98–111)
Creatinine, Ser: 0.54 mg/dL (ref 0.44–1.00)
GFR calc Af Amer: >60 mL/min
GFR calc non Af Amer: >60 mL/min
Glucose, Bld: 178 mg/dL — ABNORMAL HIGH (ref 70–99)
Potassium: 4.8 mmol/L (ref 3.5–5.1)
Sodium: 136 mmol/L (ref 135–145)

## 2018-05-21 LAB — CBC
HCT: 28.5 % — ABNORMAL LOW (ref 36.0–46.0)
Hemoglobin: 9.8 g/dL — ABNORMAL LOW (ref 12.0–15.0)
MCH: 34.1 pg — ABNORMAL HIGH (ref 26.0–34.0)
MCHC: 34.4 g/dL (ref 30.0–36.0)
MCV: 99.3 fL (ref 78.0–100.0)
Platelets: 190 K/uL (ref 150–400)
RBC: 2.87 MIL/uL — ABNORMAL LOW (ref 3.87–5.11)
RDW: 12.9 % (ref 11.5–15.5)
WBC: 9.7 K/uL (ref 4.0–10.5)

## 2018-05-21 NOTE — Op Note (Signed)
NAME: Sara Doyle, Sara Doyle MEDICAL RECORD ZM:[id number] ACCOUNT [id number] DATE OF BIRTH:02-Apr-1946 FACILITY: [facility] LOCATION: [facility] PHYSICIAN:Mikkel Charrette L. Chancey Cullinane, MD  OPERATIVE REPORT  DATE OF PROCEDURE:  05/20/2018  PREOPERATIVE DIAGNOSIS:  End-stage degenerative joint disease, right hip.  POSTOPERATIVE DIAGNOSIS:  End-stage degenerative joint disease, right hip.  PROCEDURE: 1.  Right total hip replacement with a Corail stem size 11, a 52 mm Pinnacle Gription cup, a +4 neutral liner, and a +1.5 delta ceramic hip ball 36 mm. 2.  Interpretation of multiple intraoperative fluoroscopic images.  SURGEON:  [name], MD  ASSISTANT:  Gaspar Skeeters, PA-C  ANESTHESIA:  Spinal.  BRIEF HISTORY:  The patient is a 72 year old female with a long history of significant complaints of right hip pain that has been treated conservatively for a prolonged period of time.  After failure of all conservative care, she was taken to the  operating room for right total hip replacement.  She had end-stage degenerative joint disease by x-ray.  She had bone-on-bone change, night pain and light activity pain.  After failure of all conservative care, she was taken to the operating room for  this procedure.  DESCRIPTION OF PROCEDURE:  The patient was brought to the operating room after adequate anesthesia was obtained with spinal anesthetic and placed supine on the [place] bed.  She was then placed into the boots, then to the table, and she was put in position.   At this point, the right hip was prepped and draped in the usual sterile fashion.  Following this, an incision was made for an anterior approach to the hip, subcutaneous tissue to the level of the tensor fascia divided in line with its fibers.   Retractor was then put in place above and below the hip.  Once this was completed, attention was turned towards opening the capsule, which was opened, and then the anterior capsule was released off the neck.  Once this was  done, the tags were placed in  the capsular sutures.  Provisional neck cut was made, and once this was done, retractors were put in place above and below the acetabulum.  As described, the acetabulum was sequentially reamed to a level of 51 mm, and a 52 mm Pinnacle Gription cup was  hammered into place with 45 degrees of lateral opening and 30 degrees of anteversion.  Once this was completed, attention was turned to the stem side where it was externally rotated, extended and adducted.  At this point, we opened the canal with a  chisel.  We then used a chili pepper, followed by sizing reamers 8, 10, 11.  Eleven went down real easy.  I went with a 12.  It was really getting potted a little bit distally, so we got it down to the edge.  The trial reduction was a +0, and we looked  to be a centimeter plus long.  At that point, we went back to the 11, countersunk it down a fair amount, did a calcar planing and put a +0 ball on.  It looked like we were perfect in terms of leg length and offset.  At this point, we opened an 11, put in  the 11 Corail stem, did a +1.5 delta ceramic hip ball, did a reduction here.  We got our final images.  This was all good.  Exparel was then put throughout the tissues for postoperative pain control.  We did a capsular closure with 0 Vicryl running and  closed the capsule with #1 Vicryl running,  closed the tensor with 0 Vicryl running, skin with 0 and 2-0 Vicryl and 3-0 Monocryl subcuticular.  Benzoin and Steri-Strips were applied.  A sterile compressive dressing was applied, and the patient was taken  to recovery.  She was noted to be in satisfactory condition.  Estimated blood loss for the procedure was 450 mL.  The final can be gotten from the anesthetic record.  Of note, Gaspar Skeeters assisted throughout the case.  He was retracting as well as  manipulating tissue to allow for exposure and did a closure to preserve and limit our time.  Multiple intraoperative fluoroscopic images  taken throughout the case to assess size and fill and location.  LN/NUANCE  D:05/20/2018 T:05/21/2018 JOB:[id number]

## 2018-05-21 NOTE — Care Management Note (Addendum)
Case Management Note  Patient Details  Name: Sara Doyle MRN: [id number] Date of Birth: 02-02-1946  Subjective/Objective:   S/p R DA THA                 Action/Plan: NCM spoke to pt and states she has RW at home. Offered choice for [facility]. Pt agreeable to [facility] with [facility] (preoperatively arranged by surgeon's office). Pt Declined 3n1.  DME: walker and 3n1 to be delivered to hospital room by [place]: [facility] [place]: [place] 05/31/18 follow MD appt  MD follow up: 05/31/18 @ 1015    Expected Discharge Date:  05/21/18               Expected Discharge Plan:  [name]  In-House Referral:  NA  Discharge planning Services  CM Consult  Post Acute Care Choice:  Home Health Choice offered to:  Patient  DME Arranged:  N/A DME Agency:  NA  HH Arranged:  PT [place] Agency:  [facility] [employer]  Status of Service:  Completed, signed off  If discussed at [employer], dates discussed:    Additional Comments:  Erenest Rasher, RN 05/21/2018, 10:28 AM

## 2018-05-21 NOTE — Progress Notes (Signed)
Physical Therapy Treatment Patient Details Name: Sara Doyle MRN: [id number] DOB: 05-04-1946 Today's Date: 05/21/2018    History of Present Illness Pt is a 72 YO female s/p R DA-THA on 05/20/18. PMH includes macrocytosis, elevated cholesterol, OA, osteopenia. Surgical history includes R leg fracture surgery 1969 and 2005.    PT Comments    POD # 1 am session Assisted OOB using a strap too self assist R LE.  Assisted with amb in hallway then returned to room to perform all supine THR TE's following HEP handout.  Instructed on proper tech, freq as well as use of ICE.   Pt will need another PT session to address stairs when spouse arrives.   Follow Up Recommendations  Follow surgeon's recommendation for DC plan and follow-up therapies;Supervision for mobility/OOB     Equipment Recommendations  None recommended by PT    Recommendations for Other Services       Precautions / Restrictions Precautions Precautions: Fall Restrictions Other Position/Activity Restrictions: WBAT     Mobility  Bed Mobility Overal bed mobility: Needs Assistance Bed Mobility: Supine to Sit     Supine to sit: Min guard     General bed mobility comments: demonstarted and instructed how to use a belt to self assist R LE off bed   Transfers Overall transfer level: Needs assistance Equipment used: Rolling walker (2 wheeled) Transfers: Sit to/from Stand Sit to Stand: Min guard;Supervision         General transfer comment: 25% VC's on safety with turns and proper hand placement  Ambulation/Gait Ambulation/Gait assistance: Supervision;Min guard Gait Distance (Feet): 110 Feet Assistive device: Rolling walker (2 wheeled) Gait Pattern/deviations: Step-through pattern;Step-to pattern Gait velocity: WFL   General Gait Details: 25% VC's on proper walker to self distance and safety with turns   Marine scientist Rankin (Stroke Patients Only)        Balance                                            Cognition Arousal/Alertness: Awake/alert Behavior During Therapy: WFL for tasks assessed/performed Overall Cognitive Status: Within Functional Limits for tasks assessed                                        Exercises   Total Hip Replacement TE's 10 reps ankle pumps 10 reps knee presses 10 reps heel slides 10 reps SAQ's 10 reps ABD Followed by ICE     General Comments        Pertinent Vitals/Pain Pain Assessment: 0-10 Pain Score: 3  Pain Location: R hip/groin Pain Descriptors / Indicators: Grimacing;Discomfort;Nagging Pain Intervention(s): Monitored during session;Repositioned;Ice applied;Premedicated before session    Home Living                      Prior Function            PT Goals (current goals can now be found in the care plan section) Progress towards PT goals: Progressing toward goals    Frequency    7X/week      PT Plan Current plan remains appropriate    Co-evaluation  AM-PAC PT "6 Clicks" Daily Activity  Outcome Measure  Difficulty turning over in bed (including adjusting bedclothes, sheets and blankets)?: A Lot Difficulty moving from lying on back to sitting on the side of the bed? : A Lot Difficulty sitting down on and standing up from a chair with arms (e.g., wheelchair, bedside commode, etc,.)?: A Lot Help needed moving to and from a bed to chair (including a wheelchair)?: A Lot Help needed walking in hospital room?: A Lot Help needed climbing 3-5 steps with a railing? : A Lot 6 Click Score: 12    End of Session Equipment Utilized During Treatment: Gait belt Activity Tolerance: Patient tolerated treatment well Patient left: in chair;with chair alarm set;with call bell/phone within reach;with family/visitor present Nurse Communication: Mobility status PT Visit Diagnosis: Other abnormalities of gait and mobility  (R26.89);Difficulty in walking, not elsewhere classified (R26.2)     Time: 1610-9604 PT Time Calculation (min) (ACUTE ONLY): 32 min  Charges:  $Gait Training: 8-22 mins $Therapeutic Exercise: 8-22 mins                     Rica Koyanagi  PTA Acute  Rehabilitation Services Pager      [phone] Office      [phone]

## 2018-05-21 NOTE — Progress Notes (Signed)
Subjective: 1 Day Post-Op Procedure(s) (LRB): RIGHT TOTAL HIP ARTHROPLASTY ANTERIOR APPROACH (Right) Patient reports pain as mild.  Taking by mouth and voiding okay.  Did well with physical therapy.  Ready to go home.  Objective: Vital signs in last 24 hours: Temp:  [97.5 F (36.4 C)-98.5 F (36.9 C)] 97.6 F (36.4 C) (09/14 0913) Pulse Rate:  [56-91] 78 (09/14 0913) Resp:  [11-20] 16 (09/14 0913) BP: (89-108)/(54-79) 95/54 (09/14 0913) SpO2:  [93 %-100 %] 93 % (09/14 0913)  Intake/Output from previous day: 09/13 0701 - 09/14 0700 In: 4476.8 [P.O.:600; I.V.:3515.1; IV Piggyback:361.7] Out: 3625 [Urine:3175; Blood:450] Intake/Output this shift: Total I/O In: 360 [P.O.:360] Out: 500 [Urine:500]  Recent Labs    05/20/18 0801 05/21/18 0350  HGB 13.7 9.8*   Recent Labs    05/20/18 0801 05/21/18 0350  WBC 3.8* 9.7  RBC 4.05 2.87*  HCT 39.9 28.5*  PLT 244 190   Recent Labs    05/20/18 0801 05/21/18 0350  NA 137 136  K 4.1 4.8  CL 99 102  CO2 26 25  BUN 15 13  CREATININE 0.60 0.54  GLUCOSE 89 178*  CALCIUM 9.7 9.0   Recent Labs    05/20/18 0801  INR 0.91  Right hip exam:  Neurovascular intact Sensation intact distally Intact pulses distally Dorsiflexion/Plantar flexion intact Incision: dressing C/D/I Compartment soft   Assessment/Plan: 1 Day Post-Op Procedure(s) (LRB): RIGHT TOTAL HIP ARTHROPLASTY ANTERIOR APPROACH (Right)  Plan: Up with physical therapy this morning weightbearing as tolerated on the right lower extremity. Aspirin 325 mg 1 tablet twice daily 1 month postop for DVT prophylaxis. We will discharge home this a.m. Follow-up with Dr. Berenice Primas in 10-14 days.    Erlene Senters 05/21/2018, 10:04 AM

## 2018-05-21 NOTE — Discharge Summary (Signed)
Patient ID: Sara Doyle MRN: [id number] DOB/AGE: 1946/05/12 72 y.o.  Admit date: 05/20/2018 Discharge date: 05/21/2018  Admission Diagnoses:  Principal Problem:   Primary osteoarthritis of right hip   Discharge Diagnoses:  Same  Past Medical History:  Diagnosis Date  . Allergy     Surgeries: Procedure(s): RIGHT TOTAL HIP ARTHROPLASTY ANTERIOR APPROACH on 05/20/2018 Discharged Condition: Improved  Hospital Course: LAVETTA GEIER is an 72 y.o. female who was admitted 05/20/2018 for operative treatment ofPrimary osteoarthritis of right hip. Patient has severe unremitting pain that affects sleep, daily activities, and work/hobbies. After pre-op clearance the patient was taken to the operating room on 05/20/2018 and underwent  Procedure(s): RIGHT TOTAL HIP ARTHROPLASTY ANTERIOR APPROACH.    Patient was given perioperative antibiotics:  Anti-infectives (From admission, onward)   Start     Dose/Rate Route Frequency Ordered Stop   05/20/18 1600  ceFAZolin (ANCEF) IVPB 2g/100 mL premix     2 g 200 mL/hr over 30 Minutes Intravenous Every 6 hours 05/20/18 1318 05/20/18 2211   05/20/18 0800  ceFAZolin (ANCEF) IVPB 2g/100 mL premix     2 g 200 mL/hr over 30 Minutes Intravenous On call to O.R. 05/20/18 0750 05/20/18 0959       Patient was given sequential compression devices, early ambulation, and chemoprophylaxis to prevent DVT.  Patient benefited maximally from hospital stay and there were no complications.    Recent vital signs:  Patient Vitals for the past 24 hrs:  BP Temp Temp src Pulse Resp SpO2  05/21/18 0913 (!) 95/54 97.6 F (36.4 C) Oral 78 16 93 %  05/21/18 0616 103/69 98.1 F (36.7 C) Oral 84 - 98 %  05/21/18 0125 98/60 98 F (36.7 C) Oral 77 - 96 %  05/20/18 2144 101/67 (!) 97.5 F (36.4 C) Oral 85 15 95 %  05/20/18 1745 99/69 - - 67 14 95 %  05/20/18 1614 107/79 98.5 F (36.9 C) Oral 91 16 98 %  05/20/18 1512 104/75 (!) 97.5 F (36.4 C) Oral 61 14 100 %   05/20/18 1413 108/71 97.7 F (36.5 C) Oral 62 16 100 %  05/20/18 1307 108/64 - - 70 14 100 %  05/20/18 1245 94/73 (!) 97.5 F (36.4 C) - 68 14 100 %  05/20/18 1230 107/66 - - 76 11 100 %  05/20/18 1215 (!) 90/57 - - 75 12 100 %  05/20/18 1200 (!) 92/59 - - 78 20 100 %  05/20/18 1157 (!) 89/58 (!) 97.5 F (36.4 C) - (!) 56 13 100 %     Recent laboratory studies:  Recent Labs    05/20/18 0801 05/21/18 0350  WBC 3.8* 9.7  HGB 13.7 9.8*  HCT 39.9 28.5*  PLT 244 190  NA 137 136  K 4.1 4.8  CL 99 102  CO2 26 25  BUN 15 13  CREATININE 0.60 0.54  GLUCOSE 89 178*  INR 0.91  --   CALCIUM 9.7 9.0     Discharge Medications:   Allergies as of 05/21/2018      Reactions   Oysters [shellfish Allergy] Anaphylaxis, Swelling   Tongue swelling/difficulty breathing   Demerol [meperidine] Nausea And Vomiting      Medication List    STOP taking these medications   Biotin 5000 MCG Tabs   calcium-vitamin D 500-200 MG-UNIT tablet Commonly known as:  OSCAL WITH D   ibuprofen 200 MG tablet Commonly known as:  ADVIL,MOTRIN   MELATONIN PO   meloxicam 15 MG  tablet Commonly known as:  MOBIC   naproxen sodium 220 MG tablet Commonly known as:  ALEVE     TAKE these medications   aspirin EC 325 MG tablet Take 1 tablet (325 mg total) by mouth 2 (two) times daily after a meal. Take x 1 month post op to decrease risk of blood clots.   docusate sodium 100 MG capsule Commonly known as:  COLACE Take 1 capsule (100 mg total) by mouth 2 (two) times daily.   HYDROcodone-acetaminophen 10-325 MG tablet Commonly known as:  NORCO Take 1-2 tablets by mouth every 6 (six) hours as needed.   tiZANidine 2 MG tablet Commonly known as:  ZANAFLEX Take 1 tablet (2 mg total) by mouth every 8 (eight) hours as needed for muscle spasms.            Discharge Care Instructions  (From admission, onward)         Start     Ordered   05/21/18 0000  Weight bearing as tolerated    Question Answer  Comment  Laterality right   Extremity Lower      05/21/18 1007   05/21/18 0000  Weight bearing as tolerated    Question Answer Comment  Laterality right   Extremity Lower      05/21/18 1011          Diagnostic Studies: Dg Chest 2 View  Result Date: 05/20/2018 CLINICAL DATA:  Pre surgery evaluation. EXAM: CHEST - 2 VIEW COMPARISON:  None. FINDINGS: Normal sized heart. Small calcified granulomata bilateral. Otherwise, clear lungs. The lungs are mildly hyperexpanded with mild peribronchial thickening. Mild scoliosis and mild thoracic spine degenerative changes. The bones appear somewhat osteopenic. IMPRESSION: Mild changes of COPD and chronic bronchitis. No acute abnormality. Electronically Signed   By: Claudie Revering M.D.   On: 05/20/2018 09:30   Dg C-arm 1-60 Min-no Report  Result Date: 05/20/2018 Fluoroscopy was utilized by the requesting physician.  No radiographic interpretation.   Dg Hip Operative Unilat W Or W/o Pelvis Right  Result Date: 05/20/2018 CLINICAL DATA:  Surgery EXAM: OPERATIVE RIGHT HIP (WITH PELVIS IF PERFORMED)  VIEWS TECHNIQUE: Fluoroscopic spot image(s) were submitted for interpretation post-operatively. COMPARISON:  None. FINDINGS: Two intraoperative fluoroscopic spot images demonstrated RIGHT hip arthroplasty hardware. Hardware appears intact and appropriately positioned. Fluoroscopy was provided for 35 seconds (1.47 mGy). IMPRESSION: Intraoperative fluoroscopic images demonstrating RIGHT hip arthroplasty hardware. No evidence of surgical complicating feature. Electronically Signed   By: Franki Cabot M.D.   On: 05/20/2018 11:54    Disposition: Discharge disposition: 01-Home or Self Care       Discharge Instructions    Call MD / Call 911   Complete by:  As directed    If you experience chest pain or shortness of breath, CALL 911 and be transported to the hospital emergency room.  If you develope a fever above 101 F, pus (white drainage) or increased drainage  or redness at the wound, or calf pain, call your surgeon's office.   Call MD / Call 911   Complete by:  As directed    If you experience chest pain or shortness of breath, CALL 911 and be transported to the hospital emergency room.  If you develope a fever above 101 F, pus (white drainage) or increased drainage or redness at the wound, or calf pain, call your surgeon's office.   Constipation Prevention   Complete by:  As directed    Drink plenty of fluids.  Prune juice may  be helpful.  You may use a stool softener, such as Colace (over the counter) 100 mg twice a day.  Use MiraLax (over the counter) for constipation as needed.   Constipation Prevention   Complete by:  As directed    Drink plenty of fluids.  Prune juice may be helpful.  You may use a stool softener, such as Colace (over the counter) 100 mg twice a day.  Use MiraLax (over the counter) for constipation as needed.   Diet general   Complete by:  As directed    Increase activity slowly as tolerated   Complete by:  As directed    Increase activity slowly as tolerated   Complete by:  As directed    Weight bearing as tolerated   Complete by:  As directed    Laterality:  right   Extremity:  Lower   Weight bearing as tolerated   Complete by:  As directed    Laterality:  right   Extremity:  Lower      Follow-up Information    Gary Fleet, PA-C. Schedule an appointment as soon as possible for a visit in 10 days.   Specialty:  Orthopedic Surgery Contact information: [address] [place] [address] [phone]            Signed: Erlene Senters 05/21/2018, 10:11 AM

## 2018-05-21 NOTE — Progress Notes (Signed)
Physical Therapy Treatment Patient Details Name: Sara Doyle MRN: [id number] DOB: [birth year] Today's Date: 05/21/2018    History of Present Illness Pt is a 72 YO female s/p R DA-THA on 05/20/18. PMH includes macrocytosis, elevated cholesterol, OA, osteopenia. Surgical history includes R leg fracture surgery 1969 and 2005.    PT Comments    POD # 1 pm session Spouse present during session for "hands on" instruction all mobility mentioned below.  Educated on activity level and safety at home. Pt has voided and met goals to D/C to home today.   Follow Up Recommendations  Follow surgeon's recommendation for DC plan and follow-up therapies;Supervision for mobility/OOB     Equipment Recommendations  None recommended by PT    Recommendations for Other Services       Precautions / Restrictions Precautions Precautions: Fall Restrictions Other Position/Activity Restrictions: WBAT     Mobility  Bed Mobility Overal bed mobility: Needs Assistance Bed Mobility: Supine to Sit     Supine to sit: Min guard     General bed mobility comments: Pt OOB in recliner   Transfers Overall transfer level: Needs assistance Equipment used: Rolling walker (2 wheeled) Transfers: Sit to/from Stand Sit to Stand: Min guard;Supervision         General transfer comment: 25% VC's on safety with turns and proper hand placement  Ambulation/Gait Ambulation/Gait assistance: Supervision;Min guard Gait Distance (Feet): 245 Feet Assistive device: Rolling walker (2 wheeled) Gait Pattern/deviations: Step-through pattern;Step-to pattern Gait velocity: WFL   General Gait Details: 25% VC's on proper walker to self distance and safety with turns with spouse present for "hands on" instruction on safe handling   Stairs Stairs: Yes Stairs assistance: Min assist Stair Management: No rails;Step to pattern;With walker Number of Stairs: 2 General stair comments: 25% VC's on proper safe tech using walker  forwward with spouse behind securing pt and walker as pt performs stairs.     Wheelchair Mobility    Modified Rankin (Stroke Patients Only)       Balance                                            Cognition Arousal/Alertness: Awake/alert Behavior During Therapy: WFL for tasks assessed/performed Overall Cognitive Status: Within Functional Limits for tasks assessed                                        Exercises      General Comments        Pertinent Vitals/Pain Pain Assessment: 0-10 Pain Score: 3  Pain Location: R hip/groin Pain Descriptors / Indicators: Grimacing;Discomfort;Nagging Pain Intervention(s): Monitored during session;Repositioned;Ice applied;Premedicated before session    Home Living                      Prior Function            PT Goals (current goals can now be found in the care plan section) Progress towards PT goals: Progressing toward goals    Frequency    7X/week      PT Plan Current plan remains appropriate    Co-evaluation              AM-PAC PT "6 Clicks" Daily Activity  Outcome Measure  Difficulty turning over in bed (  including adjusting bedclothes, sheets and blankets)?: A Lot Difficulty moving from lying on back to sitting on the side of the bed? : A Lot Difficulty sitting down on and standing up from a chair with arms (e.g., wheelchair, bedside commode, etc,.)?: A Lot Help needed moving to and from a bed to chair (including a wheelchair)?: A Lot Help needed walking in hospital room?: A Lot Help needed climbing 3-5 steps with a railing? : A Lot 6 Click Score: 12    End of Session Equipment Utilized During Treatment: Gait belt Activity Tolerance: Patient tolerated treatment well Patient left: in chair;with chair alarm set;with call bell/phone within reach;with family/visitor present Nurse Communication: Mobility status PT Visit Diagnosis: Other abnormalities of gait and  mobility (R26.89);Difficulty in walking, not elsewhere classified (R26.2)     Time: 1660-6004 PT Time Calculation (min) (ACUTE ONLY): 25 min  Charges:  $Gait Training: 8-22 mins $Therapeutic Activity: 8-22 mins                     Rica Koyanagi  PTA Acute  Rehabilitation Services Pager      [phone] Office      [phone]

## 2018-05-21 NOTE — Plan of Care (Signed)
Pt stable this am with no needs. Pt did very well with Physical therapy. Pt to d/c home today.

## 2018-05-21 NOTE — Progress Notes (Signed)
Pt d/c home with family. No needs at time of d/c. Pt denied pain at time of d/c. No questions on d/c instructions and education given.

## 2018-05-22 DIAGNOSIS — Z471 Aftercare following joint replacement surgery: Secondary | ICD-10-CM | POA: Diagnosis not present

## 2018-05-22 DIAGNOSIS — Z87891 Personal history of nicotine dependence: Secondary | ICD-10-CM | POA: Diagnosis not present

## 2018-05-22 DIAGNOSIS — Z96641 Presence of right artificial hip joint: Secondary | ICD-10-CM | POA: Diagnosis not present

## 2018-05-22 DIAGNOSIS — M8580 Other specified disorders of bone density and structure, unspecified site: Secondary | ICD-10-CM | POA: Diagnosis not present

## 2018-05-23 ENCOUNTER — Encounter (HOSPITAL_COMMUNITY): Payer: Self-pay | Admitting: Orthopedic Surgery

## 2018-05-25 DIAGNOSIS — M8580 Other specified disorders of bone density and structure, unspecified site: Secondary | ICD-10-CM | POA: Diagnosis not present

## 2018-05-25 DIAGNOSIS — Z87891 Personal history of nicotine dependence: Secondary | ICD-10-CM | POA: Diagnosis not present

## 2018-05-25 DIAGNOSIS — Z471 Aftercare following joint replacement surgery: Secondary | ICD-10-CM | POA: Diagnosis not present

## 2018-05-25 DIAGNOSIS — Z96641 Presence of right artificial hip joint: Secondary | ICD-10-CM | POA: Diagnosis not present

## 2018-05-26 ENCOUNTER — Encounter: Payer: Self-pay | Admitting: Family Medicine

## 2018-05-27 DIAGNOSIS — Z471 Aftercare following joint replacement surgery: Secondary | ICD-10-CM | POA: Diagnosis not present

## 2018-05-27 DIAGNOSIS — Z87891 Personal history of nicotine dependence: Secondary | ICD-10-CM | POA: Diagnosis not present

## 2018-05-27 DIAGNOSIS — M8580 Other specified disorders of bone density and structure, unspecified site: Secondary | ICD-10-CM | POA: Diagnosis not present

## 2018-05-27 DIAGNOSIS — Z96641 Presence of right artificial hip joint: Secondary | ICD-10-CM | POA: Diagnosis not present

## 2018-05-30 DIAGNOSIS — Z87891 Personal history of nicotine dependence: Secondary | ICD-10-CM | POA: Diagnosis not present

## 2018-05-30 DIAGNOSIS — Z471 Aftercare following joint replacement surgery: Secondary | ICD-10-CM | POA: Diagnosis not present

## 2018-05-30 DIAGNOSIS — M8580 Other specified disorders of bone density and structure, unspecified site: Secondary | ICD-10-CM | POA: Diagnosis not present

## 2018-05-30 DIAGNOSIS — Z96641 Presence of right artificial hip joint: Secondary | ICD-10-CM | POA: Diagnosis not present

## 2018-05-31 DIAGNOSIS — M6281 Muscle weakness (generalized): Secondary | ICD-10-CM | POA: Diagnosis not present

## 2018-05-31 DIAGNOSIS — Z96641 Presence of right artificial hip joint: Secondary | ICD-10-CM | POA: Diagnosis not present

## 2018-05-31 DIAGNOSIS — M1611 Unilateral primary osteoarthritis, right hip: Secondary | ICD-10-CM | POA: Diagnosis not present

## 2018-06-01 DIAGNOSIS — M6281 Muscle weakness (generalized): Secondary | ICD-10-CM | POA: Diagnosis not present

## 2018-06-01 DIAGNOSIS — Z96641 Presence of right artificial hip joint: Secondary | ICD-10-CM | POA: Diagnosis not present

## 2018-06-07 DIAGNOSIS — Z96641 Presence of right artificial hip joint: Secondary | ICD-10-CM | POA: Diagnosis not present

## 2018-06-07 DIAGNOSIS — M6281 Muscle weakness (generalized): Secondary | ICD-10-CM | POA: Diagnosis not present

## 2018-06-09 DIAGNOSIS — Z96641 Presence of right artificial hip joint: Secondary | ICD-10-CM | POA: Diagnosis not present

## 2018-06-09 DIAGNOSIS — M6281 Muscle weakness (generalized): Secondary | ICD-10-CM | POA: Diagnosis not present

## 2018-06-14 DIAGNOSIS — M6281 Muscle weakness (generalized): Secondary | ICD-10-CM | POA: Diagnosis not present

## 2018-06-14 DIAGNOSIS — Z96641 Presence of right artificial hip joint: Secondary | ICD-10-CM | POA: Diagnosis not present

## 2018-06-16 DIAGNOSIS — M6281 Muscle weakness (generalized): Secondary | ICD-10-CM | POA: Diagnosis not present

## 2018-06-16 DIAGNOSIS — Z96641 Presence of right artificial hip joint: Secondary | ICD-10-CM | POA: Diagnosis not present

## 2018-06-21 DIAGNOSIS — Z96641 Presence of right artificial hip joint: Secondary | ICD-10-CM | POA: Diagnosis not present

## 2018-06-21 DIAGNOSIS — M6281 Muscle weakness (generalized): Secondary | ICD-10-CM | POA: Diagnosis not present

## 2018-06-23 DIAGNOSIS — M6281 Muscle weakness (generalized): Secondary | ICD-10-CM | POA: Diagnosis not present

## 2018-06-23 DIAGNOSIS — Z96641 Presence of right artificial hip joint: Secondary | ICD-10-CM | POA: Diagnosis not present

## 2018-06-27 DIAGNOSIS — Z471 Aftercare following joint replacement surgery: Secondary | ICD-10-CM | POA: Diagnosis not present

## 2018-07-04 DIAGNOSIS — Z23 Encounter for immunization: Secondary | ICD-10-CM | POA: Diagnosis not present

## 2018-08-03 ENCOUNTER — Encounter: Payer: Self-pay | Admitting: Family Medicine

## 2018-08-03 DIAGNOSIS — Z96641 Presence of right artificial hip joint: Secondary | ICD-10-CM

## 2018-08-03 HISTORY — DX: Presence of right artificial hip joint: Z96.641

## 2018-08-22 DIAGNOSIS — M1611 Unilateral primary osteoarthritis, right hip: Secondary | ICD-10-CM | POA: Diagnosis not present

## 2018-10-14 ENCOUNTER — Other Ambulatory Visit: Payer: Self-pay

## 2018-10-14 ENCOUNTER — Ambulatory Visit (INDEPENDENT_AMBULATORY_CARE_PROVIDER_SITE_OTHER): Payer: Medicare Other

## 2018-10-14 VITALS — BP 132/70 | HR 77 | Ht 62.0 in | Wt 133.2 lb

## 2018-10-14 DIAGNOSIS — Z0000 Encounter for general adult medical examination without abnormal findings: Secondary | ICD-10-CM | POA: Diagnosis not present

## 2018-10-14 DIAGNOSIS — E2839 Other primary ovarian failure: Secondary | ICD-10-CM

## 2018-10-14 DIAGNOSIS — Z1231 Encounter for screening mammogram for malignant neoplasm of breast: Secondary | ICD-10-CM

## 2018-10-14 DIAGNOSIS — Z23 Encounter for immunization: Secondary | ICD-10-CM

## 2018-10-14 MED ORDER — ZOSTER VAC RECOMB ADJUVANTED 50 MCG/0.5ML IM SUSR: 0.5000 mL | Freq: Once | INTRAMUSCULAR | 1 refills | Status: AC

## 2018-10-14 NOTE — Patient Instructions (Addendum)
Dr Marla Roe [phone].   Shingles vaccine at pharmacy.   Schedule mammogram and DEXA scan.  Continue doing brain stimulating activities (puzzles, reading, adult coloring books, staying active) to keep memory sharp.    Health Maintenance, Female Adopting a healthy lifestyle and getting preventive care can go a long way to promote health and wellness. Talk with your health care provider about what schedule of regular examinations is right for you. This is a good chance for you to check in with your provider about disease prevention and staying healthy. In between checkups, there are plenty of things you can do on your own. Experts have done a lot of research about which lifestyle changes and preventive measures are most likely to keep you healthy. Ask your health care provider for more information. Weight and diet Eat a healthy diet  Be sure to include plenty of vegetables, fruits, low-fat dairy products, and lean protein.  Do not eat a lot of foods high in solid fats, added sugars, or salt.  Get regular exercise. This is one of the most important things you can do for your health. ? Most adults should exercise for at least 150 minutes each week. The exercise should increase your heart rate and make you sweat (moderate-intensity exercise). ? Most adults should also do strengthening exercises at least twice a week. This is in addition to the moderate-intensity exercise. Maintain a healthy weight  Body mass index (BMI) is a measurement that can be used to identify possible weight problems. It estimates body fat based on height and weight. Your health care provider can help determine your BMI and help you achieve or maintain a healthy weight.  For females 54 years of age and older: ? A BMI below 18.5 is considered underweight. ? A BMI of 18.5 to 24.9 is normal. ? A BMI of 25 to 29.9 is considered overweight. ? A BMI of 30 and above is considered obese. Watch levels of cholesterol and  blood lipids  You should start having your blood tested for lipids and cholesterol at 74 years of age, then have this test every 5 years.  You may need to have your cholesterol levels checked more often if: ? Your lipid or cholesterol levels are high. ? You are older than 74 years of age. ? You are at high risk for heart disease. Cancer screening Lung Cancer  Lung cancer screening is recommended for adults 14-73 years old who are at high risk for lung cancer because of a history of smoking.  A yearly low-dose CT scan of the lungs is recommended for people who: ? Currently smoke. ? Have quit within the past 15 years. ? Have at least a 30-pack-year history of smoking. A pack year is smoking an average of one pack of cigarettes a day for 1 year.  Yearly screening should continue until it has been 15 years since you quit.  Yearly screening should stop if you develop a health problem that would prevent you from having lung cancer treatment. Breast Cancer  Practice breast self-awareness. This means understanding how your breasts normally appear and feel.  It also means doing regular breast self-exams. Let your health care provider know about any changes, no matter how small.  If you are in your 20s or 30s, you should have a clinical breast exam (CBE) by a health care provider every 1-3 years as part of a regular health exam.  If you are 34 or older, have a CBE every year. Also consider having  a breast X-ray (mammogram) every year.  If you have a family history of breast cancer, talk to your health care provider about genetic screening.  If you are at high risk for breast cancer, talk to your health care provider about having an MRI and a mammogram every year.  Breast cancer gene (BRCA) assessment is recommended for women who have family members with BRCA-related cancers. BRCA-related cancers include: ? Breast. ? Ovarian. ? Tubal. ? Peritoneal cancers.  Results of the assessment  will determine the need for genetic counseling and BRCA1 and BRCA2 testing. Cervical Cancer Your health care provider may recommend that you be screened regularly for cancer of the pelvic organs (ovaries, uterus, and vagina). This screening involves a pelvic examination, including checking for microscopic changes to the surface of your cervix (Pap test). You may be encouraged to have this screening done every 3 years, beginning at age 69.  For women ages 49-65, health care providers may recommend pelvic exams and Pap testing every 3 years, or they may recommend the Pap and pelvic exam, combined with testing for human papilloma virus (HPV), every 5 years. Some types of HPV increase your risk of cervical cancer. Testing for HPV may also be done on women of any age with unclear Pap test results.  Other health care providers may not recommend any screening for nonpregnant women who are considered low risk for pelvic cancer and who do not have symptoms. Ask your health care provider if a screening pelvic exam is right for you.  If you have had past treatment for cervical cancer or a condition that could lead to cancer, you need Pap tests and screening for cancer for at least 20 years after your treatment. If Pap tests have been discontinued, your risk factors (such as having a new sexual partner) need to be reassessed to determine if screening should resume. Some women have medical problems that increase the chance of getting cervical cancer. In these cases, your health care provider may recommend more frequent screening and Pap tests. Colorectal Cancer  This type of cancer can be detected and often prevented.  Routine colorectal cancer screening usually begins at 74 years of age and continues through 74 years of age.  Your health care provider may recommend screening at an earlier age if you have risk factors for colon cancer.  Your health care provider may also recommend using home test kits to check  for hidden blood in the stool.  A small camera at the end of a tube can be used to examine your colon directly (sigmoidoscopy or colonoscopy). This is done to check for the earliest forms of colorectal cancer.  Routine screening usually begins at age 58.  Direct examination of the colon should be repeated every 5-10 years through 74 years of age. However, you may need to be screened more often if early forms of precancerous polyps or small growths are found. Skin Cancer  Check your skin from head to toe regularly.  Tell your health care provider about any new moles or changes in moles, especially if there is a change in a mole's shape or color.  Also tell your health care provider if you have a mole that is larger than the size of a pencil eraser.  Always use sunscreen. Apply sunscreen liberally and repeatedly throughout the day.  Protect yourself by wearing long sleeves, pants, a wide-brimmed hat, and sunglasses whenever you are outside. Heart disease, diabetes, and high blood pressure  High blood pressure causes  heart disease and increases the risk of stroke. High blood pressure is more likely to develop in: ? People who have blood pressure in the high end of the normal range (130-139/85-89 mm Hg). ? People who are overweight or obese. ? People who are African American.  If you are 11-43 years of age, have your blood pressure checked every 3-5 years. If you are 42 years of age or older, have your blood pressure checked every year. You should have your blood pressure measured twice-once when you are at a hospital or clinic, and once when you are not at a hospital or clinic. Record the average of the two measurements. To check your blood pressure when you are not at a hospital or clinic, you can use: ? An automated blood pressure machine at a pharmacy. ? A home blood pressure monitor.  If you are between 64 years and 79 years old, ask your health care provider if you should take aspirin  to prevent strokes.  Have regular diabetes screenings. This involves taking a blood sample to check your fasting blood sugar level. ? If you are at a normal weight and have a low risk for diabetes, have this test once every three years after 74 years of age. ? If you are overweight and have a high risk for diabetes, consider being tested at a younger age or more often. Preventing infection Hepatitis B  If you have a higher risk for hepatitis B, you should be screened for this virus. You are considered at high risk for hepatitis B if: ? You were born in a country where hepatitis B is common. Ask your health care provider which countries are considered high risk. ? Your parents were born in a high-risk country, and you have not been immunized against hepatitis B (hepatitis B vaccine). ? You have HIV or AIDS. ? You use needles to inject street drugs. ? You live with someone who has hepatitis B. ? You have had sex with someone who has hepatitis B. ? You get hemodialysis treatment. ? You take certain medicines for conditions, including cancer, organ transplantation, and autoimmune conditions. Hepatitis C  Blood testing is recommended for: ? Everyone born from [birth year] through 1966. ? Anyone with known risk factors for hepatitis C. Sexually transmitted infections (STIs)  You should be screened for sexually transmitted infections (STIs) including gonorrhea and chlamydia if: ? You are sexually active and are younger than 74 years of age. ? You are older than 74 years of age and your health care provider tells you that you are at risk for this type of infection. ? Your sexual activity has changed since you were last screened and you are at an increased risk for chlamydia or gonorrhea. Ask your health care provider if you are at risk.  If you do not have HIV, but are at risk, it may be recommended that you take a prescription medicine daily to prevent HIV infection. This is called pre-exposure  prophylaxis (PrEP). You are considered at risk if: ? You are sexually active and do not regularly use condoms or know the HIV status of your partner(s). ? You take drugs by injection. ? You are sexually active with a partner who has HIV. Talk with your health care provider about whether you are at high risk of being infected with HIV. If you choose to begin PrEP, you should first be tested for HIV. You should then be tested every 3 months for as long as you are taking  PrEP. Pregnancy  If you are premenopausal and you may become pregnant, ask your health care provider about preconception counseling.  If you may become pregnant, take 400 to 800 micrograms (mcg) of folic acid every day.  If you want to prevent pregnancy, talk to your health care provider about birth control (contraception). Osteoporosis and menopause  Osteoporosis is a disease in which the bones lose minerals and strength with aging. This can result in serious bone fractures. Your risk for osteoporosis can be identified using a bone density scan.  If you are 69 years of age or older, or if you are at risk for osteoporosis and fractures, ask your health care provider if you should be screened.  Ask your health care provider whether you should take a calcium or vitamin D supplement to lower your risk for osteoporosis.  Menopause may have certain physical symptoms and risks.  Hormone replacement therapy may reduce some of these symptoms and risks. Talk to your health care provider about whether hormone replacement therapy is right for you. Follow these instructions at home:  Schedule regular health, dental, and eye exams.  Stay current with your immunizations.  Do not use any tobacco products including cigarettes, chewing tobacco, or electronic cigarettes.  If you are pregnant, do not drink alcohol.  If you are breastfeeding, limit how much and how often you drink alcohol.  Limit alcohol intake to no more than 1 drink  per day for nonpregnant women. One drink equals 12 ounces of beer, 5 ounces of wine, or 1 ounces of hard liquor.  Do not use street drugs.  Do not share needles.  Ask your health care provider for help if you need support or information about quitting drugs.  Tell your health care provider if you often feel depressed.  Tell your health care provider if you have ever been abused or do not feel safe at home. This information is not intended to replace advice given to you by your health care provider. Make sure you discuss any questions you have with your health care provider. Document Released: 03/09/2011 Document Revised: 01/30/2016 Document Reviewed: 05/28/2015 Elsevier Interactive Patient Education  2019 [employer].

## 2018-10-14 NOTE — Progress Notes (Signed)
Subjective:   Sara Doyle is a 73 y.o. female who presents for Medicare Annual (Subsequent) preventive examination.  Review of Systems:  No ROS.  Medicare Wellness Visit. Additional risk factors are reflected in the social history.  Cardiac Risk Factors include: advanced age (>40men, >2 women)   Sleep patterns:  Sleeps 6-7 hours. Melatonin occasionally.   Home Safety/Smoke Alarms: Feels safe in home. Smoke alarms in place.  Living environment; residence and Firearm Safety: Lives with husband in 2 story home. Master on first floor. Rails at most steps.  Seat Belt Safety/Bike Helmet: Wears seat belt.   Female:   Pap-N/A     Mammo-11/30/2016,  BI-RADS CATEGORY 1: Negative.  Ordered today. GI ctr, breast center . Dexa scan-11/29/2015, Osteopenia.  Ordered today. GI Ctr      [facility]-Colonoscopy 08/09/2009, normal. Recall 10 years     Objective:     Vitals: BP 132/70 (BP Location: Left Arm, Patient Position: Sitting, Cuff Size: Normal)   Pulse 77   Ht 5\' 2"  (1.575 m)   Wt 133 lb 4 oz (60.4 kg)   SpO2 97%   BMI 24.37 kg/m   Body mass index is 24.37 kg/m.  Advanced Directives 10/14/2018 05/20/2018 05/12/2018 10/11/2017 04/13/2017 10/05/2016  Does Patient Have a Medical Advance Directive? Yes No No No No No  Type of Paramedic of [place];Living will - - - - -  Copy of [place] in Chart? Yes - validated most recent copy scanned in chart (See row information) - - - - -  Would patient like information on creating a medical advance directive? - No - Patient declined No - Patient declined No - Patient declined No - Patient declined No - Patient declined    Tobacco Social History   Tobacco Use  Smoking Status Former Smoker  . Packs/day: 1.00  . Years: 25.00  . Pack years: 25.00  . Types: Cigarettes  . Last attempt to quit: 09/07/1993  . Years since quitting: 25.1  Smokeless Tobacco Never Used     Counseling given: Not Answered   Past  Medical History:  Diagnosis Date  . Allergy    Past Surgical History:  Procedure Laterality Date  . FRACTURE SURGERY  1969   right leg  . FRACTURE SURGERY  2005   right leg  . SKIN LESION EXCISION Right 07/08/2017   Behind right knee, benign  . 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 TUBAL LIGATION     Family History  Problem Relation Age of Onset  . Hypertension Mother   . Arthritis Father        osteo  . Heart disease Brother        congenital  . Breast cancer Sister 87       breast  . Arthritis Sister   . Hypertension Sister   . Heart disease Brother   . Cancer Brother        prostate, colon  . Arrhythmia Brother        a-fib  . Heart disease Brother    Social History   Socioeconomic History  . Marital status: Married    Spouse name: Not on file  . Number of children: 3  . Years of education: Not on file  .  Highest education level: Not on file  Occupational History    Comment: retired  Scientific laboratory technician  . Financial resource strain: Not on file  . Food insecurity:    Worry: Not on file    Inability: Not on file  . Transportation needs:    Medical: Not on file    Non-medical: Not on file  Tobacco Use  . Smoking status: Former Smoker    Packs/day: 1.00    Years: 25.00    Pack years: 25.00    Types: Cigarettes    Last attempt to quit: 09/07/1993    Years since quitting: 25.1  . Smokeless tobacco: Never Used  Substance and Sexual Activity  . Alcohol use: Yes    Alcohol/week: 4.0 standard drinks    Types: 4 Glasses of wine per week    Comment: 4-5 glasses/week  . Drug use: No  . Sexual activity: Yes    Birth control/protection: Post-menopausal  Lifestyle  . Physical activity:    Days per week: Not on file    Minutes per session: Not on file  .  Stress: Not on file  Relationships  . Social connections:    Talks on phone: Not on file    Gets together: Not on file    Attends religious service: Not on file    Active member of club or organization: Not on file    Attends meetings of clubs or organizations: Not on file    Relationship status: Not on file  Other Topics Concern  . Not on file  Social History Narrative   Ms. Muench lives with her husband, She relocated from [place] Nov., 2015. She is retired, has 3 grown sons that live in [place].   Smoke alarm in the home, wears her seatbelt, uses sunscreen.   Ambulates independently. No dentures.   Has dentist with regular appts.    Feels safe in her relationships    Outpatient Encounter Medications as of 10/14/2018  Medication Sig  . Biotin 5000 MCG CAPS Take by mouth.  . Calcium Carb-Ergocalciferol 250-125 MG-UNIT TABS Take by mouth.  . Melatonin 5 MG CAPS Take by mouth as needed.  . Multiple Vitamins-Minerals (LUTEIN-ZEAXANTHIN PO) Take by mouth.  . docusate sodium (COLACE) 100 MG capsule Take 1 capsule (100 mg total) by mouth 2 (two) times daily. (Patient not taking: Reported on 10/14/2018)  . Zoster Vaccine Adjuvanted [facility]) injection Inject 0.5 mLs into the muscle once for 1 dose.  . [DISCONTINUED] aspirin EC 325 MG tablet Take 1 tablet (325 mg total) by mouth 2 (two) times daily after a meal. Take x 1 month post op to decrease risk of blood clots.  . [DISCONTINUED] HYDROcodone-acetaminophen (NORCO) 10-325 MG tablet Take 1-2 tablets by mouth every 6 (six) hours as needed. (Patient not taking: Reported on 10/14/2018)  . [DISCONTINUED] tiZANidine (ZANAFLEX) 2 MG tablet Take 1 tablet (2 mg total) by mouth every 8 (eight) hours as needed for muscle spasms.   No facility-administered encounter medications on file as of 10/14/2018.     Activities of Daily Living In your present state of health, do you have any difficulty performing the following activities: 10/14/2018 05/20/2018    Hearing? N N  Vision? N N  Difficulty concentrating or making decisions? N N  Walking or climbing stairs? Y Y  Comment right hip surgery Related to R hip  Dressing or bathing? N N  Doing errands, shopping? N N  Preparing Food and eating ? N -  Using  the Toilet? N -  In the past six months, have you accidently leaked urine? N -  Do you have problems with loss of bowel control? N -  Managing your Medications? N -  Managing your Finances? N -  Housekeeping or managing your Housekeeping? N -  Some recent data might be hidden    Patient Care Team: Ma Hillock, DO as PCP - General (Family Medicine) Milus Banister, MD as Attending Physician (Gastroenterology) Rutherford Guys, MD as Consulting Physician (Ophthalmology) Sallyanne Kuster (Dentistry) Dickie La, MD as Consulting Physician (Family Medicine)    Assessment:   This is a routine wellness examination for [place].  Exercise Activities and Dietary recommendations Current Exercise Habits: Home exercise routine, Type of exercise: strength training/weights;Other - see comments;treadmill(eliptical ), Time (Minutes): 60, Frequency (Times/Week): 3, Weekly Exercise (Minutes/Week): 180, Exercise limited by: orthopedic condition(s)   Diet (meal preparation, eat out, water intake, caffeinated beverages, dairy products, fruits and vegetables): Drinks decaf coffee, 32-40 oz water.   Brunch: oatmeal; yogurt/blueberries Dinner:   Protein/veggies  Heart healthy diet.    Goals    . Patient Stated     Stay active (to travel).     . Weight (lb) < 124 lb (56.2 kg)     Would like to lose 10 pounds by decreasing about of salty snacks (nuts).     . Weight (lb) < 124 lb (56.2 kg)     Lose weight by decreasing snacks.        Fall Risk Fall Risk  10/14/2018 10/11/2017 04/13/2017 10/05/2016 10/04/2015  Falls in the past year? 0 No Yes No No  Number falls in past yr: - - 1 - -  Injury with Fall? - - No - -  Risk for fall due to : - - - -  Impaired vision    Depression Screen PHQ 2/9 Scores 10/14/2018 10/11/2017 10/05/2016 10/04/2015  PHQ - 2 Score 0 0 0 0     Cognitive Function MMSE - Mini Mental State Exam 10/14/2018 10/11/2017  Orientation to time 5 5  Orientation to Place 5 5  Registration 3 3  Attention/ Calculation 5 5  Recall 2 3  Language- name 2 objects 2 2  Language- repeat 1 1  Language- follow 3 step command 3 3  Language- read & follow direction 1 1  Write a sentence 1 1  Copy design 1 1  Total score 29 30        Immunization History  Administered Date(s) Administered  . Influenza, High Dose Seasonal PF 07/04/2018  . Influenza-Unspecified 06/23/2015, 06/21/2016, 06/07/2017  . Pneumococcal Conjugate-13 02/12/2014  . Pneumococcal Polysaccharide-23 01/06/2011  . Tdap 01/06/2003    Screening Tests Health Maintenance  Topic Date Due  . MAMMOGRAM  11/30/2017  . DEXA SCAN  11/29/2018  . COLONOSCOPY  08/10/2019  . TETANUS/TDAP  10/03/2020  . INFLUENZA VACCINE  Completed  . Hepatitis C Screening  Completed  . PNA vac Low Risk Adult  Completed        Plan:     Dr Marla Roe [phone].   Shingles vaccine at pharmacy.   Schedule mammogram and DEXA scan.  Continue doing brain stimulating activities (puzzles, reading, adult coloring books, staying active) to keep memory sharp.    I have personally reviewed and noted the following in the patient's chart:   . Medical and social history . Use of alcohol, tobacco or illicit drugs  . Current medications and supplements . Functional ability and status .  Nutritional status . Physical activity . Advanced directives . List of other physicians . Hospitalizations, surgeries, and ER visits in previous 12 months . Vitals . Screenings to include cognitive, depression, and falls . Referrals and appointments  In addition, I have reviewed and discussed with patient certain preventive protocols, quality metrics, and best practice recommendations. A  written personalized care plan for preventive services as well as general preventive health recommendations were provided to patient.     Gerilyn Nestle, RN  10/14/2018

## 2018-10-24 NOTE — Progress Notes (Signed)
AWV reviewed and agree. Signed:  Crissie Sickles, MD           10/24/2018

## 2018-12-22 ENCOUNTER — Encounter: Payer: Self-pay | Admitting: Family Medicine

## 2018-12-22 NOTE — Telephone Encounter (Signed)
Pt wrote the following My Chart message: Would there be restrictions or precautions, beyond usual, for myself and husband, to visit our recently born granddaughter, Sara Doyle [place].?We would travel by carand limit stops to refuel, gas.Sara Doyle and I are staying home, self quarantining, since returning from [place] yesterday.We intend to do this for 14 days before traveling to [facility]. The baby and her parents are in normal health, no issues. Thank you.   Please advise

## 2019-01-04 ENCOUNTER — Other Ambulatory Visit: Payer: Medicare Other

## 2019-01-04 ENCOUNTER — Ambulatory Visit: Payer: Medicare Other

## 2019-03-02 DIAGNOSIS — M1611 Unilateral primary osteoarthritis, right hip: Secondary | ICD-10-CM | POA: Diagnosis not present

## 2019-04-19 ENCOUNTER — Other Ambulatory Visit: Payer: Self-pay

## 2019-04-19 ENCOUNTER — Ambulatory Visit
Admission: RE | Admit: 2019-04-19 | Discharge: 2019-04-19 | Disposition: A | Discharge status: Home / Self Care | Payer: Medicare Other | Source: Ambulatory Visit | Attending: Family Medicine | Admitting: Family Medicine

## 2019-04-19 DIAGNOSIS — Z1231 Encounter for screening mammogram for malignant neoplasm of breast: Secondary | ICD-10-CM | POA: Diagnosis not present

## 2019-04-19 DIAGNOSIS — E2839 Other primary ovarian failure: Secondary | ICD-10-CM

## 2019-04-19 DIAGNOSIS — Z780 Asymptomatic menopausal state: Secondary | ICD-10-CM | POA: Diagnosis not present

## 2019-04-19 DIAGNOSIS — M85832 Other specified disorders of bone density and structure, left forearm: Secondary | ICD-10-CM | POA: Diagnosis not present

## 2019-06-18 DIAGNOSIS — Z23 Encounter for immunization: Secondary | ICD-10-CM | POA: Diagnosis not present

## 2019-07-19 ENCOUNTER — Encounter: Payer: Self-pay | Admitting: Gastroenterology

## 2019-07-24 DIAGNOSIS — Z20828 Contact with and (suspected) exposure to other viral communicable diseases: Secondary | ICD-10-CM | POA: Diagnosis not present

## 2019-07-25 ENCOUNTER — Other Ambulatory Visit: Payer: Self-pay

## 2019-07-25 ENCOUNTER — Ambulatory Visit (AMBULATORY_SURGERY_CENTER): Payer: Self-pay

## 2019-07-25 VITALS — Temp 96.2°F | Ht 62.0 in | Wt 134.0 lb

## 2019-07-25 DIAGNOSIS — Z1211 Encounter for screening for malignant neoplasm of colon: Secondary | ICD-10-CM

## 2019-07-25 NOTE — Progress Notes (Signed)
Denies allergies to eggs or soy products. Denies complication of anesthesia or sedation. Denies use of weight loss medication. Denies use of O2.   Emmi instructions given for colonoscopy.  Covid screening is scheduled for Monday 08/07/19 @ 2:00 Pm.

## 2019-08-07 ENCOUNTER — Other Ambulatory Visit: Payer: Self-pay | Admitting: Gastroenterology

## 2019-08-07 ENCOUNTER — Ambulatory Visit (INDEPENDENT_AMBULATORY_CARE_PROVIDER_SITE_OTHER): Payer: Medicare Other

## 2019-08-07 DIAGNOSIS — Z1159 Encounter for screening for other viral diseases: Secondary | ICD-10-CM

## 2019-08-08 LAB — SARS CORONAVIRUS 2 (TAT 6-24 HRS): SARS Coronavirus 2: NEGATIVE

## 2019-08-09 ENCOUNTER — Encounter: Payer: Self-pay | Admitting: Gastroenterology

## 2019-08-09 ENCOUNTER — Ambulatory Visit (AMBULATORY_SURGERY_CENTER): Payer: Medicare Other | Admitting: Gastroenterology

## 2019-08-09 ENCOUNTER — Other Ambulatory Visit: Payer: Self-pay

## 2019-08-09 VITALS — BP 104/61 | HR 74 | Temp 97.7°F | Resp 12 | Wt 134.0 lb

## 2019-08-09 DIAGNOSIS — Z1211 Encounter for screening for malignant neoplasm of colon: Secondary | ICD-10-CM

## 2019-08-09 DIAGNOSIS — Z86010 Personal history of colonic polyps: Secondary | ICD-10-CM | POA: Diagnosis not present

## 2019-08-09 MED ORDER — SODIUM CHLORIDE 0.9 % IV SOLN: 500.0000 mL | Freq: Once | INTRAVENOUS | Status: DC

## 2019-08-09 NOTE — Progress Notes (Signed)
Report given to PACU, vss 

## 2019-08-09 NOTE — Op Note (Signed)
[place] Patient Name: Sara Doyle Procedure Date: 08/09/2019 7:56 AM MRN: [id number] Endoscopist: Milus Banister , MD Age: 73 Referring MD:  Date of Birth: 08/02/1946 Gender: Female Account #: [id number] Procedure:                Colonoscopy Indications:              Screening for colorectal malignant neoplasm;                            Colonoscopy [place] 2006 single subCM adenoma;                            colonoscopy [place] 2010 no polyps Medicines:                Monitored Anesthesia Care Procedure:                Pre-Anesthesia Assessment:                           - Prior to the procedure, a History and Physical                            was performed, and patient medications and                            allergies were reviewed. The patient's tolerance of                            previous anesthesia was also reviewed. The risks                            and benefits of the procedure and the sedation                            options and risks were discussed with the patient.                            All questions were answered, and informed consent                            was obtained. Prior Anticoagulants: The patient has                            taken no previous anticoagulant or antiplatelet                            agents. ASA Grade Assessment: II - A patient with                            mild systemic disease. After reviewing the risks                            and benefits, the patient was deemed in  satisfactory condition to undergo the procedure.                           After obtaining informed consent, the colonoscope                            was passed under direct vision. Throughout the                            procedure, the patient's blood pressure, pulse, and                            oxygen saturations were monitored continuously. The                            Colonoscope was introduced  through the anus and                            advanced to the the cecum, identified by                            appendiceal orifice and ileocecal valve. The                            colonoscopy was performed without difficulty. The                            patient tolerated the procedure well. The quality                            of the bowel preparation was good. The ileocecal                            valve, appendiceal orifice, and rectum were                            photographed. Scope In: 8:04:08 AM Scope Out: 8:15:01 AM Scope Withdrawal Time: 0 hours 7 minutes 1 second  Total Procedure Duration: 0 hours 10 minutes 53 seconds  Findings:                 A few small-mouthed diverticula were found in the                            left colon.                           The exam was otherwise without abnormality on                            direct and retroflexion views. Complications:            No immediate complications. Estimated blood loss:                            None. Estimated Blood Loss:  Estimated blood loss: none. Impression:               - Diverticulosis in the left colon.                           - The examination was otherwise normal on direct                            and retroflexion views.                           - No polyps or cancers. Recommendation:           - Patient has a contact number available for                            emergencies. The signs and symptoms of potential                            delayed complications were discussed with the                            patient. Return to normal activities tomorrow.                            Written discharge instructions were provided to the                            patient.                           - Resume previous diet.                           - Continue present medications.                           You do not need any further colon cancer screening                             tests (including stool testing). These types of                            tests generally stop around age 59-80. Milus Banister, MD 08/09/2019 8:17:34 AM This report has been signed electronically.

## 2019-08-09 NOTE — Patient Instructions (Signed)
Handout on diverticulosis given   YOU HAD AN ENDOSCOPIC PROCEDURE TODAY AT [facility]:   Refer to the procedure report that was given to you for any specific questions about what was found during the examination.  If the procedure report does not answer your questions, please call your gastroenterologist to clarify.  If you requested that your care partner not be given the details of your procedure findings, then the procedure report has been included in a sealed envelope for you to review at your convenience later.  YOU SHOULD EXPECT: Some feelings of bloating in the abdomen. Passage of more gas than usual.  Walking can help get rid of the air that was put into your GI tract during the procedure and reduce the bloating. If you had a lower endoscopy (such as a colonoscopy or flexible sigmoidoscopy) you may notice spotting of blood in your stool or on the toilet paper. If you underwent a bowel prep for your procedure, you may not have a normal bowel movement for a few days.  Please Note:  You might notice some irritation and congestion in your nose or some drainage.  This is from the oxygen used during your procedure.  There is no need for concern and it should clear up in a day or so.  SYMPTOMS TO REPORT IMMEDIATELY:   Following lower endoscopy (colonoscopy or flexible sigmoidoscopy):  Excessive amounts of blood in the stool  Significant tenderness or worsening of abdominal pains  Swelling of the abdomen that is new, acute  Fever of 100F or higher   For urgent or emergent issues, a gastroenterologist can be reached at any hour by calling [phone].   DIET:  We do recommend a small meal at first, but then you may proceed to your regular diet.  Drink plenty of fluids but you should avoid alcoholic beverages for 24 hours.  ACTIVITY:  You should plan to take it easy for the rest of today and you should NOT DRIVE or use heavy machinery until tomorrow (because of the  sedation medicines used during the test).    FOLLOW UP: Our staff will call the number listed on your records 48-72 hours following your procedure to check on you and address any questions or concerns that you may have regarding the information given to you following your procedure. If we do not reach you, we will leave a message.  We will attempt to reach you two times.  During this call, we will ask if you have developed any symptoms of COVID 19. If you develop any symptoms (ie: fever, flu-like symptoms, shortness of breath, cough etc.) before then, please call [phone].  If you test positive for Covid 19 in the 2 weeks post procedure, please call and report this information to [place].    If any biopsies were taken you will be contacted by phone or by letter within the next 1-3 weeks.  Please call us at [phone] if you have not heard about the biopsies in 3 weeks.    SIGNATURES/CONFIDENTIALITY: You and/or your care partner have signed paperwork which will be entered into your electronic medical record.  These signatures attest to the fact that that the information above on your After Visit Summary has been reviewed and is understood.  Full responsibility of the confidentiality of this discharge information lies with you and/or your care-partner.

## 2019-08-11 ENCOUNTER — Telehealth: Payer: Self-pay

## 2019-08-11 NOTE — Telephone Encounter (Signed)
  Follow up Call-  Call back number 08/09/2019  Post procedure Call Back phone  # [phone]  Permission to leave phone message Yes  Some recent data might be hidden     Patient questions:  Do you have a fever, pain , or abdominal swelling? No. Pain Score  0 *  Have you tolerated food without any problems? Yes.    Have you been able to return to your normal activities? Yes.    Do you have any questions about your discharge instructions: Diet   No. Medications  No. Follow up visit  No.  Do you have questions or concerns about your Care? No.  Actions: * If pain score is 4 or above: No action needed, pain <4. 1. Have you developed a fever since your procedure? no  2.   Have you had an respiratory symptoms (SOB or cough) since your procedure? no  3.   Have you tested positive for COVID 19 since your procedure no  4.   Have you had any family members/close contacts diagnosed with the COVID 19 since your procedure?  no   If yes to any of these questions please route to Joylene John, RN and Alphonsa Gin, Therapist, sports.

## 2019-09-12 DIAGNOSIS — H2513 Age-related nuclear cataract, bilateral: Secondary | ICD-10-CM | POA: Diagnosis not present

## 2019-09-12 DIAGNOSIS — H4321 Crystalline deposits in vitreous body, right eye: Secondary | ICD-10-CM | POA: Diagnosis not present

## 2019-09-27 ENCOUNTER — Ambulatory Visit: Payer: Medicare Other | Attending: Internal Medicine

## 2019-09-27 DIAGNOSIS — Z23 Encounter for immunization: Secondary | ICD-10-CM | POA: Diagnosis not present

## 2019-09-27 NOTE — Progress Notes (Signed)
   Covid-19 Vaccination Clinic  Name:  Sara Doyle    MRN: [id number] DOB: Nov 10, 1945  09/27/2019  [name] was observed post Covid-19 immunization for 15 minutes without incidence. She was provided with Vaccine Information Sheet and instruction to access the V-Safe system.   [name] was instructed to call 911 with any severe reactions post vaccine: Marland Kitchen Difficulty breathing  . Swelling of your face and throat  . A fast heartbeat  . A bad rash all over your body  . Dizziness and weakness    Immunizations Administered    Name Date Dose VIS Date Route   Pfizer COVID-19 Vaccine 09/27/2019 11:03 AM 0.3 mL 08/18/2019 Intramuscular   Manufacturer: [place]   Lot: [id number]   [place]: [id number]

## 2019-10-16 ENCOUNTER — Ambulatory Visit: Payer: Medicare Other | Attending: Internal Medicine

## 2019-10-16 DIAGNOSIS — Z23 Encounter for immunization: Secondary | ICD-10-CM | POA: Insufficient documentation

## 2019-10-16 NOTE — Progress Notes (Signed)
   Covid-19 Vaccination Clinic  Name:  Sara Doyle    MRN: [id number] DOB: [birth year]  10/16/2019  [name] was observed post Covid-19 immunization for 15 minutes without incidence. She was provided with Vaccine Information Sheet and instruction to access the V-Safe system.   [name] was instructed to call 911 with any severe reactions post vaccine: Marland Kitchen Difficulty breathing  . Swelling of your face and throat  . A fast heartbeat  . A bad rash all over your body  . Dizziness and weakness    Immunizations Administered    Name Date Dose VIS Date Route   Pfizer COVID-19 Vaccine 10/16/2019  5:29 PM 0.3 mL 08/18/2019 Intramuscular   Manufacturer: [place]   Lot: [id number]   [place]: [id number]

## 2019-10-20 ENCOUNTER — Encounter: Payer: Self-pay | Admitting: Family Medicine

## 2019-10-20 ENCOUNTER — Other Ambulatory Visit: Payer: Self-pay

## 2019-10-20 ENCOUNTER — Ambulatory Visit (INDEPENDENT_AMBULATORY_CARE_PROVIDER_SITE_OTHER): Payer: Medicare Other | Admitting: Family Medicine

## 2019-10-20 VITALS — BP 102/64 | HR 57 | Temp 97.9°F | Resp 17 | Ht 61.0 in | Wt 132.1 lb

## 2019-10-20 DIAGNOSIS — M8580 Other specified disorders of bone density and structure, unspecified site: Secondary | ICD-10-CM | POA: Diagnosis not present

## 2019-10-20 DIAGNOSIS — R7309 Other abnormal glucose: Secondary | ICD-10-CM

## 2019-10-20 DIAGNOSIS — E7800 Pure hypercholesterolemia, unspecified: Secondary | ICD-10-CM | POA: Diagnosis not present

## 2019-10-20 DIAGNOSIS — Z0000 Encounter for general adult medical examination without abnormal findings: Secondary | ICD-10-CM | POA: Diagnosis not present

## 2019-10-20 LAB — CBC
HCT: 41.2 % (ref 36.0–46.0)
Hemoglobin: 14 g/dL (ref 12.0–15.0)
MCHC: 33.9 g/dL (ref 30.0–36.0)
MCV: 101.9 fl — ABNORMAL HIGH (ref 78.0–100.0)
Platelets: 259 10*3/uL (ref 150.0–400.0)
RBC: 4.04 Mil/uL (ref 3.87–5.11)
RDW: 13.5 % (ref 11.5–15.5)
WBC: 5.2 10*3/uL (ref 4.0–10.5)

## 2019-10-20 LAB — LIPID PANEL
Cholesterol: 224 mg/dL — ABNORMAL HIGH (ref 0–200)
HDL: 88.4 mg/dL (ref >39.00)
LDL Cholesterol: 121 mg/dL — ABNORMAL HIGH (ref 0–99)
NonHDL: 135.27
Total CHOL/HDL Ratio: 3
Triglycerides: 69 mg/dL (ref 0.0–149.0)
VLDL: 13.8 mg/dL (ref 0.0–40.0)

## 2019-10-20 LAB — COMPREHENSIVE METABOLIC PANEL
ALT: 10 U/L (ref 0–35)
AST: 15 U/L (ref 0–37)
Albumin: 4.4 g/dL (ref 3.5–5.2)
Alkaline Phosphatase: 65 U/L (ref 39–117)
BUN: 12 mg/dL (ref 6–23)
CO2: 30 mEq/L (ref 19–32)
Calcium: 9.5 mg/dL (ref 8.4–10.5)
Chloride: 96 mEq/L (ref 96–112)
Creatinine, Ser: 0.59 mg/dL (ref 0.40–1.20)
GFR: 99.6 mL/min (ref >60.00)
Glucose, Bld: 91 mg/dL (ref 70–99)
Potassium: 4.9 mEq/L (ref 3.5–5.1)
Sodium: 133 mEq/L — ABNORMAL LOW (ref 135–145)
Total Bilirubin: 0.4 mg/dL (ref 0.2–1.2)
Total Protein: 7.2 g/dL (ref 6.0–8.3)

## 2019-10-20 LAB — HEMOGLOBIN A1C: Hgb A1c MFr Bld: 5.4 % (ref 4.6–6.5)

## 2019-10-20 MED ORDER — ZOSTER VAC RECOMB ADJUVANTED 50 MCG/0.5ML IM SUSR: 0.5000 mL | Freq: Once | INTRAMUSCULAR | 1 refills | Status: AC

## 2019-10-20 MED ORDER — ZOSTER VAC RECOMB ADJUVANTED 50 MCG/0.5ML IM SUSR: 0.5000 mL | Freq: Once | INTRAMUSCULAR | 1 refills | Status: DC

## 2019-10-20 NOTE — Progress Notes (Signed)
Medicare AWV, subsequent.   Chief Complaint  Patient presents with  . Medicare Wellness    Pt is asking about the shingles vaccine.     Patient Care Team    Relationship Specialty Notifications Start End  Ma Hillock, DO PCP - General Family Medicine  05/10/15   Milus Banister, MD Attending Physician Gastroenterology  10/05/16   Rutherford Guys, MD Consulting Physician Ophthalmology  10/05/16   Sallyanne Kuster  Dentistry  10/05/16   Dickie La, MD Consulting Physician Family Medicine  10/11/17    Comment: (ortho)     History of Present Ilness: Sara Doyle, 74 y.o. , female presents today for Medicare wellness visit.   Past medical, surgical, family and social histories reviewed (including experiences with illnesses, hospital stays, operations, injuries, and treatments):  Past Medical History:  Diagnosis Date  . Allergy    All allergies reviewed Allergies  Allergen Reactions  . 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 Demerol [Meperidine] Nausea And Vomiting   Past Surgical History:  Procedure Laterality Date  . BUNIONECTOMY Right   . FRACTURE SURGERY  1969   right leg  . FRACTURE SURGERY  2005   right leg  . SKIN LESION EXCISION Right 07/08/2017   Behind right knee, benign  . 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 TUBAL LIGATION     Family History  Problem Relation Age of Onset  . Hypertension Mother   . Arthritis Father        osteo  . Heart disease Brother        congenital  . Breast cancer Sister 60       breast  . Arthritis Sister   . Hypertension Sister   . Heart disease Brother   . Cancer Brother        prostate, colon  . Arrhythmia Brother        a-fib  . Heart disease Brother    . Colon cancer Neg Hx   . Esophageal cancer Neg Hx   . Rectal cancer Neg Hx   . Stomach cancer Neg Hx    Social History   Social History Narrative   Ms. Raczkowski lives with her husband, She relocated from [place] Nov., 2015. She is retired, has 3 grown sons that live in [place].   Smoke alarm in the home, wears her seatbelt, uses sunscreen.   Ambulates independently. No dentures.   Has dentist with regular appts.    Feels safe in her relationships    All medications verified Allergies as of 10/20/2019      Reactions   Oysters [shellfish Allergy] Anaphylaxis, Swelling   Tongue swelling/difficulty breathing   Demerol [meperidine] Nausea And Vomiting      Medication List       Accurate as of October 20, 2019 10:43 AM. If you have any questions, ask your nurse or doctor.        STOP taking these medications   amoxicillin 500 MG capsule Commonly known as: AMOXIL Stopped by: Howard Pouch, DO   Calcium Carb-Ergocalciferol 250-125 MG-UNIT Tabs Stopped by: Howard Pouch, DO   ibuprofen 200 MG tablet Commonly known as: ADVIL Stopped by: Howard Pouch, DO     TAKE  these medications   Biotin 5000 MCG Caps Take by mouth.   Melatonin 5 MG Caps Take by mouth as needed.   OVER THE COUNTER MEDICATION Areds preservision one tablet daily.   Oyster Shell Calcium/D 250-125 MG-UNIT Tabs Take by mouth.   Zoster Vaccine Adjuvanted injection Commonly known as: SHINGRIX Inject 0.5 mLs into the muscle once for 1 dose. Rpt dose in 2-6 mos Started by: Howard Pouch, DO      Health maintenance:  Colonoscopy: no longer needs screening.  Mammogram (50-74): completed 04/2019 [facility]- pt desires every 2 yr testing.  Immunizations: tdap due 2022. Flu UTD 2020. Covid vaccines completed. PNA series completed. Shingrix script printed today (told to wait at least 4 weeks after covid vaccine) Infectious disease screening: completed Hep C Glaucoma screen: 08/2019 by Dr. Gershon Crane  No exam data present Hearing: Whisper test, no barriers identified.  Depression screen [facility] 2/9 10/20/2019 10/14/2018 10/11/2017 10/05/2016 10/04/2015  Decreased Interest 0 0 0 0 0  Down, Depressed, Hopeless 0 0 0 0 0  PHQ - 2 Score 0 0 0 0 0   Cognitive assessment:  Word recall (daisy, blue, church): 2.5/3 (had to think about church- but achieved memory with cue)  Immunizations: Immunization History  Administered Date(s) Administered  . Influenza, High Dose Seasonal PF 07/04/2018, 06/17/2019  . Influenza-Unspecified 06/23/2015, 06/21/2016, 06/07/2017  . PFIZER SARS-COV-2 Vaccination 09/27/2019, 10/16/2019  . Pneumococcal Conjugate-13 02/12/2014  . Pneumococcal Polysaccharide-23 01/06/2011  . Tdap 01/06/2003    Exercise: Current Exercise Habits: The patient does not participate in regular exercise at present   Diet: Regular Functional Status Survey: Get up and go test: steady and less than 20 seconds.  Is the patient deaf or have difficulty hearing?: No Does the patient have difficulty seeing, even when wearing glasses/contacts?: No Does the patient have difficulty concentrating, remembering, or making decisions?: No Does the patient have difficulty walking or climbing stairs?: No Does the patient have difficulty dressing or bathing?: No Does the patient have difficulty doing errands alone such as visiting a doctor's office or shopping?: No Current Exercise Habits: The patient does not participate in regular exercise at present   Fall Risk  10/20/2019 10/14/2018 10/11/2017 04/13/2017 10/05/2016  Falls in the past year? 1 0 No Yes No  Number falls in past yr: 1 - - 1 -  Injury with Fall? 0 - - No -  Risk for fall due to : History of fall(s) - - - -  Follow up Falls evaluation completed;Education provided;Falls prevention discussed - - - -    Advanced Care Planning: Y- she will bring a copy  Cardiovascular Screening Blood Tests Lipid Panel     Component Value Date/Time   CHOL 212 (H)  10/12/2016 1123   TRIG 55.0 10/12/2016 1123   HDL 101.20 10/12/2016 1123   CHOLHDL 2 10/12/2016 1123   VLDL 11.0 10/12/2016 1123   LDLCALC 100 (H) 10/12/2016 1123    Diabetes Screening Tests Lab Results  Component Value Date   HGBA1C 5.4 10/03/2014  BP 102/64 (BP Location: Left Arm, Patient Position: Sitting, Cuff Size: Normal)   Pulse (!) 57   Temp 97.9 F (36.6 C) (Temporal)   Resp 17   Ht 5' 1" (1.549 m)   Wt 132 lb 2 oz (59.9 kg)   SpO2 97%   BMI 24.96 kg/m  Gen: Afebrile. No acute distress. Pleasant caucasian female HENT: AT. [place].  Eyes:Pupils Equal Round Reactive to light, Extraocular movements intact,  Conjunctiva  without redness, discharge or icterus. CV: RRR no murmur, no edema, +2/4 P posterior tibialis pulses Chest: CTAB, no wheeze or crackles  Neuro: Normal gait. PERLA. EOMi. Alert. Oriented x3 Psych: Normal affect, dress and demeanor. Normal speech. Normal thought content and judgment.  Assessment and Plan: Elevated cholesterol - HDL is  Great- cholesterol panel has been good overall.  - CBC - Lipid panel  Osteopenia, unspecified location Discussed weight bearing exercises, ca/vitd Rpt 2023  Medicare annual wellness visit, subsequent Patient was encouraged to exercise greater than 150 minutes a week. Patient was encouraged to choose a diet filled with fresh fruits and vegetables, and lean meats. AVS provided to patient today for education/recommendation on gender specific health and safety maintenance. Colonoscopy: no longer needs screening.  Mammogram (50-74): completed 04/2019 [facility]- pt desires every 2 yr testing.  Immunizations: tdap due 2022. Flu UTD 2020. Covid vaccines completed. PNA series completed. Shingrix script printed today (told to wait at least 4 weeks after covid vaccine) Infectious disease screening: completed Hep C Glaucoma screen: 08/2019 by Dr. Gershon Crane  Elevated glucose - CBC - Comp Met (CMET) - Hemoglobin A1c   Orders Placed This Encounter  Procedures  . CBC  . Comp Met (CMET)  . Lipid panel  . Hemoglobin A1c   Meds ordered this encounter  Medications  . DISCONTD: Zoster Vaccine Adjuvanted [facility]) injection    Sig: Inject 0.5 mLs into the muscle once for 1 dose. Rpt dose in 2-6 mos    Dispense:  0.5 mL    Refill:  1  . Zoster Vaccine Adjuvanted [facility]) injection    Sig: Inject 0.5 mLs into the muscle once for 1 dose. Rpt dose in 2-6 mos    Dispense:  0.5 mL    Refill:  1   Referral Orders  No referral(s) requested today     Electronically Signed by: Howard Pouch, DO [place]

## 2019-10-20 NOTE — Patient Instructions (Addendum)
[name] , Thank you for taking time to come for your Medicare Wellness Visit. I appreciate your ongoing commitment to your health goals. Please review the following plan we discussed and let me know if I can assist you in the future.   These are the goals we discussed:  This is a list of the screening recommended for you and due dates:  Health Maintenance  Topic Date Due  . Tetanus Vaccine  10/03/2020  . Mammogram  04/18/2021  . DEXA scan (bone density measurement)  04/18/2022  . Flu Shot  Completed  .  Hepatitis C: One time screening is recommended by Center for Disease Control  (CDC) for  adults born from [birth year] through 1966.   Completed  . Pneumonia vaccines  Completed  . Colon Cancer Screening  Discontinued    Health Maintenance After Age 78 After age 52, you are at a higher risk for certain long-term diseases and infections as well as injuries from falls. Falls are a major cause of broken bones and head injuries in people who are older than age 86. Getting regular preventive care can help to keep you healthy and well. Preventive care includes getting regular testing and making lifestyle changes as recommended by your health care provider. Talk with your health care provider about:  Which screenings and tests you should have. A screening is a test that checks for a disease when you have no symptoms.  A diet and exercise plan that is right for you. What should I know about screenings and tests to prevent falls? Screening and testing are the best ways to find a health problem early. Early diagnosis and treatment give you the best chance of managing medical conditions that are common after age 53. Certain conditions and lifestyle choices may make you more likely to have a fall. Your health care provider may recommend:  Regular vision checks. Poor vision and conditions such as cataracts can make you more likely to have a fall. If you wear glasses, make sure to get your prescription  updated if your vision changes.  Medicine review. Work with your health care provider to regularly review all of the medicines you are taking, including over-the-counter medicines. Ask your health care provider about any side effects that may make you more likely to have a fall. Tell your health care provider if any medicines that you take make you feel dizzy or sleepy.  Osteoporosis screening. Osteoporosis is a condition that causes the bones to get weaker. This can make the bones weak and cause them to break more easily.  Blood pressure screening. Blood pressure changes and medicines to control blood pressure can make you feel dizzy.  Strength and balance checks. Your health care provider may recommend certain tests to check your strength and balance while standing, walking, or changing positions.  Foot health exam. Foot pain and numbness, as well as not wearing proper footwear, can make you more likely to have a fall.  Depression screening. You may be more likely to have a fall if you have a fear of falling, feel emotionally low, or feel unable to do activities that you used to do.  Alcohol use screening. Using too much alcohol can affect your balance and may make you more likely to have a fall. What actions can I take to lower my risk of falls? General instructions  Talk with your health care provider about your risks for falling. Tell your health care provider if: ? You fall. Be sure to  tell your health care provider about all falls, even ones that seem minor. ? You feel dizzy, sleepy, or off-balance.  Take over-the-counter and prescription medicines only as told by your health care provider. These include any supplements.  Eat a healthy diet and maintain a healthy weight. A healthy diet includes low-fat dairy products, low-fat (lean) meats, and fiber from whole grains, beans, and lots of fruits and vegetables. Home safety  Remove any tripping hazards, such as rugs, cords, and  clutter.  Install safety equipment such as grab bars in bathrooms and safety rails on stairs.  Keep rooms and walkways well-lit. Activity   Follow a regular exercise program to stay fit. This will help you maintain your balance. Ask your health care provider what types of exercise are appropriate for you.  If you need a cane or walker, use it as recommended by your health care provider.  Wear supportive shoes that have nonskid soles. Lifestyle  Do not drink alcohol if your health care provider tells you not to drink.  If you drink alcohol, limit how much you have: ? 0-1 drink a day for women. ? 0-2 drinks a day for men.  Be aware of how much alcohol is in your drink. In the [place] one drink equals one typical bottle of beer (12 oz), one-half glass of wine (5 oz), or one shot of hard liquor (1 oz).  Do not use any products that contain nicotine or tobacco, such as cigarettes and e-cigarettes. If you need help quitting, ask your health care provider. Summary  Having a healthy lifestyle and getting preventive care can help to protect your health and wellness after age 31.  Screening and testing are the best way to find a health problem early and help you avoid having a fall. Early diagnosis and treatment give you the best chance for managing medical conditions that are more common for people who are older than age 62.  Falls are a major cause of broken bones and head injuries in people who are older than age 35. Take precautions to prevent a fall at home.  Work with your health care provider to learn what changes you can make to improve your health and wellness and to prevent falls. This information is not intended to replace advice given to you by your health care provider. Make sure you discuss any questions you have with your health care provider. Document Revised: 12/15/2018 Document Reviewed: 07/07/2017 Elsevier Patient Education  2020 [employer].

## 2019-10-23 ENCOUNTER — Telehealth: Payer: Self-pay | Admitting: Family Medicine

## 2019-10-23 DIAGNOSIS — D7589 Other specified diseases of blood and blood-forming organs: Secondary | ICD-10-CM

## 2019-10-23 NOTE — Telephone Encounter (Signed)
Pt was called and message was left to return call  

## 2019-10-23 NOTE — Telephone Encounter (Signed)
Please call patient: - Her liver and kidney function are normal.   -Diabetes screening is negative, A1c is 5.4 which is in normal range. - Electrolyte counts are stable from prior collections. -Cholesterol panel is stable from prior collections with a total cholesterol 224, HDL/good cholesterol of 88 and an LDL of 121. -Her blood cell counts are in normal range.  She does have mildly enlarged red blood cells.  This has occurred in her labs off and on for many years as far as I can see in the EMR.    -There are many causes of macrocytosis (enlarged red blood cells)-most common are vitamin B deficiencies, thyroid disorder or even mild alcohol consumption in women.  Would encourage her to start a B complex OTC vitamin.      -If she is desiring further evaluation, please schedule her appointment to review in detail and discuss potential further lab evaluation versus hematology referral.

## 2019-10-23 NOTE — Telephone Encounter (Signed)
Pt was called and given information. She would like to try the OTC B complex and does not want to look further into it right now. She will take Vit B complex and next time labs are drawn see if it makes a difference

## 2019-11-15 ENCOUNTER — Encounter: Payer: Self-pay | Admitting: Family Medicine

## 2019-11-15 ENCOUNTER — Other Ambulatory Visit: Payer: Self-pay

## 2019-11-15 ENCOUNTER — Ambulatory Visit (INDEPENDENT_AMBULATORY_CARE_PROVIDER_SITE_OTHER): Payer: Medicare Other | Admitting: Family Medicine

## 2019-11-15 VITALS — BP 115/76 | HR 79 | Temp 97.3°F | Resp 16 | Ht 61.0 in | Wt 132.5 lb

## 2019-11-15 DIAGNOSIS — R300 Dysuria: Secondary | ICD-10-CM

## 2019-11-15 DIAGNOSIS — R8290 Unspecified abnormal findings in urine: Secondary | ICD-10-CM | POA: Diagnosis not present

## 2019-11-15 LAB — POCT URINALYSIS DIPSTICK
Bilirubin, UA: NEGATIVE
Blood, UA: 10
Glucose, UA: NEGATIVE
Ketones, UA: NEGATIVE
Nitrite, UA: NEGATIVE
Protein, UA: NEGATIVE
Spec Grav, UA: 1.01 (ref 1.010–1.025)
Urobilinogen, UA: 0.2 E.U./dL
pH, UA: 6 (ref 5.0–8.0)

## 2019-11-15 MED ORDER — CEPHALEXIN 500 MG PO CAPS: 500.0000 mg | ORAL_CAPSULE | Freq: Three times a day (TID) | ORAL | 0 refills | Status: DC

## 2019-11-15 NOTE — Progress Notes (Signed)
This visit occurred during the SARS-CoV-2 public health emergency.  Safety protocols were in place, including screening questions prior to the visit, additional usage of staff PPE, and extensive cleaning of exam room while observing appropriate contact time as indicated for disinfecting solutions.    Sara Doyle , 12/06/1945, 74 y.o., female MRN: [id number] Patient Care Team    Relationship Specialty Notifications Start End  Ma Hillock, DO PCP - General Family Medicine  05/10/15   Milus Banister, MD Attending Physician Gastroenterology  10/05/16   Rutherford Guys, MD Consulting Physician Ophthalmology  10/05/16   Sallyanne Kuster  Dentistry  10/05/16   Dickie La, MD Consulting Physician Family Medicine  10/11/17    Comment: (ortho)    Chief Complaint  Patient presents with  . Urinary Tract Infection    Pt is having burning with urination, frequency, and urgency  x4 days.     Subjective: Pt presents for an OV with complaints of burning, frequency and urgency of urination of 4-5 days duration.  Patient denies fever, chills, nausea, vomit. She has not had a uti since she was 29- but recalls her current symptoms are consistent with that time.   Depression screen [facility] 2/9 10/20/2019 10/14/2018 10/11/2017 10/05/2016 10/04/2015  Decreased Interest 0 0 0 0 0  Down, Depressed, Hopeless 0 0 0 0 0  PHQ - 2 Score 0 0 0 0 0    Allergies  Allergen Reactions  . 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 Demerol [Meperidine] Nausea And Vomiting   Social History   Social History Narrative   Ms. Bebber lives with her husband, She relocated from [place] Nov., 2015. She is retired, has 3 grown sons that live in [place].   Smoke alarm in the home, wears her seatbelt, uses sunscreen.   Ambulates independently. No dentures.   Has dentist with regular appts.    Feels safe in her relationships   Past Medical History:  Diagnosis Date  .  Allergy    Past Surgical History:  Procedure Laterality Date  . BUNIONECTOMY Right   . FRACTURE SURGERY  1969   right leg  . FRACTURE SURGERY  2005   right leg  . SKIN LESION EXCISION Right 07/08/2017   Behind right knee, benign  . 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 TUBAL LIGATION     Family History  Problem Relation Age of Onset  . Hypertension Mother   . Arthritis Father        osteo  . Heart disease Brother        congenital  . Breast cancer Sister 66       breast  . Arthritis Sister   . Hypertension Sister   . Heart disease Brother   . Cancer Brother        prostate, colon  . Arrhythmia Brother        a-fib  . Heart disease Brother   . Colon cancer Neg Hx   . Esophageal cancer Neg Hx   . Rectal cancer Neg Hx   . Stomach cancer Neg Hx    Allergies as of 11/15/2019      Reactions   Oysters [shellfish Allergy] Anaphylaxis, Swelling  Tongue swelling/difficulty breathing   Demerol [meperidine] Nausea And Vomiting      Medication List       Accurate as of November 15, 2019  1:50 PM. If you have any questions, ask your nurse or doctor.        STOP taking these medications   Biotin 5000 MCG Caps Stopped by: Howard Pouch, DO     TAKE these medications   b complex vitamins tablet Take 1 tablet by mouth daily.   cephALEXin 500 MG capsule Commonly known as: KEFLEX Take 1 capsule (500 mg total) by mouth 3 (three) times daily. Started by: Howard Pouch, DO   Melatonin 5 MG Caps Take by mouth as needed.   OVER THE COUNTER MEDICATION Areds preservision one tablet daily.   Oyster Shell Calcium/D 250-125 MG-UNIT Tabs Take by mouth.       All past medical history, surgical history, allergies, family history, immunizations andmedications were  updated in the EMR today and reviewed under the history and medication portions of their EMR.     ROS: Negative, with the exception of above mentioned in HPI   Objective:  BP 115/76 (BP Location: Right Arm, Patient Position: Sitting, Cuff Size: Normal)   Pulse 79   Temp (!) 97.3 F (36.3 C) (Temporal)   Resp 16   Ht 5\' 1"  (1.549 m)   Wt 132 lb 8 oz (60.1 kg)   SpO2 98%   BMI 25.04 kg/m  Body mass index is 25.04 kg/m. Gen: Afebrile. No acute distress. Nontoxic in appearance, well developed, well nourished.  HENT: AT. [place]. CV: RRR  Chest: CTAB, no wheeze or crackles.  Abd: Soft. flat. NTND. BS presnt. no Masses palpated. No rebound or guarding.  MSK: No CVA tenderness bilateral Skin: no rashes, purpura or petechiae.  Neuro:  Normal gait. PERLA. EOMi. Alert. Oriented x3   No exam data present No results found. Results for orders placed or performed in visit on 11/15/19 (from the past 24 hour(s))  POCT urinalysis dipstick     Status: Abnormal   Collection Time: 11/15/19  1:18 PM  Result Value Ref Range   Color, UA yellow    Clarity, UA clear    Glucose, UA Negative Negative   Bilirubin, UA negative    Ketones, UA negative    Spec Grav, UA 1.010 1.010 - 1.025   Blood, UA 10    pH, UA 6.0 5.0 - 8.0   Protein, UA Negative Negative   Urobilinogen, UA 0.2 0.2 or 1.0 E.U./dL   Nitrite, UA negative    Leukocytes, UA Small (1+) (A) Negative   Appearance     Odor      Assessment/Plan: Sara Doyle is a 74 y.o. female present for OV for  Burning with urination/Abnormal urine Rest, hydrate.  POCT urine with small amount of leuks.  Treat with keflex  - Urine Culture sent to be complete. - pt will be called with results and instructions on further management dependent on her urcx.    Reviewed expectations re: course of current medical issues.  Discussed self-management of symptoms.  Outlined signs and symptoms indicating need for more acute intervention.  Patient  verbalized understanding and all questions were answered.  Patient received an After-Visit Summary.    Orders Placed This Encounter  Procedures  . Urine Culture  . POCT urinalysis dipstick   Meds ordered this encounter  Medications  . cephALEXin (KEFLEX) 500 MG capsule    Sig: Take 1 capsule (500  mg total) by mouth 3 (three) times daily.    Dispense:  21 capsule    Refill:  0   Referral Orders  No referral(s) requested today     Note is dictated utilizing voice recognition software. Although note has been proof read prior to signing, occasional typographical errors still can be missed. If any questions arise, please do not hesitate to call for verification.   electronically signed by:  Howard Pouch, DO  [place]

## 2019-11-15 NOTE — Patient Instructions (Signed)

## 2019-11-16 LAB — URINE CULTURE
MICRO NUMBER:: 10236480
SPECIMEN QUALITY:: ADEQUATE

## 2019-12-21 ENCOUNTER — Telehealth: Payer: Self-pay

## 2019-12-21 DIAGNOSIS — Z23 Encounter for immunization: Secondary | ICD-10-CM

## 2019-12-21 MED ORDER — ZOSTER VAC RECOMB ADJUVANTED 50 MCG/0.5ML IM SUSR: 0.5000 mL | Freq: Once | INTRAMUSCULAR | 1 refills | Status: AC

## 2019-12-21 NOTE — Telephone Encounter (Signed)
I did go ahead and send this in for her.  However, she was printed a prescription for her Shingrix at her Medicare wellness appointment in February 2021.

## 2019-12-21 NOTE — Telephone Encounter (Signed)
Pts husband was called and VM was left letting them know hard copy was given to them at appt and one was sent to pharmacy

## 2019-12-21 NOTE — Telephone Encounter (Signed)
Patient's husband, Patrick Jupiter calling to get shingrix script sent to pharmacy for patient.   Currently pending, please advise.

## 2020-01-09 DIAGNOSIS — M25561 Pain in right knee: Secondary | ICD-10-CM | POA: Diagnosis not present

## 2020-01-09 DIAGNOSIS — M1711 Unilateral primary osteoarthritis, right knee: Secondary | ICD-10-CM | POA: Diagnosis not present

## 2020-02-15 DIAGNOSIS — M25551 Pain in right hip: Secondary | ICD-10-CM | POA: Diagnosis not present

## 2020-02-15 DIAGNOSIS — M25561 Pain in right knee: Secondary | ICD-10-CM | POA: Diagnosis not present

## 2020-04-16 DIAGNOSIS — M25561 Pain in right knee: Secondary | ICD-10-CM | POA: Diagnosis not present

## 2020-04-16 DIAGNOSIS — M1711 Unilateral primary osteoarthritis, right knee: Secondary | ICD-10-CM | POA: Diagnosis not present

## 2020-05-06 DIAGNOSIS — M25561 Pain in right knee: Secondary | ICD-10-CM | POA: Diagnosis not present

## 2020-05-06 DIAGNOSIS — M1711 Unilateral primary osteoarthritis, right knee: Secondary | ICD-10-CM | POA: Diagnosis not present

## 2020-05-14 DIAGNOSIS — M25561 Pain in right knee: Secondary | ICD-10-CM | POA: Diagnosis not present

## 2020-05-14 DIAGNOSIS — M1711 Unilateral primary osteoarthritis, right knee: Secondary | ICD-10-CM | POA: Diagnosis not present

## 2020-05-21 DIAGNOSIS — M1711 Unilateral primary osteoarthritis, right knee: Secondary | ICD-10-CM | POA: Diagnosis not present

## 2020-05-21 DIAGNOSIS — M25561 Pain in right knee: Secondary | ICD-10-CM | POA: Diagnosis not present

## 2020-06-25 DIAGNOSIS — M25561 Pain in right knee: Secondary | ICD-10-CM | POA: Diagnosis not present

## 2020-06-25 DIAGNOSIS — M1711 Unilateral primary osteoarthritis, right knee: Secondary | ICD-10-CM | POA: Diagnosis not present

## 2020-07-16 IMAGING — DX DG CHEST 2V
2 series · 2 of 2 positions shown · non-contrast
Comparison: None.

CLINICAL DATA: Pre surgery evaluation.

EXAM:
CHEST - 2 VIEW

[chest pa]
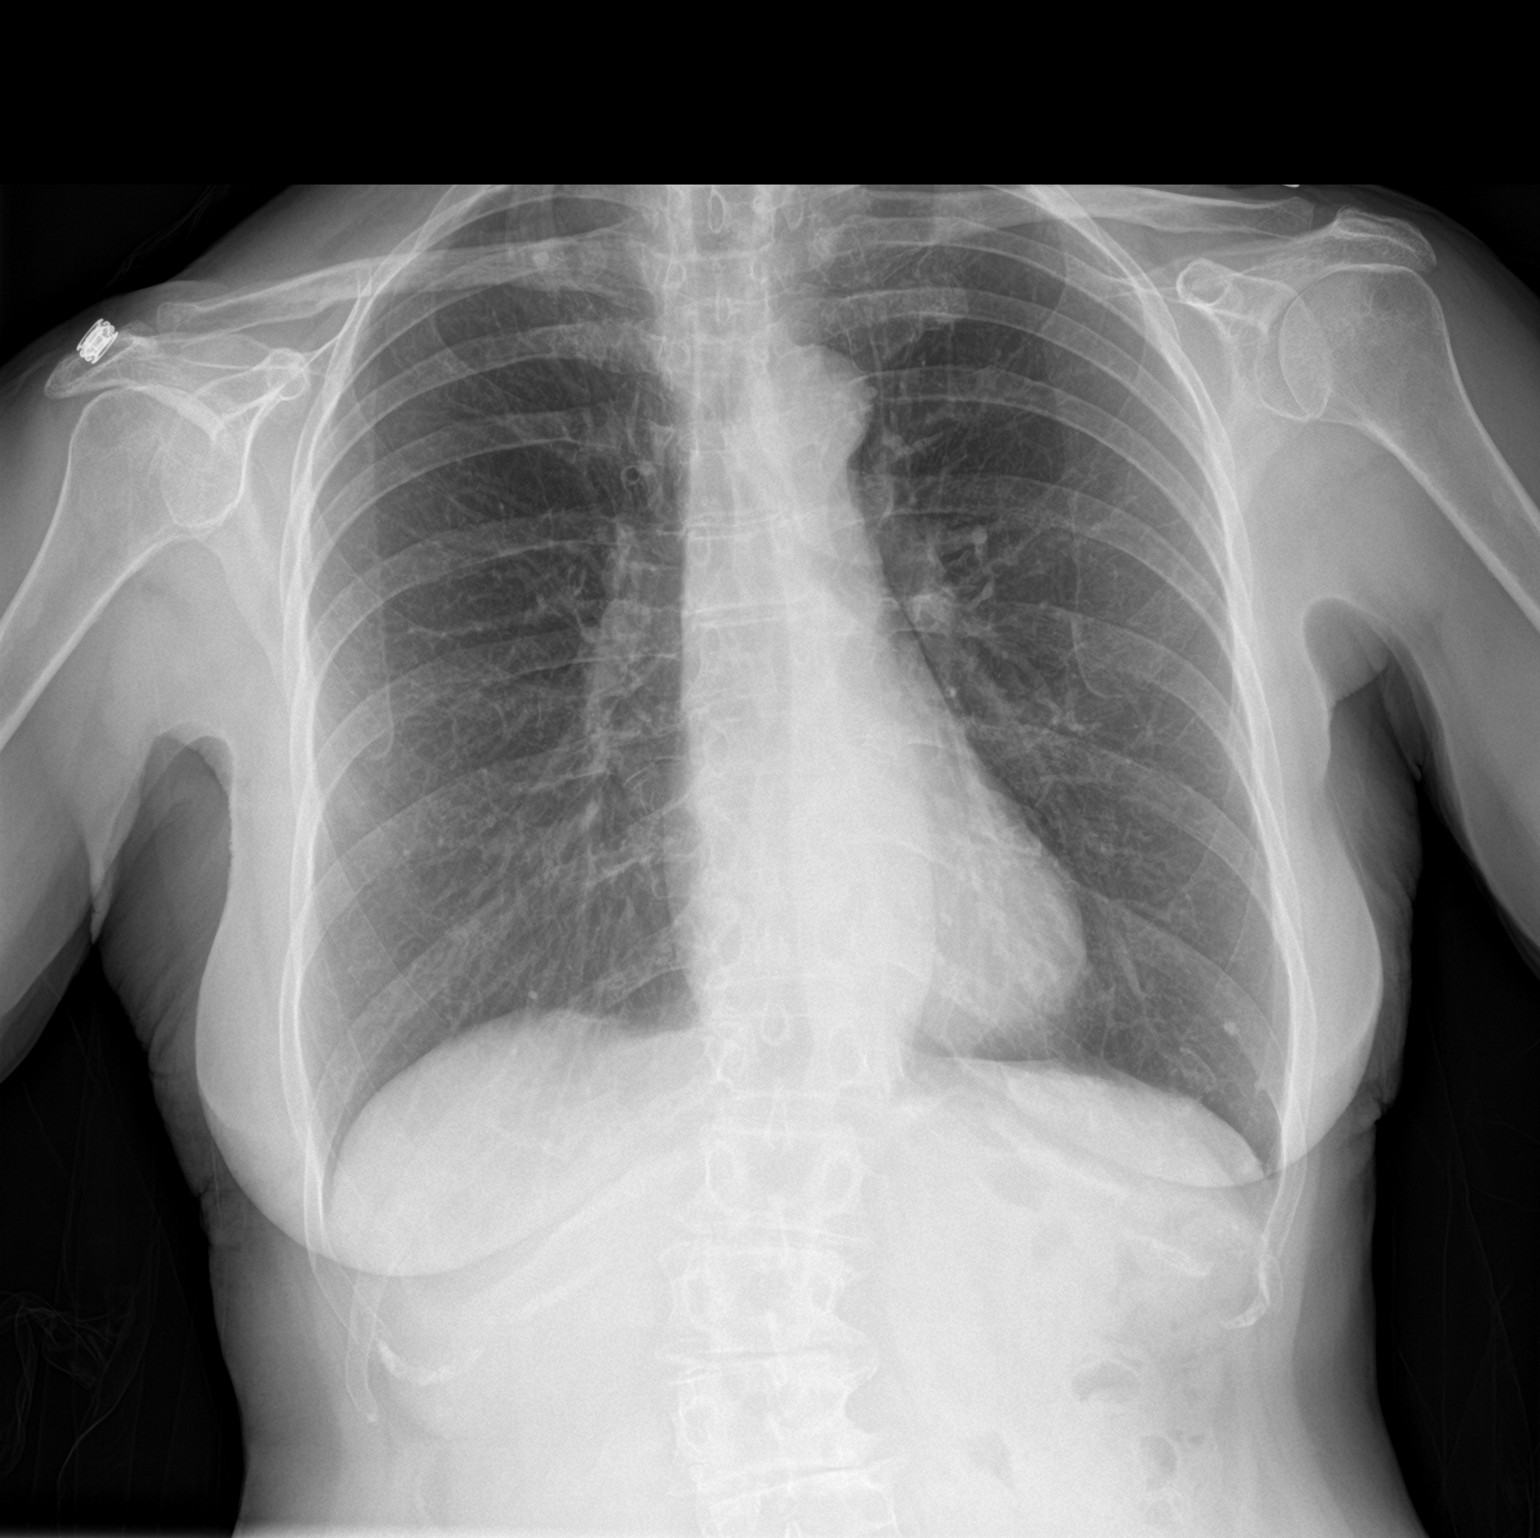

[chest lat]
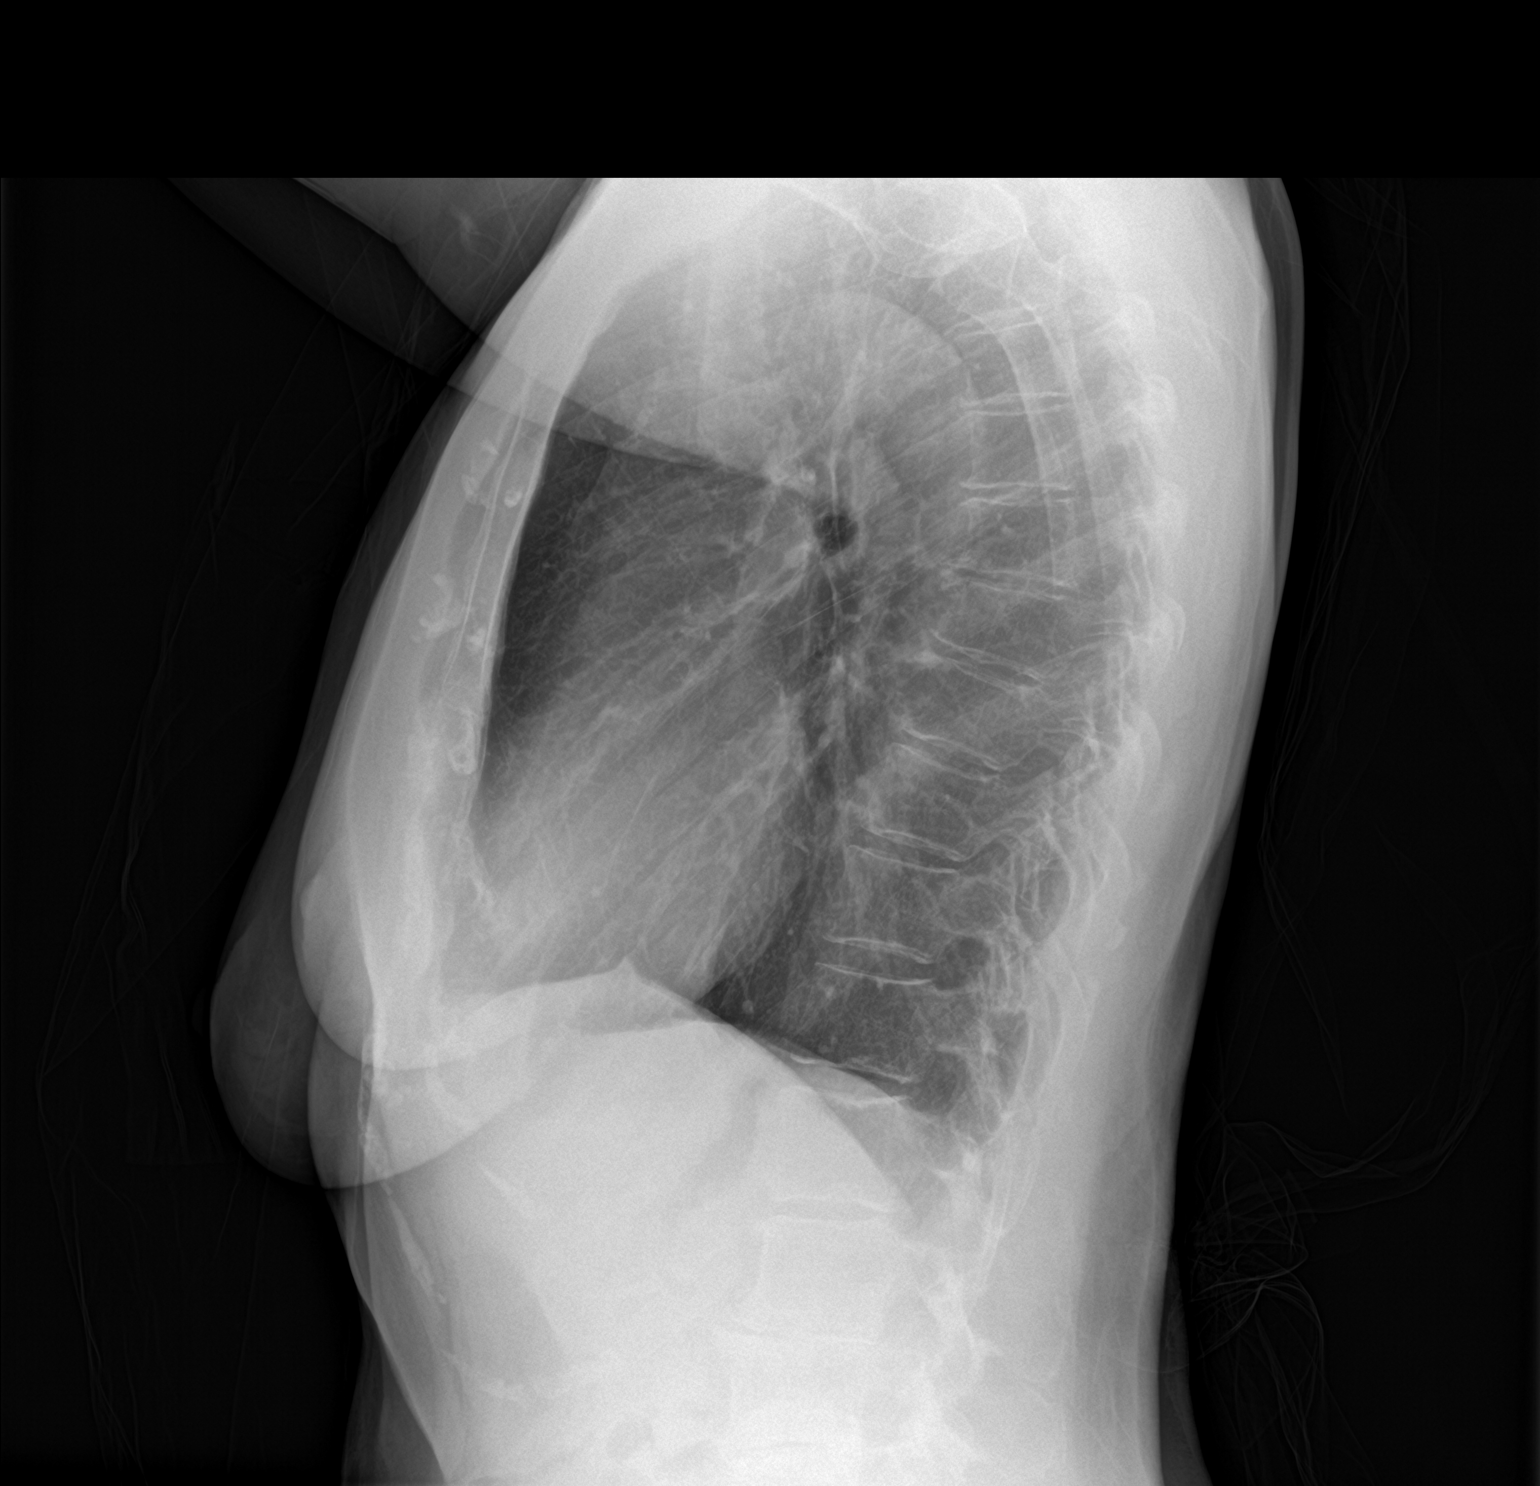

[2 of 2 positions shown; findings below may reference images not displayed]

FINDINGS: Normal sized heart. Small calcified granulomata bilateral.
Otherwise, clear lungs. The lungs are mildly hyperexpanded with mild
peribronchial thickening. Mild scoliosis and mild thoracic spine
degenerative changes. The bones appear somewhat osteopenic.
IMPRESSION: Mild changes of COPD and chronic bronchitis. No acute abnormality.

## 2020-07-16 IMAGING — RF DG HIP (WITH PELVIS) OPERATIVE*R*
1 series · 2 of 2 positions shown · non-contrast
Comparison: None.

CLINICAL DATA: Surgery

EXAM:
OPERATIVE RIGHT HIP (WITH PELVIS IF PERFORMED)  VIEWS
TECHNIQUE: Fluoroscopic spot image(s) were submitted for interpretation
post-operatively.

[Series 1: run · 2 of 2 slices shown]
[im 1/2]
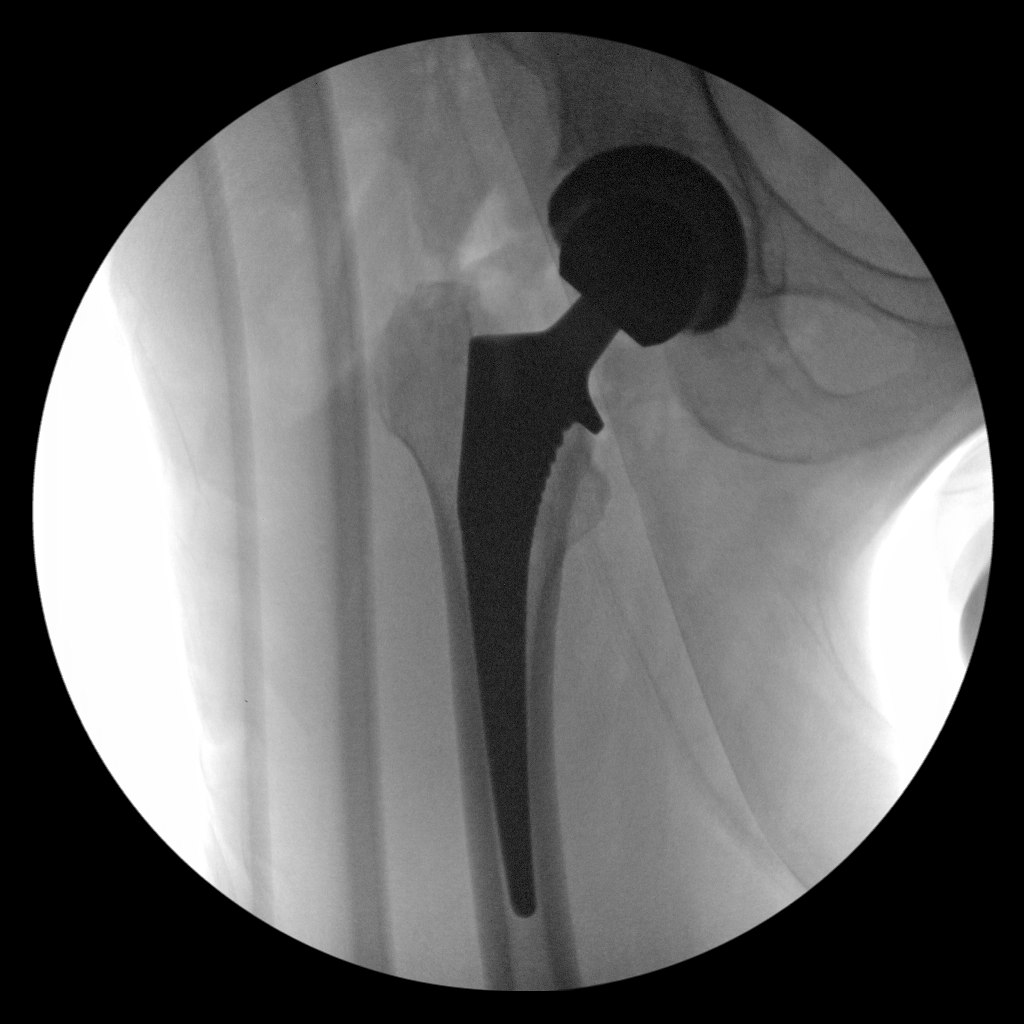
[im 2/2]
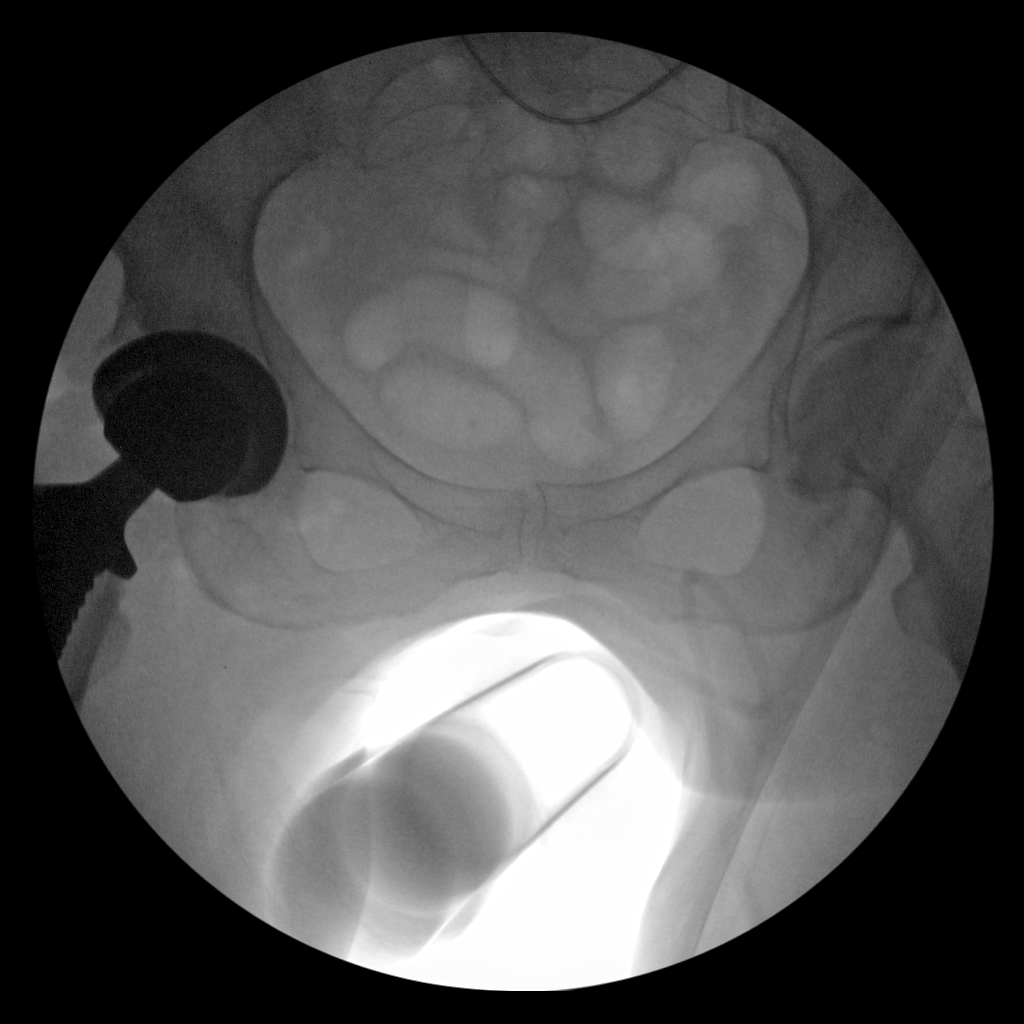

[2 of 2 positions shown; findings below may reference images not displayed]

FINDINGS: Two intraoperative fluoroscopic spot images demonstrated RIGHT hip
arthroplasty hardware. Hardware appears intact and appropriately
positioned.

Fluoroscopy was provided for 35 seconds (1.47 mGy).
IMPRESSION: Intraoperative fluoroscopic images demonstrating RIGHT hip
arthroplasty hardware. No evidence of surgical complicating feature.

## 2020-07-23 DIAGNOSIS — M1711 Unilateral primary osteoarthritis, right knee: Secondary | ICD-10-CM | POA: Diagnosis not present

## 2020-07-23 DIAGNOSIS — M25561 Pain in right knee: Secondary | ICD-10-CM | POA: Diagnosis not present

## 2020-07-24 DIAGNOSIS — Z23 Encounter for immunization: Secondary | ICD-10-CM | POA: Diagnosis not present

## 2020-09-13 DIAGNOSIS — Z20828 Contact with and (suspected) exposure to other viral communicable diseases: Secondary | ICD-10-CM | POA: Diagnosis not present

## 2020-09-30 ENCOUNTER — Telehealth: Payer: Self-pay | Admitting: Family Medicine

## 2020-09-30 NOTE — Telephone Encounter (Signed)
Left message for patient to schedule Annual Wellness Visit.  Please schedule with Nurse Health Advisor Martha Stanley, RN at [facility]  °

## 2020-10-16 ENCOUNTER — Encounter: Payer: Medicare Other | Admitting: Family Medicine

## 2020-10-23 ENCOUNTER — Encounter: Payer: Self-pay | Admitting: Family Medicine

## 2020-10-23 ENCOUNTER — Encounter: Payer: Medicare Other | Admitting: Family Medicine

## 2020-10-23 ENCOUNTER — Telehealth (INDEPENDENT_AMBULATORY_CARE_PROVIDER_SITE_OTHER): Payer: Medicare Other | Admitting: Family Medicine

## 2020-10-23 DIAGNOSIS — J209 Acute bronchitis, unspecified: Secondary | ICD-10-CM | POA: Diagnosis not present

## 2020-10-23 MED ORDER — AMOXICILLIN-POT CLAVULANATE 875-125 MG PO TABS: 1.0000 | ORAL_TABLET | Freq: Two times a day (BID) | ORAL | 0 refills | Status: DC

## 2020-10-23 MED ORDER — BENZONATATE 200 MG PO CAPS: 200.0000 mg | ORAL_CAPSULE | Freq: Two times a day (BID) | ORAL | 0 refills | Status: DC | PRN

## 2020-10-23 NOTE — Patient Instructions (Signed)
Rest, hydrate.  OTC: mucinex (DM if cough) and allegra  Augmentin (antibiotic)   prescribed, take until completed if started.  Tessalon perles prescribed for cough every 12 hours.  Would recommend testing for covid  F/U 2 weeks of not improved.    10 Things You Can Do to Manage Your COVID-19 Symptoms at Home If you have possible or confirmed COVID-19: 1. Stay home except to get medical care. 2. Monitor your symptoms carefully. If your symptoms get worse, call your healthcare provider immediately. 3. Get rest and stay hydrated. 4. If you have a medical appointment, call the healthcare provider ahead of time and tell them that you have or may have COVID-19. 5. For medical emergencies, call 911 and notify the dispatch personnel that you have or may have COVID-19. 6. Cover your cough and sneezes with a tissue or use the inside of your elbow. 7. Wash your hands often with soap and water for at least 20 seconds or clean your hands with an alcohol-based hand sanitizer that contains at least 60% alcohol. 8. As much as possible, stay in a specific room and away from other people in your home. Also, you should use a separate bathroom, if available. If you need to be around other people in or outside of the home, wear a mask. 9. Avoid sharing personal items with other people in your household, like dishes, towels, and bedding. 10. Clean all surfaces that are touched often, like counters, tabletops, and doorknobs. Use household cleaning sprays or wipes according to the label instructions. [email or web address] 03/22/2020 This information is not intended to replace advice given to you by your health care provider. Make sure you discuss any questions you have with your health care provider. Document Revised: 07/08/2020 Document Reviewed: 07/08/2020 Elsevier Patient Education  2021 [employer].

## 2020-10-23 NOTE — Telephone Encounter (Signed)
Provider notify verbally

## 2020-10-23 NOTE — Progress Notes (Signed)
VIRTUAL VISIT VIA VIDEO  I connected with [name] on 10/23/20 at  1:30 PM EST by elemedicine application and verified that I am speaking with the correct person using two identifiers. Location patient: Home Location provider: [facility], Office Persons participating in the virtual visit: Patient, Dr. Raoul Pitch and Samul Dada, CMA  I discussed the limitations of evaluation and management by telemedicine and the availability of in person appointments. The patient expressed understanding and agreed to proceed.   SUBJECTIVE Chief Complaint  Patient presents with  . Cough    Pt c/o cough x 10 days, congestion and sinus fullness x 1 day    HPI: Sara Doyle is a 75 y.o. female present for acute illness of dry cough 10 days, congestion and sinus fullness started today.  Exposure:grandchildren visiting.  Onset:10 days cough.  covid test: she has not tested - her husband tested 10/19/2020.  covid vaccine: series completed 10/2019- booster completed 05/2020 Denies fever, chills, headache. Eating and drinking well. Denies taste and smell perception changes OTC: no OTC  ROS: See pertinent positives and negatives per HPI.  Patient Active Problem List   Diagnosis Date Noted  . 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  . Primary osteoarthritis of right hip 05/20/2018  . Osteoarthritis of right hip 05/05/2017  . Muscle strain of thigh, right, initial encounter 01/27/2017  . Macrocytosis without anemia 10/13/2016  . Elevated cholesterol 10/12/2016  . Osteopenia 12/02/2015  . Vaginal dryness, menopausal 10/04/2015  . BMI 24.0-24.9, adult 10/03/2014  . Encounter for long-term (current) use of medications 10/03/2014    Social History   Tobacco Use  . Smoking status: Former Smoker    Packs/day: 1.00    Years: 25.00    Pack years: 25.00    Types: Cigarettes    Quit date: 09/07/1993    Years since quitting: 27.1  . Smokeless tobacco: Never Used  Substance Use  Topics  . Alcohol use: Yes    Alcohol/week: 4.0 standard drinks    Types: 4 Glasses of wine per week    Comment: 4-5 glasses/week    Current Outpatient Medications:  .  amoxicillin-clavulanate (AUGMENTIN) 875-125 MG tablet, Take 1 tablet by mouth 2 (two) times daily., Disp: 20 tablet, Rfl: 0 .  b complex vitamins tablet, Take 1 tablet by mouth daily., Disp: , Rfl:  .  benzonatate (TESSALON) 200 MG capsule, Take 1 capsule (200 mg total) by mouth 2 (two) times daily as needed for cough., Disp: 20 capsule, Rfl: 0 .  Calcium Carbonate-Vitamin D (OYSTER SHELL CALCIUM/D) 250-125 MG-UNIT TABS, Take by mouth., Disp: , Rfl:  .  OVER THE COUNTER MEDICATION, Areds preservision one tablet daily., Disp: , Rfl:   Allergies  Allergen Reactions  . 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 Demerol [Meperidine] Nausea And Vomiting    OBJECTIVE: There were no vitals taken for this visit. Gen: No acute distress. Nontoxic in appearance.  HENT: AT. [place].  MMM.  Eyes:Pupils Equal Round Reactive to light, Extraocular movements intact,  Conjunctiva without redness, discharge or icterus. CV: no edema Chest: Cough present- dry, no shortness of breath Skin: no rashes, purpura or petechiae.  Neuro:  Normal gait. Alert. Oriented x3  Psych: Normal affect and demeanor. Normal speech. Normal thought content and judgment.  ASSESSMENT AND PLAN: BRIANA FARNER is a 75 y.o. female present for  Acute bronchitis with symptoms > 10 days Rest, hydrate.  OTC: mucinex (DM  if cough) and allegra  Augmentin (antibiotic)   prescribed, take until completed if started.  Tessalon perles prescribed for cough every 12 hours.  Would recommend testing for covid  F/U 2 weeks of not improved.     Howard Pouch, DO 10/23/2020   Return if symptoms worsen or fail to improve.  No orders of the defined types were placed in this encounter.  Meds ordered this encounter   Medications  . amoxicillin-clavulanate (AUGMENTIN) 875-125 MG tablet    Sig: Take 1 tablet by mouth 2 (two) times daily.    Dispense:  20 tablet    Refill:  0    Hold until pt request- if not used within 2 weeks void. Thanks.  . benzonatate (TESSALON) 200 MG capsule    Sig: Take 1 capsule (200 mg total) by mouth 2 (two) times daily as needed for cough.    Dispense:  20 capsule    Refill:  0   Referral Orders  No referral(s) requested today

## 2020-10-30 ENCOUNTER — Ambulatory Visit (INDEPENDENT_AMBULATORY_CARE_PROVIDER_SITE_OTHER): Payer: Medicare Other

## 2020-10-30 VITALS — Ht 61.0 in | Wt 132.0 lb

## 2020-10-30 DIAGNOSIS — Z0000 Encounter for general adult medical examination without abnormal findings: Secondary | ICD-10-CM

## 2020-10-30 NOTE — Progress Notes (Signed)
Subjective:   Sara Doyle is a 75 y.o. female who presents for Medicare Annual (Subsequent) preventive examination.  I connected with [place] today by telephone and verified that I am speaking with the correct person using two identifiers. Location patient: home Location provider: work Persons participating in the virtual visit: patient, Marine scientist.    I discussed the limitations, risks, security and privacy concerns of performing an evaluation and management service by telephone and the availability of in person appointments. I also discussed with the patient that there may be a patient responsible charge related to this service. The patient expressed understanding and verbally consented to this telephonic visit.    Interactive audio and video telecommunications were attempted between this provider and patient, however failed, due to patient having technical difficulties OR patient did not have access to video capability.  We continued and completed visit with audio only.  Some vital signs may be absent or patient reported.   Time Spent with patient on telephone encounter: 25 minutes   Review of Systems     Cardiac Risk Factors include: advanced age (>64men, >40 women);dyslipidemia     Objective:    Today's Vitals   10/30/20 1458 10/30/20 1459  Weight: 132 lb (59.9 kg)   Height: 5\' 1"  (1.549 m)   PainSc:  3    Body mass index is 24.94 kg/m.  Advanced Directives 10/30/2020 10/20/2019 10/14/2018 05/20/2018 05/12/2018 10/11/2017 04/13/2017  Does Patient Have a Medical Advance Directive? Yes Yes Yes No No No No  Type of Advance Directive Living will [place];Living will;Out of facility DNR (pink MOST or yellow form) [place];Living will - - - -  Does patient want to make changes to medical advance directive? - Yes (ED - Information included in AVS) - - - - -  Copy of Healthcare Power of Attorney in Chart? - No - copy requested Yes - validated most  recent copy scanned in chart (See row information) - - - -  Would patient like information on creating a medical advance directive? - - - No - Patient declined No - Patient declined No - Patient declined No - Patient declined    Current Medications (verified) Outpatient Encounter Medications as of 10/30/2020  Medication Sig  . b complex vitamins tablet Take 1 tablet by mouth daily.  . Calcium Carbonate-Vitamin D (OYSTER SHELL CALCIUM/D) 250-125 MG-UNIT TABS Take by mouth.  Marland Kitchen OVER THE COUNTER MEDICATION Areds preservision one tablet daily.  . [DISCONTINUED] amoxicillin-clavulanate (AUGMENTIN) 875-125 MG tablet Take 1 tablet by mouth 2 (two) times daily.  . [DISCONTINUED] benzonatate (TESSALON) 200 MG capsule Take 1 capsule (200 mg total) by mouth 2 (two) times daily as needed for cough.   No facility-administered encounter medications on file as of 10/30/2020.    Allergies (verified) Oysters [shellfish allergy] and Demerol [meperidine]   History: Past Medical History:  Diagnosis Date  . Allergy    Past Surgical History:  Procedure Laterality Date  . BUNIONECTOMY Right   . FRACTURE SURGERY  1969   right leg  . FRACTURE SURGERY  2005   right leg  . SKIN LESION EXCISION Right 07/08/2017   Behind right knee, benign  . 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 TUBAL LIGATION  Family History  Problem Relation Age of Onset  . Hypertension Mother   . Arthritis Father        osteo  . Heart disease Brother        congenital  . Breast cancer Sister 65       breast  . Arthritis Sister   . Hypertension Sister   . Heart disease Brother   . Cancer Brother        prostate, colon  . Arrhythmia Brother        a-fib  . Heart disease Brother   . Colon cancer Neg Hx   .  Esophageal cancer Neg Hx   . Rectal cancer Neg Hx   . Stomach cancer Neg Hx    Social History   Socioeconomic History  . Marital status: Married    Spouse name: Not on file  . Number of children: 3  . Years of education: Not on file  . Highest education level: Not on file  Occupational History    Comment: retired  Tobacco Use  . Smoking status: Former Smoker    Packs/day: 1.00    Years: 25.00    Pack years: 25.00    Types: Cigarettes    Quit date: 09/07/1993    Years since quitting: 27.1  . Smokeless tobacco: Never Used  Vaping Use  . Vaping Use: Never used  Substance and Sexual Activity  . Alcohol use: Yes    Alcohol/week: 4.0 standard drinks    Types: 4 Glasses of wine per week    Comment: 4-5 glasses/week  . Drug use: No  . Sexual activity: Yes    Birth control/protection: Post-menopausal  Other Topics Concern  . Not on file  Social History Narrative   Ms. Newby lives with her husband, She relocated from [place] Nov., 2015. She is retired, has 3 grown sons that live in [place].   Smoke alarm in the home, wears her seatbelt, uses sunscreen.   Ambulates independently. No dentures.   Has dentist with regular appts.    Feels safe in her relationships   Social Determinants of Health   Financial Resource Strain: Low Risk   . Difficulty of Paying Living Expenses: Not hard at all  Food Insecurity: No Food Insecurity  . Worried About Charity fundraiser in the Last Year: Never true  . Ran Out of Food in the Last Year: Never true  Transportation Needs: No Transportation Needs  . Lack of Transportation (Medical): No  . Lack of Transportation (Non-Medical): No  Physical Activity: Sufficiently Active  . Days of Exercise per Week: 3 days  . Minutes of Exercise per Session: 60 min  Stress: No Stress Concern Present  . Feeling of Stress : Not at all  Social Connections: Moderately Integrated  . Frequency of Communication with Friends and Family: More than three times a  week  . Frequency of Social Gatherings with Friends and Family: Once a week  . Attends Religious Services: Never  . Active Member of Clubs or Organizations: Yes  . Attends Archivist Meetings: Never  . Marital Status: Married    Tobacco Counseling Counseling given: Not Answered   Clinical Intake:  Pre-visit preparation completed: Yes  Pain : 0-10 Pain Score: 3  Pain Type: Acute pain (started after lifting her grandaughter) Pain Location: Shoulder Pain Orientation: Left Pain Onset: In the past 7 days Pain Frequency: Intermittent     Nutritional Status: BMI of 19-24  Normal Nutritional Risks: Other (  Comment) Diabetes: No  How often do you need to have someone help you when you read instructions, pamphlets, or other written materials from your doctor or pharmacy?: 1 - Never  Diabetic?No  Interpreter Needed?: No  Information entered by :: Caroleen Hamman LPN   Activities of Daily Living In your present state of health, do you have any difficulty performing the following activities: 10/30/2020  Hearing? N  Vision? N  Difficulty concentrating or making decisions? N  Walking or climbing stairs? N  Dressing or bathing? N  Doing errands, shopping? N  Preparing Food and eating ? N  Using the Toilet? N  In the past six months, have you accidently leaked urine? Y  Comment occasionally with sneezing  Do you have problems with loss of bowel control? N  Managing your Medications? N  Managing your Finances? N  Housekeeping or managing your Housekeeping? N  Some recent data might be hidden    Patient Care Team: Ma Hillock, DO as PCP - General (Family Medicine) Milus Banister, MD as Attending Physician (Gastroenterology) Rutherford Guys, MD as Consulting Physician (Ophthalmology) Sallyanne Kuster (Dentistry) Dickie La, MD as Consulting Physician (Family Medicine)  Indicate any recent Medical Services you may have received from other than [facility] providers in  the past year (date may be approximate).     Assessment:   This is a routine wellness examination for [place].  Hearing/Vision screen  Hearing Screening   125Hz  250Hz  500Hz  1000Hz  2000Hz  3000Hz  4000Hz  6000Hz  8000Hz   Right ear:           Left ear:           Comments: No issues  Vision Screening Comments: Wears glasses Last eye exam-09/2020-Dr. Gershon Crane  Dietary issues and exercise activities discussed: Current Exercise Habits: Home exercise routine, Type of exercise: strength training/weights;stretching;Other - see comments (eliptical), Time (Minutes): 60, Frequency (Times/Week): 3, Weekly Exercise (Minutes/Week): 180, Intensity: Mild, Exercise limited by: None identified  Goals    . Patient Stated     Stay active (to travel).     . Patient Stated     Have knee replacement surgery & cataract surgery      Depression Screen PHQ 2/9 Scores 10/30/2020 10/23/2020 10/20/2019 10/14/2018 10/11/2017 10/05/2016 10/04/2015  PHQ - 2 Score 0 0 0 0 0 0 0    Fall Risk Fall Risk  10/30/2020 10/23/2020 10/20/2019 10/14/2018 10/11/2017  Falls in the past year? 1 0 1 0 No  Number falls in past yr: 1 0 1 - -  Injury with Fall? 0 0 0 - -  Risk for fall due to : History of fall(s) - History of fall(s) - -  Follow up Falls prevention discussed Falls evaluation completed Falls evaluation completed;Education provided;Falls prevention discussed - -    FALL RISK PREVENTION PERTAINING TO THE HOME:  Any stairs in or around the home? Yes  If so, are there any without handrails? No  Home free of loose throw rugs in walkways, pet beds, electrical cords, etc? Yes  Adequate lighting in your home to reduce risk of falls? Yes   ASSISTIVE DEVICES UTILIZED TO PREVENT FALLS:  Life alert? No  Use of a cane, walker or w/c? No  Grab bars in the bathroom? No  Shower chair or bench in shower? Yes  Elevated toilet seat or a handicapped toilet? No   TIMED UP AND GO:  Was the test performed? No . Phone visit   Cognitive  Function:Normal cognitive status assessed by  this Nurse Health Advisor. No abnormalities found.   MMSE - Mini Mental State Exam 10/14/2018 10/11/2017  Orientation to time 5 5  Orientation to Place 5 5  Registration 3 3  Attention/ Calculation 5 5  Recall 2 3  Language- name 2 objects 2 2  Language- repeat 1 1  Language- follow 3 step command 3 3  Language- read & follow direction 1 1  Write a sentence 1 1  Copy design 1 1  Total score 29 30        Immunizations Immunization History  Administered Date(s) Administered  . Influenza, High Dose Seasonal PF 07/04/2018, 06/17/2019  . Influenza-Unspecified 06/23/2015, 06/21/2016, 06/07/2017, 07/24/2020  . PFIZER(Purple Top)SARS-COV-2 Vaccination 09/27/2019, 10/16/2019, 07/02/2020  . Pneumococcal Conjugate-13 02/12/2014  . Pneumococcal Polysaccharide-23 01/06/2011  . Tdap 01/06/2003    TDAP status: Due, Education has been provided regarding the importance of this vaccine. Advised may receive this vaccine at local pharmacy or Health Dept. Aware to provide a copy of the vaccination record if obtained from local pharmacy or Health Dept. Verbalized acceptance and understanding.  Flu Vaccine status: Up to date  Pneumococcal vaccine status: Up to date  Covid-19 vaccine status: Completed vaccines  Qualifies for Shingles Vaccine? Yes   Zostavax completed No   Shingrix Completed?: No.    Education has been provided regarding the importance of this vaccine. Patient has been advised to call insurance company to determine out of pocket expense if they have not yet received this vaccine. Advised may also receive vaccine at local pharmacy or Health Dept. Verbalized acceptance and understanding.  Screening Tests Health Maintenance  Topic Date Due  . TETANUS/TDAP  10/23/2021 (Originally 10/03/2020)  . MAMMOGRAM  04/18/2021  . DEXA SCAN  04/18/2022  . INFLUENZA VACCINE  Completed  . COVID-19 Vaccine  Completed  . Hepatitis C Screening  Completed   . PNA vac Low Risk Adult  Completed  . COLONOSCOPY (Pts 45-38yrs Insurance coverage will need to be confirmed)  Discontinued    Health Maintenance  There are no preventive care reminders to display for this patient.  Colorectal cancer screening: No longer required.   Mammogram status: Due-Patient would like to call herself to schedule  Bone Density status: Completed 04/19/2019. Results reflect: Bone density results: OSTEOPENIA. Repeat every 2 years.  Lung Cancer Screening: (Low Dose CT Chest recommended if Age 29-80 years, 30 pack-year currently smoking OR have quit w/in 15years.) does not qualify.    Additional Screening:  Hepatitis C Screening:Completed 10/12/2016  Vision Screening: Recommended annual ophthalmology exams for early detection of glaucoma and other disorders of the eye. Is the patient up to date with their annual eye exam?  Yes  Who is the provider or what is the name of the office in which the patient attends annual eye exams? Dr. Gershon Crane   Dental Screening: Recommended annual dental exams for proper oral hygiene  Community Resource Referral / Chronic Care Management: CRR required this visit?  No   CCM required this visit?  No      Plan:     I have personally reviewed and noted the following in the patient's chart:   . Medical and social history . Use of alcohol, tobacco or illicit drugs  . Current medications and supplements . Functional ability and status . Nutritional status . Physical activity . Advanced directives . List of other physicians . Hospitalizations, surgeries, and ER visits in previous 12 months . Vitals . Screenings to include cognitive, depression, and falls .  Referrals and appointments  In addition, I have reviewed and discussed with patient certain preventive protocols, quality metrics, and best practice recommendations. A written personalized care plan for preventive services as well as general preventive health recommendations  were provided to patient.   Due to this being a telephonic visit, the after visit summary with patients personalized plan was offered to patient via mail or my-chart.  Patient would like to access on my-chart.   Marta Antu, LPN   [id number]/2233  Nurse Health Advisor  Nurse Notes: None

## 2020-10-30 NOTE — Patient Instructions (Signed)
Sara Doyle , Thank you for taking time to complete your Medicare Wellness Visit. I appreciate your ongoing commitment to your health goals. Please review the following plan we discussed and let me know if I can assist you in the future.   Screening recommendations/referrals: Colonoscopy: No longer required Mammogram: Completed 04/19/2019-Per our conversation, you will call to schedule. Bone Density: Completed 04/19/2019-Due 04/18/2021 Recommended yearly ophthalmology/optometry visit for glaucoma screening and checkup Recommended yearly dental visit for hygiene and checkup  Vaccinations: Influenza vaccine: Up to date Pneumococcal vaccine: Completed vaccines Tdap vaccine: Due-Per our conversation, you will check your records to see if you have any documentation of vaccine. Shingles vaccine: Completed vaccines-Please bring documentation to your next office visit.  Covid-19:Completed vaccines  Advanced directives: Copy in chart  Conditions/risks identified: See problem list  Next appointment: Follow up in one year for your annual wellness visit    Preventive Care 65 Years and Older, Female Preventive care refers to lifestyle choices and visits with your health care provider that can promote health and wellness. What does preventive care include?  A yearly physical exam. This is also called an annual well check.  Dental exams once or twice a year.  Routine eye exams. Ask your health care provider how often you should have your eyes checked.  Personal lifestyle choices, including:  Daily care of your teeth and gums.  Regular physical activity.  Eating a healthy diet.  Avoiding tobacco and drug use.  Limiting alcohol use.  Practicing safe sex.  Taking low-dose aspirin every day.  Taking vitamin and mineral supplements as recommended by your health care provider. What happens during an annual well check? The services and screenings done by your health care provider during  your annual well check will depend on your age, overall health, lifestyle risk factors, and family history of disease. Counseling  Your health care provider may ask you questions about your:  Alcohol use.  Tobacco use.  Drug use.  Emotional well-being.  Home and relationship well-being.  Sexual activity.  Eating habits.  History of falls.  Memory and ability to understand (cognition).  Work and work Statistician.  Reproductive health. Screening  You may have the following tests or measurements:  Height, weight, and BMI.  Blood pressure.  Lipid and cholesterol levels. These may be checked every 5 years, or more frequently if you are over 32 years old.  Skin check.  Lung cancer screening. You may have this screening every year starting at age 22 if you have a 30-pack-year history of smoking and currently smoke or have quit within the past 15 years.  Fecal occult blood test (FOBT) of the stool. You may have this test every year starting at age 19.  Flexible sigmoidoscopy or colonoscopy. You may have a sigmoidoscopy every 5 years or a colonoscopy every 10 years starting at age 62.  Hepatitis C blood test.  Hepatitis B blood test.  Sexually transmitted disease (STD) testing.  Diabetes screening. This is done by checking your blood sugar (glucose) after you have not eaten for a while (fasting). You may have this done every 1-3 years.  Bone density scan. This is done to screen for osteoporosis. You may have this done starting at age 70.  Mammogram. This may be done every 1-2 years. Talk to your health care provider about how often you should have regular mammograms. Talk with your health care provider about your test results, treatment options, and if necessary, the need for more tests. Vaccines  Your health care provider may recommend certain vaccines, such as:  Influenza vaccine. This is recommended every year.  Tetanus, diphtheria, and acellular pertussis (Tdap,  Td) vaccine. You may need a Td booster every 10 years.  Zoster vaccine. You may need this after age 11.  Pneumococcal 13-valent conjugate (PCV13) vaccine. One dose is recommended after age 59.  Pneumococcal polysaccharide (PPSV23) vaccine. One dose is recommended after age 83. Talk to your health care provider about which screenings and vaccines you need and how often you need them. This information is not intended to replace advice given to you by your health care provider. Make sure you discuss any questions you have with your health care provider. Document Released: 09/20/2015 Document Revised: 05/13/2016 Document Reviewed: 06/25/2015 Elsevier Interactive Patient Education  2017 [place] Prevention in the Home Falls can cause injuries. They can happen to people of all ages. There are many things you can do to make your home safe and to help prevent falls. What can I do on the outside of my home?  Regularly fix the edges of walkways and driveways and fix any cracks.  Remove anything that might make you trip as you walk through a door, such as a raised step or threshold.  Trim any bushes or trees on the path to your home.  Use bright outdoor lighting.  Clear any walking paths of anything that might make someone trip, such as rocks or tools.  Regularly check to see if handrails are loose or broken. Make sure that both sides of any steps have handrails.  Any raised decks and porches should have guardrails on the edges.  Have any leaves, snow, or ice cleared regularly.  Use sand or salt on walking paths during winter.  Clean up any spills in your garage right away. This includes oil or grease spills. What can I do in the bathroom?  Use night lights.  Install grab bars by the toilet and in the tub and shower. Do not use towel bars as grab bars.  Use non-skid mats or decals in the tub or shower.  If you need to sit down in the shower, use a plastic, non-slip  stool.  Keep the floor dry. Clean up any water that spills on the floor as soon as it happens.  Remove soap buildup in the tub or shower regularly.  Attach bath mats securely with double-sided non-slip rug tape.  Do not have throw rugs and other things on the floor that can make you trip. What can I do in the bedroom?  Use night lights.  Make sure that you have a light by your bed that is easy to reach.  Do not use any sheets or blankets that are too big for your bed. They should not hang down onto the floor.  Have a firm chair that has side arms. You can use this for support while you get dressed.  Do not have throw rugs and other things on the floor that can make you trip. What can I do in the kitchen?  Clean up any spills right away.  Avoid walking on wet floors.  Keep items that you use a lot in easy-to-reach places.  If you need to reach something above you, use a strong step stool that has a grab bar.  Keep electrical cords out of the way.  Do not use floor polish or wax that makes floors slippery. If you must use wax, use non-skid floor wax.  Do  not have throw rugs and other things on the floor that can make you trip. What can I do with my stairs?  Do not leave any items on the stairs.  Make sure that there are handrails on both sides of the stairs and use them. Fix handrails that are broken or loose. Make sure that handrails are as long as the stairways.  Check any carpeting to make sure that it is firmly attached to the stairs. Fix any carpet that is loose or worn.  Avoid having throw rugs at the top or bottom of the stairs. If you do have throw rugs, attach them to the floor with carpet tape.  Make sure that you have a light switch at the top of the stairs and the bottom of the stairs. If you do not have them, ask someone to add them for you. What else can I do to help prevent falls?  Wear shoes that:  Do not have high heels.  Have rubber bottoms.  Are  comfortable and fit you well.  Are closed at the toe. Do not wear sandals.  If you use a stepladder:  Make sure that it is fully opened. Do not climb a closed stepladder.  Make sure that both sides of the stepladder are locked into place.  Ask someone to hold it for you, if possible.  Clearly mark and make sure that you can see:  Any grab bars or handrails.  First and last steps.  Where the edge of each step is.  Use tools that help you move around (mobility aids) if they are needed. These include:  Canes.  Walkers.  Scooters.  Crutches.  Turn on the lights when you go into a dark area. Replace any light bulbs as soon as they burn out.  Set up your furniture so you have a clear path. Avoid moving your furniture around.  If any of your floors are uneven, fix them.  If there are any pets around you, be aware of where they are.  Review your medicines with your doctor. Some medicines can make you feel dizzy. This can increase your chance of falling. Ask your doctor what other things that you can do to help prevent falls. This information is not intended to replace advice given to you by your health care provider. Make sure you discuss any questions you have with your health care provider. Document Released: 06/20/2009 Document Revised: 01/30/2016 Document Reviewed: 09/28/2014 Elsevier Interactive Patient Education  2017 [employer].

## 2020-10-31 ENCOUNTER — Other Ambulatory Visit: Payer: Self-pay

## 2020-11-01 ENCOUNTER — Ambulatory Visit (INDEPENDENT_AMBULATORY_CARE_PROVIDER_SITE_OTHER): Payer: Medicare Other | Admitting: Family Medicine

## 2020-11-01 ENCOUNTER — Encounter: Payer: Self-pay | Admitting: Family Medicine

## 2020-11-01 VITALS — BP 125/85 | HR 79 | Temp 98.2°F | Ht 60.75 in | Wt 132.0 lb

## 2020-11-01 DIAGNOSIS — E7800 Pure hypercholesterolemia, unspecified: Secondary | ICD-10-CM

## 2020-11-01 DIAGNOSIS — M8580 Other specified disorders of bone density and structure, unspecified site: Secondary | ICD-10-CM

## 2020-11-01 DIAGNOSIS — Z131 Encounter for screening for diabetes mellitus: Secondary | ICD-10-CM

## 2020-11-01 DIAGNOSIS — R7309 Other abnormal glucose: Secondary | ICD-10-CM | POA: Diagnosis not present

## 2020-11-01 DIAGNOSIS — D7589 Other specified diseases of blood and blood-forming organs: Secondary | ICD-10-CM

## 2020-11-01 DIAGNOSIS — Z1231 Encounter for screening mammogram for malignant neoplasm of breast: Secondary | ICD-10-CM

## 2020-11-01 MED ORDER — TETANUS-DIPHTH-ACELL PERTUSSIS 5-2-15.5 LF-MCG/0.5 IM SUSP: 0.5000 mL | Freq: Once | INTRAMUSCULAR | 0 refills | Status: AC

## 2020-11-01 NOTE — Patient Instructions (Addendum)
Health Maintenance After Age 76 After age 64, you are at a higher risk for certain long-term diseases and infections as well as injuries from falls. Falls are a major cause of broken bones and head injuries in people who are older than age 41. Getting regular preventive care can help to keep you healthy and well. Preventive care includes getting regular testing and making lifestyle changes as recommended by your health care provider. Talk with your health care provider about:  Which screenings and tests you should have. A screening is a test that checks for a disease when you have no symptoms.  A diet and exercise plan that is right for you. What should I know about screenings and tests to prevent falls? Screening and testing are the best ways to find a health problem early. Early diagnosis and treatment give you the best chance of managing medical conditions that are common after age 19. Certain conditions and lifestyle choices may make you more likely to have a fall. Your health care provider may recommend:  Regular vision checks. Poor vision and conditions such as cataracts can make you more likely to have a fall. If you wear glasses, make sure to get your prescription updated if your vision changes.  Medicine review. Work with your health care provider to regularly review all of the medicines you are taking, including over-the-counter medicines. Ask your health care provider about any side effects that may make you more likely to have a fall. Tell your health care provider if any medicines that you take make you feel dizzy or sleepy.  Osteoporosis screening. Osteoporosis is a condition that causes the bones to get weaker. This can make the bones weak and cause them to break more easily.  Blood pressure screening. Blood pressure changes and medicines to control blood pressure can make you feel dizzy.  Strength and balance checks. Your health care provider may recommend certain tests to check your  strength and balance while standing, walking, or changing positions.  Foot health exam. Foot pain and numbness, as well as not wearing proper footwear, can make you more likely to have a fall.  Depression screening. You may be more likely to have a fall if you have a fear of falling, feel emotionally low, or feel unable to do activities that you used to do.  Alcohol use screening. Using too much alcohol can affect your balance and may make you more likely to have a fall. What actions can I take to lower my risk of falls? General instructions  Talk with your health care provider about your risks for falling. Tell your health care provider if: ? You fall. Be sure to tell your health care provider about all falls, even ones that seem minor. ? You feel dizzy, sleepy, or off-balance.  Take over-the-counter and prescription medicines only as told by your health care provider. These include any supplements.  Eat a healthy diet and maintain a healthy weight. A healthy diet includes low-fat dairy products, low-fat (lean) meats, and fiber from whole grains, beans, and lots of fruits and vegetables. Home safety  Remove any tripping hazards, such as rugs, cords, and clutter.  Install safety equipment such as grab bars in bathrooms and safety rails on stairs.  Keep rooms and walkways well-lit. Activity  Follow a regular exercise program to stay fit. This will help you maintain your balance. Ask your health care provider what types of exercise are appropriate for you.  If you need a cane or walker,  use it as recommended by your health care provider.  Wear supportive shoes that have nonskid soles.   Lifestyle  Do not drink alcohol if your health care provider tells you not to drink.  If you drink alcohol, limit how much you have: ? 0-1 drink a day for women. ? 0-2 drinks a day for men.  Be aware of how much alcohol is in your drink. In the [place] one drink equals one typical bottle of beer (12  oz), one-half glass of wine (5 oz), or one shot of hard liquor (1 oz).  Do not use any products that contain nicotine or tobacco, such as cigarettes and e-cigarettes. If you need help quitting, ask your health care provider. Summary  Having a healthy lifestyle and getting preventive care can help to protect your health and wellness after age 7.  Screening and testing are the best way to find a health problem early and help you avoid having a fall. Early diagnosis and treatment give you the best chance for managing medical conditions that are more common for people who are older than age 4.  Falls are a major cause of broken bones and head injuries in people who are older than age 46. Take precautions to prevent a fall at home.  Work with your health care provider to learn what changes you can make to improve your health and wellness and to prevent falls. This information is not intended to replace advice given to you by your health care provider. Make sure you discuss any questions you have with your health care provider. Document Revised: 12/15/2018 Document Reviewed: 07/07/2017 Elsevier Patient Education  2021 [employer].   If you are age 44 or older, your body mass index should be between 23-30. Your Body mass index is Body mass index is 25.15 kg/m.Marland Kitchen If this is above the aforementioned range listed, you are consider overweight and if BMI > 35 you are conisdered obese, by medical definition and standards. Routine daily exercise and dietary modifications are encouraged. If you would like additional guidance on weight loss, please make an appointment for weight loss counseling - must be an appointment dedicate to weight loss counseling alone. I would be happy to help you.   If you are age 24 or younger, your body mass index should be between 19-25. Your Body mass index is Body mass index is 25.15 kg/m. Marland Kitchen If this is above the aformentioned range listed, you are consider overwieght and obese  if BMI > 30, by medical definition and standards. Routine daily exercise and dietary modifications are encouraged. If you would like additional guidance on weight loss, please make an appointment for weight loss counseling - must be an appointment dedicate to weight loss counseling alone. I would be happy to help you.

## 2020-11-01 NOTE — Addendum Note (Signed)
Addended by: Howard Pouch A on: 11/01/2020 01:42 PM   Modules accepted: Level of Service

## 2020-11-01 NOTE — Progress Notes (Signed)
This visit occurred during the SARS-CoV-2 public health emergency.  Safety protocols were in place, including screening questions prior to the visit, additional usage of staff PPE, and extensive cleaning of exam room while observing appropriate contact time as indicated for disinfecting solutions.    Patient ID: Sara Doyle, female  DOB: 29-Mar-1946, 75 y.o.   MRN: [id number] Patient Care Team    Relationship Specialty Notifications Start End  Ma Hillock, DO PCP - General Family Medicine  05/10/15   Milus Banister, MD Attending Physician Gastroenterology  10/05/16   Rutherford Guys, MD Consulting Physician Ophthalmology  10/05/16   Sallyanne Kuster  Dentistry  10/05/16   Dickie La, MD Consulting Physician Family Medicine  10/11/17    Comment: (ortho)    Chief Complaint  Patient presents with  . Annual Exam    Pt is not fasting    Subjective:  Sara Doyle is a 75 y.o.  Female  present for [facility] All past medical history, surgical history, allergies, family history, immunizations, medications and social history were updated in the electronic medical record today. All recent labs, ED visits and hospitalizations within the last year were reviewed.   Colonoscopy: no longer needs screening.  Mammogram (50-74): completed 04/2019 [facility]- pt desires every 2 yr testing.  Immunizations: tdap due 2022- printed. Flu UTD 2021. Covid vaccines completed. PNA series completed. Shingrix completed Infectious disease screening: completed Hep C Glaucoma screen: 08/2019 by Dr. Gershon Crane Hearing: Whisper test, no barriers identified. Assistive device: none Oxygen YBW:[facility] Patient has a Dental home. Hospitalizations/ED visits: reviewed   Osteopenia, unspecified location dexa ordered and pt taking vit d supplement.   Elevated cholesterol Has been diet controlled.   Macrocytosis without anemia Uncertain etiology. She is die for lab recheck today. She does not drink alcohol  routinely.    Depression screen [facility] 2/9 11/01/2020 10/30/2020 10/23/2020 10/20/2019 10/14/2018  Decreased Interest 0 0 0 0 0  Down, Depressed, Hopeless 0 0 0 0 0  PHQ - 2 Score 0 0 0 0 0   No flowsheet data found.   Immunization History  Administered Date(s) Administered  . Influenza, High Dose Seasonal PF 07/04/2018, 06/17/2019  . Influenza-Unspecified 06/23/2015, 06/21/2016, 06/07/2017, 07/24/2020  . PFIZER(Purple Top)SARS-COV-2 Vaccination 09/27/2019, 10/16/2019, 07/02/2020  . Pneumococcal Conjugate-13 02/12/2014  . Pneumococcal Polysaccharide-23 01/06/2011  . Tdap 01/06/2003    Past Medical History:  Diagnosis Date  . Allergy    Allergies  Allergen Reactions  . 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 Demerol [Meperidine] Nausea And Vomiting   Past Surgical History:  Procedure Laterality Date  . BUNIONECTOMY Right   . FRACTURE SURGERY  1969   right leg  . FRACTURE SURGERY  2005   right leg  . SKIN LESION EXCISION Right 07/08/2017   Behind right knee, benign  . 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 TUBAL LIGATION     Family History  Problem Relation Age of Onset  . Hypertension Mother   . Arthritis Father        osteo  . Heart disease Brother        congenital  . Breast cancer Sister 24       breast  . Arthritis  Sister   . Hypertension Sister   . Heart disease Brother   . Cancer Brother        prostate, colon  . Arrhythmia Brother        a-fib  . Heart disease Brother   . Colon cancer Neg Hx   . Esophageal cancer Neg Hx   . Rectal cancer Neg Hx   . Stomach cancer Neg Hx    Social History   Social History Narrative   Ms. Gunner lives with her husband, She relocated from [place] Nov.,  2015. She is retired, has 3 grown sons that live in [place].   Smoke alarm in the home, wears her seatbelt, uses sunscreen.   Ambulates independently. No dentures.   Has dentist with regular appts.    Feels safe in her relationships    Allergies as of 11/01/2020      Reactions   Oysters [shellfish Allergy] Anaphylaxis, Swelling   Tongue swelling/difficulty breathing   Demerol [meperidine] Nausea And Vomiting      Medication List       Accurate as of November 01, 2020  1:37 PM. If you have any questions, ask your nurse or doctor.        b complex vitamins tablet Take 1 tablet by mouth daily.   [facility] Glucosamine 1500 MG Tabs Generic drug: Glucosamine HCl Take by mouth.   OVER THE COUNTER MEDICATION Areds preservision one tablet daily.   Oyster Shell Calcium/D 250-125 MG-UNIT Tabs Take by mouth.   Tdap 01-06-14.5 LF-MCG/0.5 injection Commonly known as: ADACEL Inject 0.5 mLs into the muscle once for 1 dose. Started by: Howard Pouch, DO       All past medical history, surgical history, allergies, family history, immunizations andmedications were updated in the EMR today and reviewed under the history and medication portions of their EMR.     No results found for this or any previous visit (from the past 2160 hour(s)).  ROS: 14 pt review of systems performed and negative (unless mentioned in an HPI)  Objective: BP 125/85   Pulse 79   Temp 98.2 F (36.8 C) (Oral)   Ht 5' 0.75" (1.543 m)   Wt 132 lb (59.9 kg)   SpO2 96%   BMI 25.15 kg/m  Gen: Afebrile. No acute distress. Nontoxic in appearance, well-developed, well-nourished,  Pleasant female.  HENT: AT. [place]. Bilateral TM visualized and normal in appearance, normal external auditory canal. MMM, no oral lesions, adequate dentition. Bilateral nares within normal limits. Throat without erythema, ulcerations or exudates. no Cough on exam, no hoarseness on exam. Eyes:Pupils Equal Round Reactive to light, Extraocular movements  intact,  Conjunctiva without redness, discharge or icterus. Neck/lymp/endocrine: Supple,no lymphadenopathy, no thyromegaly CV: RRR no murmur, no edema, +2/4 P posterior tibialis pulses.  Chest: CTAB, no wheeze, rhonchi or crackles. Normal  Respiratory effort. good Air movement. Abd: Soft. flat. NTND. BS present. no Masses palpated. No hepatosplenomegaly. No rebound tenderness or guarding. Skin: no rashes, purpura or petechiae. Warm and well-perfused. Skin intact. Neuro/Msk:  Normal gait. PERLA. EOMi. Alert. Oriented x3.  Cranial nerves II through XII intact. Muscle strength 5/5 upper/lower extremity. DTRs equal bilaterally. Psych: Normal affect, dress and demeanor. Normal speech. Normal thought content and judgment.  No exam data present  Assessment/plan: Sara Doyle is a 75 y.o. female present for [facility] Osteopenia, unspecified location Bone density is ordered due 04/19/2021 Continue vit d supplement.   Elevate d cholesterol/Routine general medical examination at a health care facility -  Comprehensive metabolic panel - Lipid panel - TSH  Macrocytosis without anemia - CBC with Differential/Platelet  Breast cancer screening by mammogram Mammogram is ordered due 04/19/2021 Elevated glucose - Hemoglobin A1c   Return in about 1 year (around 11/01/2021) for not able to code cpe fyi, [facility] (30 min).    Orders Placed This Encounter  Procedures  . CBC with Differential/Platelet  . Comprehensive metabolic panel  . Hemoglobin A1c  . Lipid panel  . TSH  . Iron, TIBC and Ferritin Panel   Meds ordered this encounter  Medications  . Tdap (ADACEL) 01-06-14.5 LF-MCG/0.5 injection    Sig: Inject 0.5 mLs into the muscle once for 1 dose.    Dispense:  0.5 mL    Refill:  0   Referral Orders  No referral(s) requested today     Electronically signed by: Howard Pouch, [place]

## 2020-11-02 LAB — TSH: TSH: 1.64 mIU/L (ref 0.40–4.50)

## 2020-11-02 LAB — IRON,TIBC AND FERRITIN PANEL
%SAT: 54 % (calc) — ABNORMAL HIGH (ref 16–45)
Ferritin: 51 ng/mL (ref 16–288)
Iron: 168 ug/dL — ABNORMAL HIGH (ref 45–160)
TIBC: 314 mcg/dL (calc) (ref 250–450)

## 2020-11-02 LAB — CBC WITH DIFFERENTIAL/PLATELET
Absolute Monocytes: 490 cells/uL (ref 200–950)
Basophils Absolute: 7 cells/uL (ref 0–200)
Basophils Relative: 0.1 %
Eosinophils Absolute: 43 cells/uL (ref 15–500)
Eosinophils Relative: 0.6 %
HCT: 40.7 % (ref 35.0–45.0)
Hemoglobin: 14.3 g/dL (ref 11.7–15.5)
Lymphs Abs: 1349 cells/uL (ref 850–3900)
MCH: 34.8 pg — ABNORMAL HIGH (ref 27.0–33.0)
MCHC: 35.1 g/dL (ref 32.0–36.0)
MCV: 99 fL (ref 80.0–100.0)
MPV: 12.1 fL (ref 7.5–12.5)
Monocytes Relative: 6.9 %
Neutro Abs: 5211 cells/uL (ref 1500–7800)
Neutrophils Relative %: 73.4 %
Platelets: 230 10*3/uL (ref 140–400)
RBC: 4.11 10*6/uL (ref 3.80–5.10)
RDW: 11.9 % (ref 11.0–15.0)
Total Lymphocyte: 19 %
WBC: 7.1 10*3/uL (ref 3.8–10.8)

## 2020-11-02 LAB — LIPID PANEL
Cholesterol: 244 mg/dL — ABNORMAL HIGH (ref <200)
HDL: 88 mg/dL (ref >=50)
LDL Cholesterol (Calc): 136 mg/dL (calc) — ABNORMAL HIGH
Non-HDL Cholesterol (Calc): 156 mg/dL (calc) — ABNORMAL HIGH (ref <130)
Total CHOL/HDL Ratio: 2.8 (calc) (ref <5.0)
Triglycerides: 94 mg/dL (ref <150)

## 2020-11-02 LAB — COMPREHENSIVE METABOLIC PANEL
AG Ratio: 1.7 (calc) (ref 1.0–2.5)
ALT: 17 U/L (ref 6–29)
AST: 20 U/L (ref 10–35)
Albumin: 4.5 g/dL (ref 3.6–5.1)
Alkaline phosphatase (APISO): 67 U/L (ref 37–153)
BUN: 19 mg/dL (ref 7–25)
CO2: 26 mmol/L (ref 20–32)
Calcium: 9.9 mg/dL (ref 8.6–10.4)
Chloride: 99 mmol/L (ref 98–110)
Creat: 0.71 mg/dL (ref 0.60–0.93)
Globulin: 2.6 g/dL (calc) (ref 1.9–3.7)
Glucose, Bld: 84 mg/dL (ref 65–99)
Potassium: 4.4 mmol/L (ref 3.5–5.3)
Sodium: 134 mmol/L — ABNORMAL LOW (ref 135–146)
Total Bilirubin: 0.5 mg/dL (ref 0.2–1.2)
Total Protein: 7.1 g/dL (ref 6.1–8.1)

## 2020-11-02 LAB — HEMOGLOBIN A1C
Hgb A1c MFr Bld: 5.1 % of total Hgb (ref <5.7)
Mean Plasma Glucose: 100 mg/dL
eAG (mmol/L): 5.5 mmol/L

## 2020-11-04 ENCOUNTER — Telehealth: Payer: Self-pay | Admitting: Family Medicine

## 2020-11-04 MED ORDER — ROSUVASTATIN CALCIUM 10 MG PO TABS: 10.0000 mg | ORAL_TABLET | Freq: Every day | ORAL | 3 refills | Status: DC

## 2020-11-04 NOTE — Telephone Encounter (Signed)
Spoke with pt regarding labs and instructions.   

## 2020-11-04 NOTE — Telephone Encounter (Signed)
Please call patient Liver, kidney and thyroid function are normal Blood cell counts and electrolytes are stable.  Diabetes screening/A1c is normal  Her iron panel is just mildly elevated.  If she is taking a iron supplement I would encourage her to cut back on dosage or frequency of medication.  Her cholesterol panel is elevated with an LDL of 136.  This level of LDL in a 75 year old female with a family history of heart disease places her at approximately 16% risk of heart attack or stroke over the next 10 years-per American Heart Association criteria.  -Per this criteria, she is recommended to start a statin medication such as Crestor daily before bed.  This will help lower her cholesterol and also provide her work extra cardiovascular protection.  I have called this medicine in for her.  -Follow-up in 3 months on hyperlipidemia for cholesterol recheck.  Once stable we'll just follow yearly with his condition.  Of course, routine exercise and a heart healthy diet low in saturated fats are also helpful.

## 2020-11-26 ENCOUNTER — Encounter: Payer: Self-pay | Admitting: Family Medicine

## 2020-11-26 NOTE — Telephone Encounter (Signed)
Since she already has the Crestor medication, I would recommend she try half of a tab of Crestor, 2 times a week spaced apart such as Sunday and Thursday dosing after dinner.  Even in people who have the myalgias, it usually can be tolerated at lower doses spaced apart.  If she can tolerate it, this medication would provide her the cardiovascular protection even at that lower dose.  If myalgias occur again give [place] a call back and we can try to switch to a different medication class, which would not provide the extra cardiovascular protection but help her lower her cholesterol.

## 2020-12-02 DIAGNOSIS — M1711 Unilateral primary osteoarthritis, right knee: Secondary | ICD-10-CM | POA: Diagnosis not present

## 2020-12-02 DIAGNOSIS — M25561 Pain in right knee: Secondary | ICD-10-CM | POA: Diagnosis not present

## 2020-12-02 DIAGNOSIS — M67912 Unspecified disorder of synovium and tendon, left shoulder: Secondary | ICD-10-CM | POA: Diagnosis not present

## 2020-12-02 DIAGNOSIS — M25512 Pain in left shoulder: Secondary | ICD-10-CM | POA: Diagnosis not present

## 2021-01-27 DIAGNOSIS — M4722 Other spondylosis with radiculopathy, cervical region: Secondary | ICD-10-CM | POA: Diagnosis not present

## 2021-01-27 DIAGNOSIS — M25512 Pain in left shoulder: Secondary | ICD-10-CM | POA: Diagnosis not present

## 2021-01-27 DIAGNOSIS — M542 Cervicalgia: Secondary | ICD-10-CM | POA: Diagnosis not present

## 2021-02-05 ENCOUNTER — Encounter: Payer: Self-pay | Admitting: Family Medicine

## 2021-02-05 ENCOUNTER — Ambulatory Visit (INDEPENDENT_AMBULATORY_CARE_PROVIDER_SITE_OTHER): Payer: Medicare Other | Admitting: Family Medicine

## 2021-02-05 ENCOUNTER — Other Ambulatory Visit: Payer: Self-pay

## 2021-02-05 VITALS — BP 96/67 | HR 88 | Temp 98.0°F | Ht 61.0 in | Wt 128.0 lb

## 2021-02-05 DIAGNOSIS — E7800 Pure hypercholesterolemia, unspecified: Secondary | ICD-10-CM | POA: Diagnosis not present

## 2021-02-05 NOTE — Progress Notes (Signed)
This visit occurred during the SARS-CoV-2 public health emergency.  Safety protocols were in place, including screening questions prior to the visit, additional usage of staff PPE, and extensive cleaning of exam room while observing appropriate contact time as indicated for disinfecting solutions.    Patient ID: Sara Doyle, female  DOB: 1946-08-28, 75 y.o.   MRN: [id number] Patient Care Team    Relationship Specialty Notifications Start End  Ma Hillock, DO PCP - General Family Medicine  05/10/15   Milus Banister, MD Attending Physician Gastroenterology  10/05/16   Rutherford Guys, MD Consulting Physician Ophthalmology  10/05/16   Sallyanne Kuster  Dentistry  10/05/16   Dickie La, MD Consulting Physician Family Medicine  10/11/17    Comment: (ortho)    Chief Complaint  Patient presents with  . Hyperlipidemia    Pt is not fasting    Subjective:  Sara Doyle is a 75 y.o.  Female  present for [facility] All past medical history, surgical history, allergies, family history, immunizations, medications and social history were updated in the electronic medical record today. All recent labs, ED visits and hospitalizations within the last year were reviewed.  Osteopenia, unspecified location dexa ordered and pt taking vit d supplement.   Elevated cholesterol/fhx heart disease in bother.  Has been diet controlled the past.  However her last LDL did increase to put her at a mild elevation in cardiac wrist over normal population.  She attempted to start low-dose Crestor 5 mg every other day and was unable to tolerate secondary to myalgias.  She is here for follow-up today.    Depression screen [facility] 2/9 11/01/2020 10/30/2020 10/23/2020 10/20/2019 10/14/2018  Decreased Interest 0 0 0 0 0  Down, Depressed, Hopeless 0 0 0 0 0  PHQ - 2 Score 0 0 0 0 0   No flowsheet data found.   Immunization History  Administered Date(s) Administered  . Influenza, High Dose Seasonal PF 07/04/2018, 06/17/2019   . Influenza-Unspecified 06/23/2015, 06/21/2016, 06/07/2017, 07/24/2020  . PFIZER(Purple Top)SARS-COV-2 Vaccination 09/27/2019, 10/16/2019, 07/02/2020  . Pneumococcal Conjugate-13 02/12/2014  . Pneumococcal Polysaccharide-23 01/06/2011  . Tdap 01/06/2003    Past Medical History:  Diagnosis Date  . Allergy    Allergies  Allergen Reactions  . 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 Demerol [Meperidine] Nausea And Vomiting   Past Surgical History:  Procedure Laterality Date  . BUNIONECTOMY Right   . FRACTURE SURGERY  1969   right leg  . FRACTURE SURGERY  2005   right leg  . SKIN LESION EXCISION Right 07/08/2017   Behind right knee, benign  . 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 TUBAL LIGATION     Family History  Problem Relation Age of Onset  . Hypertension Mother   . Arthritis Father        osteo  . Heart disease Brother        congenital  . Breast cancer Sister 63       breast  . Arthritis Sister   . Hypertension Sister   . Heart disease Brother   . Cancer Brother        prostate, colon  . Arrhythmia Brother        a-fib  .  Heart disease Brother   . Colon cancer Neg Hx   . Esophageal cancer Neg Hx   . Rectal cancer Neg Hx   . Stomach cancer Neg Hx    Social History   Social History Narrative   Sara Doyle lives with her husband, She relocated from [place] Nov., 2015. She is retired, has 3 grown sons that live in [place].   Smoke alarm in the home, wears her seatbelt, uses sunscreen.   Ambulates independently. No dentures.   Has dentist with regular appts.    Feels safe in her relationships    Allergies as of 02/05/2021      Reactions   Oysters [shellfish Allergy] Anaphylaxis, Swelling    Tongue swelling/difficulty breathing   Demerol [meperidine] Nausea And Vomiting      Medication List       Accurate as of February 05, 2021 11:59 PM. If you have any questions, ask your nurse or doctor.        STOP taking these medications   rosuvastatin 10 MG tablet Commonly known as: Crestor Stopped by: Howard Pouch, DO     TAKE these medications   b complex vitamins tablet Take 1 tablet by mouth daily.   Glucosamine HCl 1500 MG Tabs Take by mouth.   OVER THE COUNTER MEDICATION Areds preservision one tablet daily.   Oyster Shell Calcium/D 250-125 MG-UNIT Tabs Take by mouth.   predniSONE 10 MG (21) Tbpk tablet Commonly known as: STERAPRED UNI-PAK 21 TAB See admin instructions. follow package directions       All past medical history, surgical history, allergies, family history, immunizations andmedications were updated in the EMR today and reviewed under the history and medication portions of their EMR.     No results found for this or any previous visit (from the past 2160 hour(s)).  ROS: 14 pt review of systems performed and negative (unless mentioned in an HPI)  Objective: BP 96/67   Pulse 88   Temp 98 F (36.7 C) (Oral)   Ht 5\' 1"  (1.549 m)   Wt 128 lb (58.1 kg)   SpO2 96%   BMI 24.19 kg/m  Gen: Afebrile. No acute distress.  HENT: AT. [place]. Eyes:Pupils Equal Round Reactive to light, Extraocular movements intact,  Conjunctiva without redness, discharge or icterus. CV: RRR no murmur, no edema Chest: CTAB Neuro:  Normal gait. PERLA. EOMi. Alert. Oriented x3   No exam data present  Assessment/plan: Sara Doyle is a 75 y.o. female present for [facility] [place] Osteopenia, unspecified location Bone density is ordered due 04/19/2021 Continue vit d supplement.   Hyperlipidemia: Unable to tolerate even low-dose every other day Crestor. Lengthy discussion today surrounding increasing exercise greater than 150 minutes week of cardiovascular exercise with optimal heart  rate during exercise. Discussed dietary modifications in detail today limiting butters-may replace with plant-based butters.   Using all of or canola oil only, which she already has been doing. Limiting red meat consumption to 1 times a week or less. Avoiding fatty preparations of meat.  Removing skin from meat. More fresh fruits and vegetables, added fiber. Do not feel she needs to restart statin at this time or try different medication.  Overall her cholesterol panel was pretty good.  We will just adding on a statin to provide her with extra cardiovascular protection for her family history.    Return in about 6 months (around 08/07/2021) for [place] (30 min)- hyperlipidemia.    No orders of the defined types were placed  in this encounter.  No orders of the defined types were placed in this encounter.  Referral Orders  No referral(s) requested today     Electronically signed by: Howard Pouch, [place]

## 2021-02-05 NOTE — Patient Instructions (Signed)
Preventing High Cholesterol Cholesterol is a white, waxy substance similar to fat that the human body needs to help build cells. The liver makes all the cholesterol that a person's body needs. Having high cholesterol (hypercholesterolemia) increases your risk for heart disease and stroke. Extra or excess cholesterol comes from the food that you eat. High cholesterol can often be prevented with diet and lifestyle changes. If you already have high cholesterol, you can control it with diet, lifestyle changes, and medicines. How can high cholesterol affect me? If you have high cholesterol, fatty deposits (plaques) may build up on the walls of your blood vessels. The blood vessels that carry blood away from your heart are called arteries. Plaques make the arteries narrower and stiffer. This in turn can:  Restrict or block blood flow and cause blood clots to form.  Increase your risk for heart attack and stroke. What can increase my risk for high cholesterol? This condition is more likely to develop in people who:  Eat foods that are high in saturated fat or cholesterol. Saturated fat is mostly found in foods that come from animal sources.  Are overweight.  Are not getting enough exercise.  Have a family history of high cholesterol (familial hypercholesterolemia). What actions can I take to prevent this? Nutrition  Eat less saturated fat.  Avoid trans fats (partially hydrogenated oils). These are often found in margarine and in some baked goods, fried foods, and snacks bought in packages.  Avoid precooked or cured meat, such as bacon, sausages, or meat loaves.  Avoid foods and drinks that have added sugars.  Eat more fruits, vegetables, and whole grains.  Choose healthy sources of protein, such as fish, poultry, lean cuts of red meat, beans, peas, lentils, and nuts.  Choose healthy sources of fat, such as: ? Nuts. ? Vegetable oils, especially olive oil. ? Fish that have healthy fats,  such as omega-3 fatty acids. These fish include mackerel or salmon.   Lifestyle  Lose weight if you are overweight. Maintaining a healthy body mass index (BMI) can help prevent or control high cholesterol. It can also lower your risk for diabetes and high blood pressure. Ask your health care provider to help you with a diet and exercise plan to lose weight safely.  Do not use any products that contain nicotine or tobacco, such as cigarettes, e-cigarettes, and chewing tobacco. If you need help quitting, ask your health care provider. Alcohol use  Do not drink alcohol if: ? Your health care provider tells you not to drink. ? You are pregnant, may be pregnant, or are planning to become pregnant.  If you drink alcohol: ? Limit how much you use to:  0-1 drink a day for women.  0-2 drinks a day for men. ? Be aware of how much alcohol is in your drink. In the [place] one drink equals one 12 oz bottle of beer (355 mL), one 5 oz glass of wine (148 mL), or one 1 oz glass of hard liquor (44 mL). Activity  Get enough exercise. Do exercises as told by your health care provider.  Each week, do at least 150 minutes of exercise that takes a medium level of effort (moderate-intensity exercise). This kind of exercise: ? Makes your heart beat faster while allowing you to still be able to talk. ? Can be done in short sessions several times a day or longer sessions a few times a week. For example, on 5 days each week, you could walk fast or ride   your bike 3 times a day for 10 minutes each time.   Medicines  Your health care provider may recommend medicines to help lower cholesterol. This may be a medicine to lower the amount of cholesterol that your liver makes. You may need medicine if: ? Diet and lifestyle changes have not lowered your cholesterol enough. ? You have high cholesterol and other risk factors for heart disease or stroke.  Take over-the-counter and prescription medicines only as told by your  health care provider. General information  Manage your risk factors for high cholesterol. Talk with your health care provider about all your risk factors and how to lower your risk.  Manage other conditions that you have, such as diabetes or high blood pressure (hypertension).  Have blood tests to check your cholesterol levels at regular points in time as told by your health care provider.  Keep all follow-up visits as told by your health care provider. This is important. Where to find more information  American Heart Association: www.heart.org  National Heart, Lung, and Blood Institute: www.nhlbi.nih.gov Summary  High cholesterol increases your risk for heart disease and stroke. By keeping your cholesterol level low, you can reduce your risk for these conditions.  High cholesterol can often be prevented with diet and lifestyle changes.  Work with your health care provider to manage your risk factors, and have your blood tested regularly. This information is not intended to replace advice given to you by your health care provider. Make sure you discuss any questions you have with your health care provider. Document Revised: 06/06/2019 Document Reviewed: 06/06/2019 Elsevier Patient Education  2021 Elsevier Inc.  

## 2021-02-17 DIAGNOSIS — M25552 Pain in left hip: Secondary | ICD-10-CM | POA: Diagnosis not present

## 2021-02-17 DIAGNOSIS — M7062 Trochanteric bursitis, left hip: Secondary | ICD-10-CM | POA: Diagnosis not present

## 2021-02-20 ENCOUNTER — Other Ambulatory Visit: Payer: Self-pay

## 2021-02-20 ENCOUNTER — Ambulatory Visit (INDEPENDENT_AMBULATORY_CARE_PROVIDER_SITE_OTHER): Payer: Medicare Other | Admitting: Family Medicine

## 2021-02-20 ENCOUNTER — Encounter: Payer: Self-pay | Admitting: Family Medicine

## 2021-02-20 VITALS — BP 132/83 | HR 75 | Temp 98.2°F | Ht 61.0 in | Wt 128.0 lb

## 2021-02-20 DIAGNOSIS — D7589 Other specified diseases of blood and blood-forming organs: Secondary | ICD-10-CM | POA: Diagnosis not present

## 2021-02-20 DIAGNOSIS — Z96641 Presence of right artificial hip joint: Secondary | ICD-10-CM | POA: Diagnosis not present

## 2021-02-20 DIAGNOSIS — Z01818 Encounter for other preprocedural examination: Secondary | ICD-10-CM

## 2021-02-20 DIAGNOSIS — M1611 Unilateral primary osteoarthritis, right hip: Secondary | ICD-10-CM

## 2021-02-20 DIAGNOSIS — Z79899 Other long term (current) drug therapy: Secondary | ICD-10-CM

## 2021-02-20 DIAGNOSIS — L719 Rosacea, unspecified: Secondary | ICD-10-CM | POA: Insufficient documentation

## 2021-02-20 NOTE — Progress Notes (Signed)
This visit occurred during the SARS-CoV-2 public health emergency.  Safety protocols were in place, including screening questions prior to the visit, additional usage of staff PPE, and extensive cleaning of exam room while observing appropriate contact time as indicated for disinfecting solutions.    Sara Doyle , [birth year], 75 y.o., female MRN: [id number] Patient Care Team    Relationship Specialty Notifications Start End  Ma Hillock, DO PCP - General Family Medicine  05/10/15   Milus Banister, MD Attending Physician Gastroenterology  10/05/16   Rutherford Guys, MD Consulting Physician Ophthalmology  10/05/16   Sallyanne Kuster  Dentistry  10/05/16   Dickie La, MD Consulting Physician Family Medicine  10/11/17    Comment: (ortho)    Chief Complaint  Patient presents with   Surgical Clearance    What type of surgery is being performed? L Hip Replacement  What type of anesthesia? Spinal  When is this surgery scheduled? 03/19/21  Name of physician performing surgery? Dr. [name]           Subjective: Pt presents for an OV for surgical clearance.  Procedure:Left total hip arthroplasty- Dr. [name]. 03/19/2021. Indication:osteoarthritis  Anesthesia:? Spinal- LMAC Surgery type risk:intermediate risk= orthopedics Prior anesthesia complications:N/A Family history of prior anesthesia complications:N/A Cardiac:    - CBD, PAD, stroke, MI, aortic stenosis > pt denies   - METs: > 4 METS Pulmonary: COPD, smoker, asthma, sleep apnea, dyspnea> pt denies Endocrine: a1c and fasting sugars normal.  Obesity:Body mass index is 24.19 kg/m. Chronic kidney disease: no prior history Chronic med that needs to be continued:no Anticoagulation: no  Depression screen [facility] 2/9 11/01/2020 10/30/2020 10/23/2020 10/20/2019 10/14/2018  Decreased Interest 0 0 0 0 0  Down, Depressed, Hopeless 0 0 0 0 0  PHQ - 2 Score 0 0 0 0 0    Allergies  Allergen Reactions   Oysters [Shellfish Allergy]  Anaphylaxis and Swelling    Tongue swelling/difficulty breathing   Demerol [Meperidine] Nausea And Vomiting   Social History   Social History Narrative   Sara Doyle lives with her husband, She relocated from [place] Nov., 2015. She is retired, has 3 grown sons that live in [place].   Smoke alarm in the home, wears her seatbelt, uses sunscreen.   Ambulates independently. No dentures.   Has dentist with regular appts.    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, '05   [place] Right 05/20/2018   Procedure: RIGHT TOTAL HIP ARTHROPLASTY ANTERIOR APPROACH;  Surgeon: [name], MD;  Location: [facility];  Service: Orthopedics;  Laterality: Right;   TUBAL LIGATION     Family History  Problem Relation Age of Onset   Hypertension Mother    Arthritis Father        osteo   Heart disease Brother        congenital   Breast cancer Sister 34       breast  Arthritis Sister    Hypertension Sister    Heart disease Brother    Cancer Brother        prostate, colon   Arrhythmia Brother        a-fib   Heart disease Brother    Colon cancer Neg Hx    Esophageal cancer Neg Hx    Rectal cancer Neg Hx    Stomach cancer Neg Hx    Allergies as of 02/20/2021       Reactions   Oysters [shellfish Allergy] Anaphylaxis, Swelling   Tongue swelling/difficulty breathing   Demerol [meperidine] Nausea And Vomiting        Medication List        Accurate as of February 20, 2021 11:59 PM. If you have any questions, ask your nurse or doctor.           STOP taking these medications    Glucosamine HCl 1500 MG Tabs Stopped by: Howard Pouch, DO   predniSONE 10 MG (21) Tbpk tablet Commonly known as: STERAPRED UNI-PAK 21 TAB Stopped by: Howard Pouch, DO       TAKE these medications    b complex vitamins tablet Take 1 tablet by mouth daily.   OVER THE COUNTER MEDICATION Areds preservision one tablet daily.   Oyster Shell Calcium/D 250-125 MG-UNIT Tabs Take by mouth.        All past medical history, surgical history, allergies, family history, immunizations andmedications were updated in the EMR today and reviewed under the history and medication portions of their EMR.     ROS: Negative, with the exception of above mentioned in HPI   Objective:  BP 132/83   Pulse 75   Temp 98.2 F (36.8 C) (Oral)   Ht 5' 1"  (1.549 m)   Wt 128 lb (58.1 kg)   SpO2 96%   BMI 24.19 kg/m  Body mass index is 24.19 kg/m. Gen: Afebrile. No acute distress. Nontoxic in appearance, well developed, well nourished.  HENT: AT. [place]. Bilateral TM visualized w/o erythema or bulging. MMM, no oral lesions. Bilateral nares without erythema or drainage. Throat without erythema or exudates. No cough or hoarseness.  Eyes:Pupils Equal Round Reactive to light, Extraocular movements intact,  Conjunctiva without redness, discharge or icterus. Neck/lymp/endocrine: Supple,no lymphadenopathy, no thyromegaly CV: RRR no murmur, no edema Chest: CTAB, no wheeze or crackles. Good air movement, normal resp effort.  Abd: Soft. NTND. BS present Skin: no rashes, purpura or petechiae.  Neuro: Normal gait. PERLA. EOMi. Alert. Oriented x3  Psych: Normal affect, dress and demeanor. Normal speech. Normal thought content and judgment.  No results found. No results found. No results found for this or any previous visit (from the past 24 hour(s)).  Assessment/Plan: Sara Doyle is a 75 y.o. female present for OV for surgical clearance Pre-operative clearance Patient  is likely of low risk for surgical complications secondary to medical conditions. Patient is not anemic or diabetic. Bleeding times are normal. 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  Encounter for long-term (current) use of medications - Comp Met (CMET)> within normal limits Macrocytosis without anemia - CBC w/Diff> abnormal/high hemoglobin hematocrit> repeat within normal limits. Iron levels are increased and patient is being sent to hematology for elevated iron.   No patient is free of risk when undergoing a procedure. The decision about whether to proceed with the operation belongs to the surgeon and the patient.   Reviewed expectations re: course  of current medical issues. Discussed self-management of symptoms. Outlined signs and symptoms indicating need for more acute intervention. Patient verbalized understanding and all questions were answered. Patient received an After-Visit Summary.    Orders Placed This Encounter  Procedures   CBC w/Diff   Comp Met (CMET)   No orders of the defined types were placed in this encounter.  Referral Orders  No referral(s) requested today    > 25 Minutes was dedicated to this patient's encounter to include pre-visit review of chart, face-to-face time with patient and post-visit work- which include documentation and prescribing medications and/or ordering test when necessary.     Note is dictated utilizing voice recognition software. Although note has been proof read prior to signing, occasional typographical errors still can be missed. If any questions arise, please do not hesitate to call for verification.   electronically signed by:  Howard Pouch, DO  [place]

## 2021-02-20 NOTE — Patient Instructions (Signed)
Good luck on your Surgery.

## 2021-02-21 ENCOUNTER — Telehealth: Payer: Self-pay | Admitting: Family Medicine

## 2021-02-21 ENCOUNTER — Ambulatory Visit: Payer: Medicare Other

## 2021-02-21 DIAGNOSIS — D582 Other hemoglobinopathies: Secondary | ICD-10-CM

## 2021-02-21 LAB — COMPREHENSIVE METABOLIC PANEL
AG Ratio: 1.6 (calc) (ref 1.0–2.5)
ALT: 14 U/L (ref 6–29)
AST: 17 U/L (ref 10–35)
Albumin: 4.4 g/dL (ref 3.6–5.1)
Alkaline phosphatase (APISO): 66 U/L (ref 37–153)
BUN: 16 mg/dL (ref 7–25)
CO2: 30 mmol/L (ref 20–32)
Calcium: 9.8 mg/dL (ref 8.6–10.4)
Chloride: 97 mmol/L — ABNORMAL LOW (ref 98–110)
Creat: 0.62 mg/dL (ref 0.60–0.93)
Globulin: 2.7 g/dL (calc) (ref 1.9–3.7)
Glucose, Bld: 82 mg/dL (ref 65–99)
Potassium: 4.6 mmol/L (ref 3.5–5.3)
Sodium: 133 mmol/L — ABNORMAL LOW (ref 135–146)
Total Bilirubin: 0.6 mg/dL (ref 0.2–1.2)
Total Protein: 7.1 g/dL (ref 6.1–8.1)

## 2021-02-21 LAB — CBC WITH DIFFERENTIAL/PLATELET
Absolute Monocytes: 487 cells/uL (ref 200–950)
Basophils Absolute: 52 cells/uL (ref 0–200)
Basophils Relative: 0.9 %
Eosinophils Absolute: 81 cells/uL (ref 15–500)
Eosinophils Relative: 1.4 %
HCT: 58.3 % — ABNORMAL HIGH (ref 35.0–45.0)
Hemoglobin: 19.3 g/dL — ABNORMAL HIGH (ref 11.7–15.5)
Lymphs Abs: 1815 cells/uL (ref 850–3900)
MCH: 34 pg — ABNORMAL HIGH (ref 27.0–33.0)
MCHC: 33.1 g/dL (ref 32.0–36.0)
MCV: 102.6 fL — ABNORMAL HIGH (ref 80.0–100.0)
MPV: 11.6 fL (ref 7.5–12.5)
Monocytes Relative: 8.4 %
Neutro Abs: 3364 cells/uL (ref 1500–7800)
Neutrophils Relative %: 58 %
Platelets: 298 10*3/uL (ref 140–400)
RBC: 5.68 10*6/uL — ABNORMAL HIGH (ref 3.80–5.10)
RDW: 13 % (ref 11.0–15.0)
Total Lymphocyte: 31.3 %
WBC: 5.8 10*3/uL (ref 3.8–10.8)

## 2021-02-21 NOTE — Telephone Encounter (Signed)
Please call patient: Her liver, kidney function and electrolytes are stable. Her blood cell counts are drastically different than past collections.  -Her red blood cells, hemoglobin, hematocrit are all elevated and the size of her red blood cell has increased.  Means her blood is thicker than it should be.    We tested her iron back in February it was just mildly elevated and she was told to stop any iron supplementation she may have been taking.  Please make sure she is not taking any iron by vitamins or multivitamin.  Have her stop the oyster shell calcium supplement she has been taking.  Hydrate very well, and please place her on the lab appointment for Monday to repeat the labs to ensure they are accurate.  I have also ordered a carboxyhemoglobin and iron panel.  If this is accurate we need to get her to a blood specialist, called a hematologist, as soon as possible prior to her surgery.  I have went ahead and place that referral for her to get it started in the system.

## 2021-02-21 NOTE — Telephone Encounter (Signed)
Bethena Roys with Dr. Berenice Primas' office regarding surgery clearance form.  She said she needs to speak with someone regarding form and lab orders that were attached to order.  Judy @ Dr. Berenice Primas [phone]

## 2021-02-21 NOTE — Telephone Encounter (Signed)
Spoke with pt regarding labs and instructions.   

## 2021-02-21 NOTE — Telephone Encounter (Signed)
Spoke with Sara Doyle and informed her that we will be have pt returning to office Monday for additional labs

## 2021-02-24 ENCOUNTER — Ambulatory Visit (INDEPENDENT_AMBULATORY_CARE_PROVIDER_SITE_OTHER): Payer: Medicare Other

## 2021-02-24 ENCOUNTER — Other Ambulatory Visit: Payer: Self-pay

## 2021-02-24 DIAGNOSIS — D582 Other hemoglobinopathies: Secondary | ICD-10-CM

## 2021-02-24 DIAGNOSIS — Z01818 Encounter for other preprocedural examination: Secondary | ICD-10-CM | POA: Diagnosis not present

## 2021-02-24 LAB — PROTIME-INR
INR: 1 ratio (ref 0.8–1.0)
Prothrombin Time: 10.7 s (ref 9.6–13.1)

## 2021-02-25 ENCOUNTER — Inpatient Hospital Stay: Payer: Medicare Other | Attending: Physician Assistant | Admitting: Physician Assistant

## 2021-02-25 ENCOUNTER — Encounter: Payer: Self-pay | Admitting: Physician Assistant

## 2021-02-25 ENCOUNTER — Telehealth: Payer: Self-pay | Admitting: Family Medicine

## 2021-02-25 ENCOUNTER — Inpatient Hospital Stay: Payer: Medicare Other

## 2021-02-25 VITALS — BP 114/71 | HR 84 | Temp 97.0°F | Resp 19 | Ht 61.0 in | Wt 130.7 lb

## 2021-02-25 DIAGNOSIS — Z87891 Personal history of nicotine dependence: Secondary | ICD-10-CM | POA: Diagnosis not present

## 2021-02-25 DIAGNOSIS — R790 Abnormal level of blood mineral: Secondary | ICD-10-CM

## 2021-02-25 DIAGNOSIS — Z803 Family history of malignant neoplasm of breast: Secondary | ICD-10-CM

## 2021-02-25 DIAGNOSIS — D751 Secondary polycythemia: Secondary | ICD-10-CM

## 2021-02-25 DIAGNOSIS — Z800 Family history of malignant neoplasm of digestive organs: Secondary | ICD-10-CM

## 2021-02-25 DIAGNOSIS — Z8042 Family history of malignant neoplasm of prostate: Secondary | ICD-10-CM

## 2021-02-25 LAB — RETIC PANEL
Immature Retic Fract: 13.9 % (ref 2.3–15.9)
RBC.: 3.72 MIL/uL — ABNORMAL LOW (ref 3.87–5.11)
Retic Count, Absolute: 74.2 10*3/uL (ref 19.0–186.0)
Retic Ct Pct: 2 % (ref 0.4–3.1)
Reticulocyte Hemoglobin: 37.8 pg (ref >27.9)

## 2021-02-25 NOTE — Telephone Encounter (Signed)
Spoke with pt regarding labs and instructions.   

## 2021-02-25 NOTE — Telephone Encounter (Signed)
Repeat CBC is in normal range now. Her pt/inr also normal.I have completed her clearance forms. Please attach lab results.    Her iron levels are high and her saturations are high.    - She should still be evaluated by the bld specialist we referred her to and stop the "oyster shell" calcium for now.   If desired she can supplement with other OTC calcium, but avoid the oyster shell type for now. Some studies have shown this can increase iron.

## 2021-02-25 NOTE — Progress Notes (Signed)
[place] [phone]   Fax:([phone]) [place] NOTE  Patient Care Team: Ma Hillock, DO as PCP - General (Family Medicine) Milus Banister, MD as Attending Physician (Gastroenterology) Rutherford Guys, MD as Consulting Physician (Ophthalmology) Sallyanne Kuster (Dentistry) Dickie La, MD as Consulting Physician (Family Medicine)  Hematological/Oncological History 1) Labs from PCP, Dr. Howard Pouch -02/20/2021: WBC 5.8, Hgb 19.3, MCV 102.6, Plt 298 -02/24/2021: WBC 4.4, Hgb 14.1, MCV 99.8, Plt 234, Iron 204, TIBC 322, Iron Saturation 63%,  Ferritin 52.   2) 02/25/2021: Establish care with Sara Query PA-C  CHIEF COMPLAINTS/PURPOSE OF CONSULTATION:  "Polycythemia "  HISTORY OF PRESENTING ILLNESS:  Sara Doyle 75 y.o. female who presents to the clinic for evaluation for polycythemia. Patient is accompanied by her husband for this visit. [name] reports that she is feeling well without any changes to her energy or appetite. She stays active and exercises regularly. Patient denies any nausea, vomiting or abdominal pain. Her bowel movements are regular without any diarrhea or constipation. She denies easy bruising or signs of bleeding. Patient denies fevers, chills, night sweats, shortness of breath, chest pain or cough. She has no other complaints.   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   Procedure: RIGHT TOTAL HIP ARTHROPLASTY ANTERIOR APPROACH;  Surgeon: [name],  MD;  Location: [facility];  Service: Orthopedics;  Laterality: Right;   TUBAL LIGATION      SOCIAL HISTORY: Social History   Socioeconomic History   Marital status: Married    Spouse name: Not on file   Number of children: 3   Years of education: Not on file   Highest education level: Not on file  Occupational History    Comment: retired  Tobacco Use   Smoking status: Former    Packs/day: 1.00    Years: 25.00    Pack years: 25.00    Types: Cigarettes    Quit date: 09/07/1993    Years since quitting: 27.4   Smokeless tobacco: Never  Vaping Use   Vaping Use: Never used  Substance and Sexual Activity   Alcohol use: Yes    Alcohol/week: 4.0 standard drinks    Types: 4 Glasses of wine per week    Comment: 4-5 glasses/week   Drug use: No   Sexual activity: Yes    Birth control/protection: Post-menopausal  Other Topics Concern   Not on file  Social History Narrative   [name] lives with her husband, She relocated from [place] Nov., 2015. She is retired, has 3 grown sons that live in [place].   Smoke alarm in the home, wears her seatbelt, uses sunscreen.   Ambulates independently. No dentures.   Has dentist with regular appts.    Feels safe in her relationships   Social Determinants of Health   Financial Resource Strain: Low Risk    Difficulty of Paying Living Expenses: Not hard at all  Food Insecurity: No  Food Insecurity   Worried About Charity fundraiser in the Last Year: Never true   Ran Out of Food in the Last Year: Never true  Transportation Needs: No Transportation Needs   Lack of Transportation (Medical): No   Lack of Transportation (Non-Medical): No  Physical Activity: Sufficiently Active   Days of Exercise per Week: 3 days   Minutes of Exercise per Session: 60 min  Stress: No Stress Concern Present   Feeling of Stress : Not at all  Social Connections: Moderately Integrated   Frequency of Communication with Friends and Family: More than three times a week    Frequency of Social Gatherings with Friends and Family: Once a week   Attends Religious Services: Never   Marine scientist or Organizations: Yes   Attends Archivist Meetings: Never   Marital Status: Married  Human resources officer Violence: Not At Risk   Fear of Current or Ex-Partner: No   Emotionally Abused: No   Physically Abused: No   Sexually Abused: No    FAMILY HISTORY: Family History  Problem Relation Age of Onset   Hypertension Mother    Arthritis Father        osteo   Heart disease Brother        congenital   Breast cancer Sister 57       breast   Arthritis Sister    Hypertension Sister    Heart disease Brother    Cancer Brother        prostate, colon   Arrhythmia Brother        a-fib   Heart disease Brother    Colon cancer Neg Hx    Esophageal cancer Neg Hx    Rectal cancer Neg Hx    Stomach cancer Neg Hx     ALLERGIES:  is allergic to [employer] allergy] and demerol [meperidine].  MEDICATIONS:  Current Outpatient Medications  Medication Sig Dispense Refill   b complex vitamins tablet Take 1 tablet by mouth daily.     Calcium Carbonate-Vitamin D (OYSTER SHELL CALCIUM/D) 250-125 MG-UNIT TABS Take by mouth.     OVER THE COUNTER MEDICATION Areds preservision one tablet daily.     No current facility-administered medications for this visit.    REVIEW OF SYSTEMS:   Constitutional: ( - ) fevers, ( - )  chills , ( - ) night sweats Eyes: ( - ) blurriness of vision, ( - ) double vision, ( - ) watery eyes Ears, nose, mouth, throat, and face: ( - ) mucositis, ( - ) sore throat Respiratory: ( - ) cough, ( - ) dyspnea, ( - ) wheezes Cardiovascular: ( - ) palpitation, ( - ) chest discomfort, ( - ) lower extremity swelling Gastrointestinal:  ( - ) nausea, ( - ) heartburn, ( - ) change in bowel habits Skin: ( - ) abnormal skin rashes Lymphatics: ( - ) new lymphadenopathy, ( - ) easy bruising Neurological: ( - ) numbness, ( - ) tingling, ( - ) new  weaknesses Behavioral/Psych: ( - ) mood change, ( - ) new changes  All other systems were reviewed with the patient and are negative.  PHYSICAL EXAMINATION: ECOG PERFORMANCE STATUS: 0 - Asymptomatic  Vitals:   02/25/21 1400  BP: 114/71  Pulse: 84  Resp: 19  Temp: (!) 97 F (36.1 C)  SpO2: 97%   Filed Weights   02/25/21 1400  Weight: 130 lb 11.2 oz (59.3 kg)    GENERAL: well appearing  female in NAD  SKIN: skin color, texture, turgor are normal, no rashes or significant lesions EYES: conjunctiva are pink and non-injected, sclera clear OROPHARYNX: no exudate, no erythema; lips, buccal mucosa, and tongue normal  NECK: supple, non-tender LYMPH:  no palpable lymphadenopathy in the cervical, axillary or supraclavicular lymph nodes.  LUNGS: clear to auscultation and percussion with normal breathing effort HEART: regular rate & rhythm and no murmurs and no lower extremity edema ABDOMEN: soft, non-tender, non-distended, normal bowel sounds Musculoskeletal: no cyanosis of digits and no clubbing  PSYCH: alert & oriented x 3, fluent speech NEURO: no focal motor/sensory deficits  LABORATORY DATA:  I have reviewed the data as listed CBC Latest Ref Rng & Units 02/24/2021 02/20/2021 11/01/2020  WBC 3.8 - 10.8 Thousand/uL 4.4 5.8 7.1  Hemoglobin 11.7 - 15.5 g/dL 14.1 19.3(H) 14.3  Hematocrit 35.0 - 45.0 % 41.6 58.3(H) 40.7  Platelets 140 - 400 Thousand/uL 234 298 230    CMP Latest Ref Rng & Units 02/20/2021 11/01/2020 10/20/2019  Glucose 65 - 99 mg/dL 82 84 91  BUN 7 - 25 mg/dL 16 19 12   Creatinine 0.60 - 0.93 mg/dL 0.62 0.71 0.59  Sodium 135 - 146 mmol/L 133(L) 134(L) 133(L)  Potassium 3.5 - 5.3 mmol/L 4.6 4.4 4.9  Chloride 98 - 110 mmol/L 97(L) 99 96  CO2 20 - 32 mmol/L 30 26 30   Calcium 8.6 - 10.4 mg/dL 9.8 9.9 9.5  Total Protein 6.1 - 8.1 g/dL 7.1 7.1 7.2  Total Bilirubin 0.2 - 1.2 mg/dL 0.6 0.5 0.4  Alkaline Phos 39 - 117 U/L - - 65  AST 10 - 35 U/L 17 20 15   ALT 6 - 29 U/L 14 17  10       ASSESSMENT & PLAN Sara Doyle is a 74 y.o. female who presents to the clinic for polycythemia.   #Polycythemia: --Potentially lab error versus medication induced as Hgb normalized from 19.3 on 02/20/2021 to 14.1 on 02/24/21.  --Patient denies smoking, signs or symptoms of OSA or recent travel to high altitude areas.  --Will proceed with labs to further evaluation including retic panel, erythropoietin level, MPN panel and BCR/ABL --RTC if above workup requires intervention.   #Elevated Iron Levels: --Reviewed iron panel from 02/24/2021 that shows elevated iron level and iron saturation.  --Not concerned for iron overload as ferritin and hemoglobin levels are within normal limits. --Monitor for now.   Orders Placed This Encounter  Procedures   Erythropoietin    Standing Status:   Future    Number of Occurrences:   1    Standing Expiration Date:   02/25/2022   JAK2 (INCLUDING V617F AND EXON 12), MPL,& CALR W/RFL MPN PANEL (NGS)    Standing Status:   Future    Number of Occurrences:   1    Standing Expiration Date:   02/25/2022   BCR ABL1 FISH ([facility])    Standing Status:   Future    Number of Occurrences:   1    Standing Expiration Date:   02/25/2022   Retic Panel    Standing Status:   Future    Number of Occurrences:   1    Standing Expiration Date:   02/25/2022    All questions were answered. The patient knows to call the clinic with any problems, questions or concerns.  I have spent a total of 60 minutes minutes of face-to-face and non-face-to-face time, preparing to see the patient, obtaining and/or reviewing separately obtained history, performing a medically  appropriate examination, counseling and educating the patient, ordering tests, documenting clinical information in the electronic health record and care coordination.   Sara Query, PA-C Department of Hematology/Oncology [place] [facility] Phone: [phone]

## 2021-02-26 DIAGNOSIS — D751 Secondary polycythemia: Secondary | ICD-10-CM | POA: Insufficient documentation

## 2021-02-26 LAB — ERYTHROPOIETIN: Erythropoietin: 7.3 m[IU]/mL (ref 2.6–18.5)

## 2021-02-27 LAB — CBC WITH DIFFERENTIAL/PLATELET
Absolute Monocytes: 414 cells/uL (ref 200–950)
Basophils Absolute: 9 cells/uL (ref 0–200)
Basophils Relative: 0.2 %
Eosinophils Absolute: 79 cells/uL (ref 15–500)
Eosinophils Relative: 1.8 %
HCT: 41.6 % (ref 35.0–45.0)
Hemoglobin: 14.1 g/dL (ref 11.7–15.5)
Lymphs Abs: 1628 cells/uL (ref 850–3900)
MCH: 33.8 pg — ABNORMAL HIGH (ref 27.0–33.0)
MCHC: 33.9 g/dL (ref 32.0–36.0)
MCV: 99.8 fL (ref 80.0–100.0)
MPV: 11.7 fL (ref 7.5–12.5)
Monocytes Relative: 9.4 %
Neutro Abs: 2270 cells/uL (ref 1500–7800)
Neutrophils Relative %: 51.6 %
Platelets: 234 10*3/uL (ref 140–400)
RBC: 4.17 10*6/uL (ref 3.80–5.10)
RDW: 12.2 % (ref 11.0–15.0)
Total Lymphocyte: 37 %
WBC: 4.4 10*3/uL (ref 3.8–10.8)

## 2021-02-27 LAB — IRON,TIBC AND FERRITIN PANEL
%SAT: 63 % (calc) — ABNORMAL HIGH (ref 16–45)
Ferritin: 52 ng/mL (ref 16–288)
Iron: 204 ug/dL — ABNORMAL HIGH (ref 45–160)
TIBC: 322 mcg/dL (calc) (ref 250–450)

## 2021-02-27 LAB — CARBOXYHEMOGLOBIN: Carboxyhemoglobin: NOT DETECTED %TOTAL HGB

## 2021-03-13 DIAGNOSIS — M1612 Unilateral primary osteoarthritis, left hip: Secondary | ICD-10-CM | POA: Diagnosis not present

## 2021-03-14 LAB — BCR ABL1 FISH (GENPATH)

## 2021-03-14 LAB — JAK2 (INCLUDING V617F AND EXON 12), MPL,& CALR W/RFL MPN PANEL (NGS)

## 2021-03-17 DIAGNOSIS — M1612 Unilateral primary osteoarthritis, left hip: Secondary | ICD-10-CM | POA: Diagnosis not present

## 2021-03-19 DIAGNOSIS — M1612 Unilateral primary osteoarthritis, left hip: Secondary | ICD-10-CM | POA: Diagnosis not present

## 2021-03-21 DIAGNOSIS — M25652 Stiffness of left hip, not elsewhere classified: Secondary | ICD-10-CM | POA: Diagnosis not present

## 2021-03-21 DIAGNOSIS — Z96642 Presence of left artificial hip joint: Secondary | ICD-10-CM | POA: Diagnosis not present

## 2021-03-21 DIAGNOSIS — M6281 Muscle weakness (generalized): Secondary | ICD-10-CM | POA: Diagnosis not present

## 2021-03-24 DIAGNOSIS — Z96642 Presence of left artificial hip joint: Secondary | ICD-10-CM | POA: Diagnosis not present

## 2021-03-24 DIAGNOSIS — M6281 Muscle weakness (generalized): Secondary | ICD-10-CM | POA: Diagnosis not present

## 2021-03-24 DIAGNOSIS — M25652 Stiffness of left hip, not elsewhere classified: Secondary | ICD-10-CM | POA: Diagnosis not present

## 2021-03-26 DIAGNOSIS — Z96642 Presence of left artificial hip joint: Secondary | ICD-10-CM | POA: Diagnosis not present

## 2021-03-26 DIAGNOSIS — M25652 Stiffness of left hip, not elsewhere classified: Secondary | ICD-10-CM | POA: Diagnosis not present

## 2021-03-26 DIAGNOSIS — M6281 Muscle weakness (generalized): Secondary | ICD-10-CM | POA: Diagnosis not present

## 2021-03-31 DIAGNOSIS — M6281 Muscle weakness (generalized): Secondary | ICD-10-CM | POA: Diagnosis not present

## 2021-03-31 DIAGNOSIS — Z96642 Presence of left artificial hip joint: Secondary | ICD-10-CM | POA: Diagnosis not present

## 2021-03-31 DIAGNOSIS — M25652 Stiffness of left hip, not elsewhere classified: Secondary | ICD-10-CM | POA: Diagnosis not present

## 2021-04-01 DIAGNOSIS — Z471 Aftercare following joint replacement surgery: Secondary | ICD-10-CM | POA: Diagnosis not present

## 2021-04-01 DIAGNOSIS — Z96642 Presence of left artificial hip joint: Secondary | ICD-10-CM | POA: Diagnosis not present

## 2021-04-02 DIAGNOSIS — M25652 Stiffness of left hip, not elsewhere classified: Secondary | ICD-10-CM | POA: Diagnosis not present

## 2021-04-02 DIAGNOSIS — Z96642 Presence of left artificial hip joint: Secondary | ICD-10-CM | POA: Diagnosis not present

## 2021-04-02 DIAGNOSIS — M6281 Muscle weakness (generalized): Secondary | ICD-10-CM | POA: Diagnosis not present

## 2021-04-07 ENCOUNTER — Telehealth: Payer: Self-pay | Admitting: Family Medicine

## 2021-04-07 ENCOUNTER — Other Ambulatory Visit: Payer: Self-pay | Admitting: Family Medicine

## 2021-04-07 DIAGNOSIS — Z1231 Encounter for screening mammogram for malignant neoplasm of breast: Secondary | ICD-10-CM

## 2021-04-07 DIAGNOSIS — Z96642 Presence of left artificial hip joint: Secondary | ICD-10-CM | POA: Diagnosis not present

## 2021-04-07 DIAGNOSIS — M25652 Stiffness of left hip, not elsewhere classified: Secondary | ICD-10-CM | POA: Diagnosis not present

## 2021-04-07 DIAGNOSIS — M6281 Muscle weakness (generalized): Secondary | ICD-10-CM | POA: Diagnosis not present

## 2021-04-07 NOTE — Telephone Encounter (Signed)
Patient has order entered for mammogram and wonders if Dr. Raoul Pitch wants her to also have a  bone density exam. Please call patient to advise and enter order if appropriate.

## 2021-04-07 NOTE — Telephone Encounter (Signed)
Spoke with pt and informed her of providers instructions

## 2021-04-07 NOTE — Telephone Encounter (Signed)
Last dexa 04/2019

## 2021-04-07 NOTE — Telephone Encounter (Signed)
I would recommend dexa for next year- every 3 yrs is good with her past results.

## 2021-04-09 DIAGNOSIS — M6281 Muscle weakness (generalized): Secondary | ICD-10-CM | POA: Diagnosis not present

## 2021-04-09 DIAGNOSIS — M25652 Stiffness of left hip, not elsewhere classified: Secondary | ICD-10-CM | POA: Diagnosis not present

## 2021-04-09 DIAGNOSIS — Z96642 Presence of left artificial hip joint: Secondary | ICD-10-CM | POA: Diagnosis not present

## 2021-04-14 DIAGNOSIS — M25652 Stiffness of left hip, not elsewhere classified: Secondary | ICD-10-CM | POA: Diagnosis not present

## 2021-04-14 DIAGNOSIS — Z96642 Presence of left artificial hip joint: Secondary | ICD-10-CM | POA: Diagnosis not present

## 2021-04-14 DIAGNOSIS — M6281 Muscle weakness (generalized): Secondary | ICD-10-CM | POA: Diagnosis not present

## 2021-04-16 ENCOUNTER — Ambulatory Visit
Admission: RE | Admit: 2021-04-16 | Discharge: 2021-04-16 | Disposition: A | Discharge status: Home / Self Care | Payer: Medicare Other | Source: Ambulatory Visit | Attending: Family Medicine | Admitting: Family Medicine

## 2021-04-16 ENCOUNTER — Other Ambulatory Visit: Payer: Self-pay

## 2021-04-16 DIAGNOSIS — Z1231 Encounter for screening mammogram for malignant neoplasm of breast: Secondary | ICD-10-CM | POA: Diagnosis not present

## 2021-04-17 ENCOUNTER — Ambulatory Visit: Payer: Medicare Other

## 2021-04-18 DIAGNOSIS — M25652 Stiffness of left hip, not elsewhere classified: Secondary | ICD-10-CM | POA: Diagnosis not present

## 2021-04-18 DIAGNOSIS — M6281 Muscle weakness (generalized): Secondary | ICD-10-CM | POA: Diagnosis not present

## 2021-04-18 DIAGNOSIS — Z96642 Presence of left artificial hip joint: Secondary | ICD-10-CM | POA: Diagnosis not present

## 2021-05-22 DIAGNOSIS — Z96642 Presence of left artificial hip joint: Secondary | ICD-10-CM | POA: Diagnosis not present

## 2021-05-22 DIAGNOSIS — G5601 Carpal tunnel syndrome, right upper limb: Secondary | ICD-10-CM | POA: Diagnosis not present

## 2021-05-22 DIAGNOSIS — Z471 Aftercare following joint replacement surgery: Secondary | ICD-10-CM | POA: Diagnosis not present

## 2021-05-22 DIAGNOSIS — M25561 Pain in right knee: Secondary | ICD-10-CM | POA: Diagnosis not present

## 2021-05-22 DIAGNOSIS — S83241A Other tear of medial meniscus, current injury, right knee, initial encounter: Secondary | ICD-10-CM | POA: Diagnosis not present

## 2021-05-27 DIAGNOSIS — H2513 Age-related nuclear cataract, bilateral: Secondary | ICD-10-CM | POA: Diagnosis not present

## 2021-05-27 DIAGNOSIS — H35372 Puckering of macula, left eye: Secondary | ICD-10-CM | POA: Diagnosis not present

## 2021-06-16 DIAGNOSIS — G5601 Carpal tunnel syndrome, right upper limb: Secondary | ICD-10-CM | POA: Diagnosis not present

## 2021-06-16 DIAGNOSIS — M1711 Unilateral primary osteoarthritis, right knee: Secondary | ICD-10-CM | POA: Diagnosis not present

## 2021-06-19 DIAGNOSIS — Z23 Encounter for immunization: Secondary | ICD-10-CM | POA: Diagnosis not present

## 2021-06-23 DIAGNOSIS — M1711 Unilateral primary osteoarthritis, right knee: Secondary | ICD-10-CM | POA: Diagnosis not present

## 2021-06-30 DIAGNOSIS — M1711 Unilateral primary osteoarthritis, right knee: Secondary | ICD-10-CM | POA: Diagnosis not present

## 2021-08-07 ENCOUNTER — Ambulatory Visit: Payer: Medicare Other | Admitting: Family Medicine

## 2021-08-07 ENCOUNTER — Emergency Department (HOSPITAL_COMMUNITY)
Admission: EM | Admit: 2021-08-07 | Discharge: 2021-08-07 | Disposition: A | Discharge status: Home / Self Care | Payer: Medicare Other | Attending: Emergency Medicine | Admitting: Emergency Medicine

## 2021-08-07 DIAGNOSIS — Z5321 Procedure and treatment not carried out due to patient leaving prior to being seen by health care provider: Secondary | ICD-10-CM | POA: Diagnosis not present

## 2021-08-07 DIAGNOSIS — R42 Dizziness and giddiness: Secondary | ICD-10-CM | POA: Insufficient documentation

## 2021-08-07 DIAGNOSIS — R55 Syncope and collapse: Secondary | ICD-10-CM | POA: Insufficient documentation

## 2021-08-07 DIAGNOSIS — R109 Unspecified abdominal pain: Secondary | ICD-10-CM | POA: Diagnosis not present

## 2021-08-07 LAB — CBC WITH DIFFERENTIAL/PLATELET
Abs Immature Granulocytes: 0.04 10*3/uL (ref 0.00–0.07)
Basophils Absolute: 0 10*3/uL (ref 0.0–0.1)
Basophils Relative: 0 %
Eosinophils Absolute: 0 10*3/uL (ref 0.0–0.5)
Eosinophils Relative: 0 %
HCT: 43.2 % (ref 36.0–46.0)
Hemoglobin: 14.3 g/dL (ref 12.0–15.0)
Immature Granulocytes: 0 %
Lymphocytes Relative: 10 %
Lymphs Abs: 1 10*3/uL (ref 0.7–4.0)
MCH: 33.6 pg (ref 26.0–34.0)
MCHC: 33.1 g/dL (ref 30.0–36.0)
MCV: 101.4 fL — ABNORMAL HIGH (ref 80.0–100.0)
Monocytes Absolute: 0.7 10*3/uL (ref 0.1–1.0)
Monocytes Relative: 7 %
Neutro Abs: 8.3 10*3/uL — ABNORMAL HIGH (ref 1.7–7.7)
Neutrophils Relative %: 83 %
Platelets: 197 10*3/uL (ref 150–400)
RBC: 4.26 MIL/uL (ref 3.87–5.11)
RDW: 13.1 % (ref 11.5–15.5)
WBC: 10.1 10*3/uL (ref 4.0–10.5)
nRBC: 0 % (ref 0.0–0.2)

## 2021-08-07 LAB — COMPREHENSIVE METABOLIC PANEL
ALT: 16 U/L (ref 0–44)
AST: 26 U/L (ref 15–41)
Albumin: 4.2 g/dL (ref 3.5–5.0)
Alkaline Phosphatase: 68 U/L (ref 38–126)
Anion gap: 10 (ref 5–15)
BUN: 18 mg/dL (ref 8–23)
CO2: 27 mmol/L (ref 22–32)
Calcium: 10 mg/dL (ref 8.9–10.3)
Chloride: 98 mmol/L (ref 98–111)
Creatinine, Ser: 0.77 mg/dL (ref 0.44–1.00)
GFR, Estimated: >60 mL/min (ref >60)
Glucose, Bld: 142 mg/dL — ABNORMAL HIGH (ref 70–99)
Potassium: 4.6 mmol/L (ref 3.5–5.1)
Sodium: 135 mmol/L (ref 135–145)
Total Bilirubin: 0.9 mg/dL (ref 0.3–1.2)
Total Protein: 7 g/dL (ref 6.5–8.1)

## 2021-08-07 LAB — URINALYSIS, ROUTINE W REFLEX MICROSCOPIC
Bilirubin Urine: NEGATIVE
Glucose, UA: NEGATIVE mg/dL
Hgb urine dipstick: NEGATIVE
Ketones, ur: NEGATIVE mg/dL
Leukocytes,Ua: NEGATIVE
Nitrite: NEGATIVE
Protein, ur: NEGATIVE mg/dL
Specific Gravity, Urine: 1.02 (ref 1.005–1.030)
pH: 6 (ref 5.0–8.0)

## 2021-08-07 LAB — MAGNESIUM: Magnesium: 2.2 mg/dL (ref 1.7–2.4)

## 2021-08-07 NOTE — ED Notes (Signed)
Pt decided to leave while waiting for a room.  

## 2021-08-07 NOTE — ED Provider Notes (Signed)
Emergency Medicine Provider Triage Evaluation Note  Sara Doyle , a 75 y.o. female  was evaluated in triage.  Pt complains of syncope this morning after her workout at the [employer].  Reports she did not have anything to eat prior to.  Similar episodes in the past, however when she was at the [facility], nursing staff had advised her that she likely had an irregular heartbeat.  He reports overall without any complaints.  At the time the episode occurred she felt some abdominal pain, some clamminess according to the note but this has now resolved.  No thyroid history, no prior history of A. fib, no prior history medical history.   Review of Systems  Positive: syncope Negative: Chest pain, shortness of breath, headache, abdominal pain, vomiting  Physical Exam  BP 121/78 (BP Location: Left Arm)   Pulse 72   Temp 97.8 F (36.6 C)   Resp 14   SpO2 98%  Gen:   Awake, no distress   Resp:  Normal effort  MSK:   Moves extremities without difficulty  Other:  lung's are clear to auscultation, upper and lower extremities.  Ambulatory with steady gait.  Medical Decision Making  Medically screening exam initiated at 1:05 PM.  Appropriate orders placed.  SUNDAY KLOS was informed that the remainder of the evaluation will be completed by another provider, this initial triage assessment does not replace that evaluation, and the importance of remaining in the ED until their evaluation is complete.  Syncopal episode this morning after a work-up.  No prior medical history, currently on no blood thinners.  Was lowered onto the ground did not strike her head.  Vitals are within normal limits here today.  EKG NSR.   Janeece Fitting, PA-C 08/07/21 1314    Lorelle Gibbs, DO 08/08/21 1021

## 2021-08-07 NOTE — Progress Notes (Signed)
Asked to evaluate for nausea, dizziness after working out; arrived to find her assisted to floor by [facility] staff, color pale, nausea residing,  skin still cool and clammy, reports not eating this morning; O2 sat 97, HR 60's, BP 1207/70; she reports feeling better, but do notice irregular radial pulse every 3-4 beats; declines evaluation by EMS; husband is with her; assisted to sit up slowly; color improving, denies nausea/dizziness but irreg radial pulse still noted; assisted to stand; encouraged to eat small meal prior to working out; advised to contact MD today about this event and noted irreg HR.

## 2021-08-08 ENCOUNTER — Telehealth: Payer: Self-pay

## 2021-08-08 NOTE — Telephone Encounter (Signed)
Pt went to ED on 12/1  [place] [facility] - Client [place] RECORD [facility] Patient Name: Sara Doyle Gender: Female DOB: [birth year] Age: 75 Y 11 M 6 D Return Phone Number: [phone] (Primary), [phone] (Secondary) Address: City/ State/ Zip: [place]  [address] Client [place] [facility] - Client Client Site [place] - Day Provider Raoul Pitch, [place] Contact Type Call Who Is Calling Patient / Member / Family / Caregiver Call Type Triage / Clinical Relationship To Patient Self Return Phone Number Please choose phone number Chief Complaint [place] [facility] [place] Reason for Call Symptomatic / Request for Health Information Initial Comment [facility] [facility] [place] [facility] [place] with an irregular heart beat. no appointments available in office. Translation No Nurse Assessment Nurse: Claiborne Billings, RN, Kim Date/Time (Eastern Time): 08/07/2021 11:32:38 AM Confirm and document reason for call. If symptomatic, describe symptoms. ---[facility] had exercised on the elliptical for about 20 minutes when she began to feel dizzy and lightheaded. She sat down for a minute and then passed out; staff nurse checked her vitals and informed caller that she detected an irregular heartbeat, told her to go to UC or call her doctor. States this happened once last year when she didn't eat before exercising. Does the patient have any new or worsening symptoms? ---Yes Will a triage be completed? ---Yes Related visit to physician within the last 2 weeks? ---No Does the PT have any chronic conditions? (i.e. diabetes, asthma, this includes High risk factors for pregnancy, etc.) ---No Is this a behavioral health or substance abuse call? ---No Guidelines Guideline Title Affirmed Question Affirmed Notes Nurse Date/Time (Eastern Time) Fainting Age > 50 years (Exception:  occurred > 1 hour ago AND now feels completely fine) Claiborne Billings, RN, Kim 08/07/2021 11:35:31 AM PLEASE NOTE: All timestamps contained within this report are represented as [place] Standard Time. CONFIDENTIALTY NOTICE: This fax transmission is intended only for the addressee. It contains information that is legally privileged, confidential or otherwise protected from use or disclosure. If you are not the intended recipient, you are strictly prohibited from reviewing, disclosing, copying using or disseminating any of this information or taking any action in reliance on or regarding this information. If you have received this fax in error, please notify us immediately by telephone so that we can arrange for its return to [place]. Phone: [phone], Toll-Free: [phone], Fax: [phone] Page: 2 of 2 Call Id: [id number] [place]. Time Eilene Ghazi Time) Disposition Final User 08/07/2021 11:29:58 AM Send to Urgent Queue Peggye Fothergill 08/07/2021 11:40:01 AM 911 Outcome Documentation Claiborne Billings, RN, Maudie Mercury Reason: Refused to call 911, husband will drive her to ER 05/09/3299 11:37:40 AM Call EMS 911 Now Yes Claiborne Billings, RN, Max Sane Disagree/Comply Comply Caller Understands Yes PreDisposition Did not know what to do Care Advice Given Per Guideline CALL EMS 911 NOW: * Immediate medical attention is needed. You need to hang up and call 911 (or an ambulance). * Triager Discretion: I'll call you back in a few minutes to be sure you were able to reach them. CARE ADVICE given per Fainting (Adult) guideline. Referrals [facility] - ED

## 2021-08-12 ENCOUNTER — Other Ambulatory Visit: Payer: Self-pay

## 2021-08-12 ENCOUNTER — Encounter: Payer: Self-pay | Admitting: Family Medicine

## 2021-08-12 ENCOUNTER — Ambulatory Visit (INDEPENDENT_AMBULATORY_CARE_PROVIDER_SITE_OTHER): Payer: Medicare Other | Admitting: Family Medicine

## 2021-08-12 VITALS — BP 113/73 | HR 53 | Temp 98.1°F | Ht 61.0 in | Wt 129.0 lb

## 2021-08-12 DIAGNOSIS — D751 Secondary polycythemia: Secondary | ICD-10-CM | POA: Diagnosis not present

## 2021-08-12 DIAGNOSIS — E7800 Pure hypercholesterolemia, unspecified: Secondary | ICD-10-CM

## 2021-08-12 DIAGNOSIS — D7589 Other specified diseases of blood and blood-forming organs: Secondary | ICD-10-CM | POA: Diagnosis not present

## 2021-08-12 DIAGNOSIS — M8580 Other specified disorders of bone density and structure, unspecified site: Secondary | ICD-10-CM | POA: Diagnosis not present

## 2021-08-12 DIAGNOSIS — I499 Cardiac arrhythmia, unspecified: Secondary | ICD-10-CM | POA: Diagnosis not present

## 2021-08-12 DIAGNOSIS — R55 Syncope and collapse: Secondary | ICD-10-CM

## 2021-08-12 LAB — LIPID PANEL
Cholesterol: 231 mg/dL — ABNORMAL HIGH (ref 0–200)
HDL: 103.1 mg/dL (ref >39.00)
LDL Cholesterol: 112 mg/dL — ABNORMAL HIGH (ref 0–99)
NonHDL: 127.41
Total CHOL/HDL Ratio: 2
Triglycerides: 79 mg/dL (ref 0.0–149.0)
VLDL: 15.8 mg/dL (ref 0.0–40.0)

## 2021-08-12 LAB — TSH: TSH: 2.99 u[IU]/mL (ref 0.35–5.50)

## 2021-08-12 NOTE — Patient Instructions (Signed)
Syncope, Adult Syncope is when you pass out or faint for a short time. It is caused by a sudden decrease in blood flow to the brain. This can happen for many reasons. It can sometimes happen when seeing blood, getting a shot (injection), or having pain or strong emotions. Most causes of fainting are not dangerous, but in some cases it can be a sign of a serious medical problem. If you faint, get help right away. Call your local emergency services (911 in the [place] Follow these instructions at home: Watch for any changes in your symptoms. Take these actions to stay safe and help with your symptoms: Knowing when you may be about to faint Signs that you may be about to faint include: Feeling dizzy or light-headed. It may feel like the room is spinning. Feeling weak. Feeling like you may vomit (nauseous). Seeing spots or seeing all white or all black. Having cold, clammy skin. Feeling warm and sweaty. Hearing ringing in the ears. If you start to feel like you might faint, sit or lie down right away. If sitting, lower your head down between your legs. If lying down, raise (elevate) your feet above the level of your heart. Breathe deeply and steadily. Wait until all of the symptoms are gone. Have someone stay with you until you feel better. Medicines Take over-the-counter and prescription medicines only as told by your doctor. If you are taking blood pressure or heart medicine, sit up and stand up slowly. Spend a few minutes getting ready to sit and then stand. This can help you feel less dizzy. Lifestyle Do not drive, use machinery, or play sports until your doctor says it is okay. Do not drink alcohol. Do not smoke or use any products that contain nicotine or tobacco. If you need help quitting, ask your doctor. Avoid hot tubs and saunas. General instructions Talk with your doctor about your symptoms. You may need to have testing to help find the cause. Drink enough fluid to keep your pee  (urine) pale yellow. Avoid standing for a long time. If you must stand for a long time, do movements such as: Moving your legs. Crossing your legs. Flexing and stretching your leg muscles. Squatting. Keep all follow-up visits. Contact a doctor if: You have episodes of near fainting. Get help right away if: You pass out or faint. You hit your head or are injured after fainting. You have any of these symptoms: Fast or uneven heartbeats (palpitations). Pain in your chest, belly, or back. Shortness of breath. You have jerky movements that you cannot control (seizure). You have a very bad headache. You are confused. You have problems with how you see (vision). You are very weak. You have trouble walking. You are bleeding from your mouth or your butt (rectum). You have black or tarry poop (stool). These symptoms may be an emergency. Get help right away. Call your local emergency services (911 in the [place] Do not wait to see if the symptoms will go away. Do not drive yourself to the hospital. Summary Syncope is when you pass out or faint for a short time. It is caused by a sudden decrease in blood flow to the brain. Signs that you may be about to faint include feeling dizzy or light-headed, feeling like you may vomit, seeing all white or all black, or having cold, clammy skin. If you start to feel like you might faint, sit or lie down right away. Lower your head if sitting, or raise (elevate) your  feet if lying down. Breathe deeply and steadily. Wait until all of the symptoms are gone. This information is not intended to replace advice given to you by your health care provider. Make sure you discuss any questions you have with your health care provider. Document Revised: 01/02/2021 Document Reviewed: 01/02/2021 Elsevier Patient Education  2022 [employer].

## 2021-08-12 NOTE — Progress Notes (Signed)
This visit occurred during the SARS-CoV-2 public health emergency.  Safety protocols were in place, including screening questions prior to the visit, additional usage of staff PPE, and extensive cleaning of exam room while observing appropriate contact time as indicated for disinfecting solutions.    Patient ID: Sara Doyle, female  DOB: September 08, 1945, 75 y.o.   MRN: [id number] Patient Care Team    Relationship Specialty Notifications Start End  Ma Hillock, DO PCP - General Family Medicine  05/10/15   Milus Banister, MD Attending Physician Gastroenterology  10/05/16   Rutherford Guys, MD Consulting Physician Ophthalmology  10/05/16   Dickie La, MD Consulting Physician Family Medicine  10/11/17    Comment: Manson Passey)  Ellis Parents, DDS Referring Physician Dentistry  08/12/21     Chief Complaint  Patient presents with   Hyperlipidemia    [place]; pt is fasting    Subjective:  Sara Doyle is a 75 y.o.  Female  present for Sara Doyle All past medical history, surgical history, allergies, family history, immunizations, medications and social history were updated in the electronic medical record today. All recent labs, ED visits and hospitalizations within the last year were reviewed.  Osteopenia, unspecified location dexa up-to-date 04/2019, T score -1.5, and pt taking vit d supplement.   Elevated cholesterol/fhx heart disease in bother.  Has has been diet controlled the past.  However her last LDL did increase to put her at a mild elevated risk in comparison to normal population.  She attempted to start low-dose Crestor 5 mg every other day and was unable to tolerate secondary to myalgias.  She elected to discontinue Crestor and not start new medication unless cholesterol is elevated again today.  Syncopal episode: Review of patient's EMR, she was seen in the emergency room August 07, 2021 for a syncopal episode.  She states she did pass out, it was less than a minute.  She states her  bladder did release.  She denies any postictal phase or seizure-like activity.  The event was witnessed.  She was at the gym exercising and had not eaten that day.  She believes her sugar went low and caused her to get dizzy and passed out.  The nurse at the gym told her if she heard a "irregular heart beat.  "  Depression screen [facility] 2/9 08/12/2021 11/01/2020 10/30/2020 10/23/2020 10/20/2019  Decreased Interest 0 0 0 0 0  Down, Depressed, Hopeless 0 0 0 0 0  PHQ - 2 Score 0 0 0 0 0   No flowsheet data found.   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   Dr Berenice Primas 05/2018   Muscle strain of thigh, right, initial encounter 01/27/2017   Vaginal dryness, menopausal 10/04/2015   Allergies  Allergen Reactions   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 and Swelling    Tongue swelling/difficulty breathing   Demerol [Meperidine] Nausea And Vomiting   Past Surgical History:  Procedure Laterality Date   BUNIONECTOMY Right    FRACTURE SURGERY  1969   right leg  FRACTURE SURGERY  2005   right leg   SKIN LESION EXCISION Right 07/08/2017   Behind right knee, benign   TIBIA FRACTURE SURGERY Right    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, '05   TOTAL HIP ARTHROPLASTY Right 05/20/2018   Procedure: RIGHT TOTAL HIP ARTHROPLASTY ANTERIOR APPROACH;  Surgeon: [name], MD;  Location: [facility];  Service: Orthopedics;  Laterality: Right;   TUBAL LIGATION      Family History  Problem Relation Age of Onset   Hypertension Mother    Arthritis Father        osteo   Heart disease Brother        congenital   Breast cancer Sister 30       breast   Arthritis Sister    Hypertension Sister    Heart disease Brother    Cancer Brother        prostate, colon   Arrhythmia Brother        a-fib   Heart disease Brother    Colon cancer Neg Hx    Esophageal cancer Neg Hx    Rectal cancer Neg Hx    Stomach cancer Neg Hx    Social History   Social History Narrative   Sara Doyle lives with her husband, She relocated from [place] Nov., 2015. She is retired, has 3 grown sons that live in [place].   Smoke alarm in the home, wears her seatbelt, uses sunscreen.   Ambulates independently. No dentures.   Has dentist with regular appts.    Feels safe in her relationships    Allergies as of 08/12/2021       Reactions   Oysters [shellfish Allergy] Anaphylaxis, Swelling   Tongue swelling/difficulty breathing   Demerol [meperidine] Nausea And Vomiting        Medication List        Accurate as of August 12, 2021 11:59 PM. If you have any questions, ask your nurse or doctor.          b complex vitamins tablet Take 1 tablet by mouth daily.   OVER THE COUNTER MEDICATION Areds preservision one tablet daily.   Oyster Shell Calcium/D 250-125 MG-UNIT Tabs Take by mouth.        All past medical history, surgical history, allergies, family history, immunizations andmedications were updated in the EMR today and reviewed under the history and medication portions of their EMR.      ROS: 14 pt review of systems performed and negative (unless mentioned in an HPI)  Objective: BP 113/73   Pulse (!) 53   Temp 98.1 F (36.7 C) (Oral)   Ht 5\' 1"  (1.549 m)   Wt 129 lb (58.5 kg)   SpO2 97%   BMI 24.37 kg/m  Gen: Afebrile. No acute distress.  Very pleasant female.  HENT: AT. [place].  Cough.  No hoarseness. Eyes:Pupils Equal Round Reactive to  light, Extraocular movements intact,  Conjunctiva without redness, discharge or icterus. Neck/lymp/endocrine: Supple, no lymphadenopathy, no thyromegaly CV: Occasional irregular beat and sets of 3, regular rate, no edema Chest: CTAB, no wheeze or crackles Skin: No rashes, purpura or petechiae.  Neuro: Normal gait. PERLA. EOMi. Alert. Orientedx3 Psych: Normal affect, dress and demeanor. Normal speech. Normal thought content and judgment.    No results found.  Assessment/plan: MERTIE HASLEM is a 75 y.o. female present for [facility] Osteopenia, unspecified location Bone density is ordered due 04/19/2021 Continue vit d supplement.   Hyperlipidemia: Unable to tolerate even low-dose every other  day Crestor.  Iron overload/macrocytosis without anemia - Iron, TIBC and Ferritin Panel  Elevated cholesterol - Lipid panel  Syncope and collapse/Irregular heart rate Reviewed records available in EMR with her today.  She denies any further symptoms, dizziness or syncopal episode.  She denies any chest pain.  EKG during ED visit was normal. cBC and urinalysis from ED visit was normal.  CMP had a glucose of 142, however this was after she had eaten a energy bar. Her heart beat is irregular today, possible trigeminy.  We discussed this today in she agreed to cardiology referral.  Cannot rule out arrhythmia as cause of her syncope. - TSH - Ambulatory referral to Cardiology      Return in about 24 weeks (around 01/27/2022) for [place] (15 min).    Orders Placed This Encounter  Procedures   Lipid panel   Iron, TIBC and Ferritin Panel   TSH   Ambulatory referral to Cardiology    No orders of the defined types were placed in this encounter.  Referral Orders         Ambulatory referral to Cardiology       Electronically signed by: Howard Pouch, DO [place]

## 2021-08-13 LAB — IRON,TIBC AND FERRITIN PANEL
%SAT: 64 % (calc) — ABNORMAL HIGH (ref 16–45)
Ferritin: 37 ng/mL (ref 16–288)
Iron: 230 ug/dL — ABNORMAL HIGH (ref 45–160)
TIBC: 362 mcg/dL (calc) (ref 250–450)

## 2021-08-14 ENCOUNTER — Encounter: Payer: Self-pay | Admitting: Family Medicine

## 2021-09-05 ENCOUNTER — Encounter (HOSPITAL_BASED_OUTPATIENT_CLINIC_OR_DEPARTMENT_OTHER): Payer: Self-pay

## 2021-09-09 ENCOUNTER — Encounter: Payer: Self-pay | Admitting: Family Medicine

## 2021-09-22 ENCOUNTER — Ambulatory Visit (INDEPENDENT_AMBULATORY_CARE_PROVIDER_SITE_OTHER): Payer: Medicare Other

## 2021-09-22 ENCOUNTER — Other Ambulatory Visit: Payer: Self-pay

## 2021-09-22 ENCOUNTER — Encounter (HOSPITAL_BASED_OUTPATIENT_CLINIC_OR_DEPARTMENT_OTHER): Payer: Self-pay | Admitting: Cardiology

## 2021-09-22 ENCOUNTER — Ambulatory Visit (INDEPENDENT_AMBULATORY_CARE_PROVIDER_SITE_OTHER): Payer: Medicare Other | Admitting: Cardiology

## 2021-09-22 VITALS — BP 104/72 | HR 86

## 2021-09-22 DIAGNOSIS — I493 Ventricular premature depolarization: Secondary | ICD-10-CM

## 2021-09-22 DIAGNOSIS — Z7189 Other specified counseling: Secondary | ICD-10-CM | POA: Diagnosis not present

## 2021-09-22 DIAGNOSIS — I491 Atrial premature depolarization: Secondary | ICD-10-CM

## 2021-09-22 DIAGNOSIS — R55 Syncope and collapse: Secondary | ICD-10-CM | POA: Diagnosis not present

## 2021-09-22 NOTE — Patient Instructions (Signed)
Medication Instructions:  Your Physician recommend you continue on your current medication as directed.    *If you need a refill on your cardiac medications before your next appointment, please call your pharmacy*   Lab Work: None ordered today   Testing/Procedures: Your physician has requested that you have an echocardiogram. Echocardiography is a painless test that uses sound waves to create images of your heart. It provides your doctor with information about the size and shape of your heart and how well your hearts chambers and valves are working. This procedure takes approximately one hour. There are no restrictions for this procedure. [place] has recommended that you wear an  14 DAY ZIO-PATCH monitor. The Zio patch cardiac monitor continuously records heart rhythm data for up to 14 days, this is for patients being evaluated for multiple types heart rhythms. For the first 24 hours post application, please avoid getting the Zio monitor wet in the shower or by excessive sweating during exercise. After that, feel free to carry on with regular activities. Keep soaps and lotions away from the ZIO XT Patch.      Follow-Up: At [facility], you and your health needs are our priority.  As part of our continuing mission to provide you with exceptional heart care, we have created designated Provider Care Teams.  These Care Teams include your primary Cardiologist (physician) and Advanced Practice Providers (APPs -  Physician Assistants and Nurse Practitioners) who all work together to provide you with the care you need, when you need it.  We recommend signing up for the patient portal called "MyChart".  Sign up information is provided on this After Visit Summary.  MyChart is used to connect with patients for Virtual Visits (Telemedicine).  Patients are able to view lab/test results, encounter notes, upcoming appointments, etc.  Non-urgent messages can be  sent to your provider as well.   To learn more about what you can do with MyChart, go to [email or web address].    Your next appointment:   As needed   The format for your next appointment:   In Person  Provider:   Buford Dresser, MD

## 2021-09-22 NOTE — Progress Notes (Signed)
Cardiology Office Note:    Date:  09/22/2021   ID:  Sara Doyle, DOB Nov 16, 1945, MRN [id number]  PCP:  Ma Hillock, DO  Cardiologist:  None  Referring MD: Ma Hillock, DO   CC: new patient evaluation for syncope  History of Present Illness:    Sara Doyle is a 76 y.o. female without significant PMH who is seen as a new consult at the request of Kuneff, Renee A, DO for the evaluation and management of syncope.  On 09/09/2021 she reported testing positive for COVID with home testing. She had cough and congestion, but no fever. She planned to quarantine for 5 days.  On 08/07/2021 Sara Doyle presented to the ED following a syncopal episode that morning after a workout at the [facility]. She was evaluated by triage. At the time of her syncope she also had associated abdominal pain and clamminess. She noted having similar episodes previously, but nursing staff at the [facility] also advised her that she likely had an irregular heartbeat. She left while waiting for a room and her evaluation was not completed.  Cardiovascular risk factors: Prior clinical ASCVD: None Comorbid conditions: Marginal hyperlipidemia, was started on a statin last year. After a few days this was discontinued due to adverse reactions. Metabolic syndrome/Obesity: Highest adult weight is her current weight, 129 lbs. Chronic inflammatory conditions: None Tobacco use history: Former smoker. Quit in 1999. Family history: Her mother had hypertension, died of possible cardiac arrest. Her father had osteoarthritis, died of cardiac arrest. Her sister had hypertension, died last year. One surviving sister does not have hypertension. Her 2nd oldest brother had a congenital heart defect. Prior cardiac testing and/or incidental findings on other testing (ie coronary calcium): Exercise level: Regularly works out at [employer] including weight lifting machines and the elliptical. Enjoys hiking, participated in tai chi prior to Covid  restrictions. She was not formally exercising as frequently during the holiday season. Current diet: She reports her diet is healthy.  Syncope: Initial episode: Two episodes prior to most recent. Her most recent episode 08/07/21 occurred after finishing her work out on the elliptical. She went to the other room and sat down, feeling woozy. The next thing she remembers is others standing over her. When she woke up she was lucid. She notes that she had not eaten anything that day.   Frequency: 3 episodes total. About once a year.  Duration of episodes: momentary Presyncopal symptoms: Feeling woozy. Post syncope symptoms: none Aggravating/alleviating factors: All episodes have been similar, and occurred during/after exercise. Pre-existing medical conditions: none Potential medication/supplement interactions: none Prior workup: none  Of note, she endorses some RLE edema that she attributes to previous surgeries.  She follows up with her PCP next month for blood work.  She denies any palpitations, chest pain, or shortness of breath. No headaches, orthopnea, or PND.  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   Calcium Carbonate-Vitamin D (OYSTER SHELL CALCIUM/D) 250-125 MG-UNIT TABS Take by mouth.   OVER THE COUNTER MEDICATION Areds preservision one tablet daily.   No current facility-administered medications on file prior to visit.     Allergies:   Oysters [shellfish allergy] and Demerol [meperidine]   Social History   Tobacco Use   Smoking status: Former    Packs/day: 1.00    Years: 25.00    Pack years: 25.00    Types: Cigarettes    Quit date: 09/07/1993    Years since quitting: 28.0   Smokeless tobacco: Never  Vaping Use   Vaping Use: Never used  Substance Use Topics   Alcohol use: Yes    Alcohol/week: 4.0 standard drinks    Types: 4 Glasses of wine per week    Comment: 4-5 glasses/week   Drug use: No    Family History: family history includes Arrhythmia in her brother; Arthritis in her father and sister; Breast cancer (age of onset: 65) in her sister; Cancer in her brother; Heart disease in her brother, brother, and brother; Hypertension in her mother and sister. There is no history of Colon cancer, Esophageal cancer, Rectal cancer, or Stomach cancer.  ROS:   Please see the history of present illness.  Additional pertinent ROS: Constitutional: Negative for chills, fever, night sweats, unintentional weight loss  HENT: Negative for ear pain and hearing loss.   Eyes: Negative for loss of vision and eye pain.  Respiratory: Negative for wheezing. Positive for cough with clear sputum production.  Cardiovascular: See HPI. Gastrointestinal: Negative for abdominal pain, melena, and hematochezia.  Genitourinary: Negative for dysuria and hematuria.  Musculoskeletal: Negative for falls and myalgias.  Skin: Negative for itching and rash.  Neurological: Negative for focal weakness, focal sensory changes. Positive for loss of consciousness, lightheadedness (felt "woozy"). Endo/Heme/Allergies: Does not bruise/bleed easily.     EKGs/Labs/Other Studies  Reviewed:    The following studies were reviewed today:  No prior cardiovascular studies available.   EKG:  EKG is personally reviewed.   09/22/2021: SR at 86 bpm with PAC and PVCs (two slightly different morphologies)  Recent Labs: 08/07/2021: ALT 16; BUN 18; Creatinine, Ser 0.77; Hemoglobin 14.3; Magnesium 2.2; Platelets 197; Potassium 4.6; Sodium 135 08/12/2021: TSH 2.99   Recent Lipid Panel    Component Value Date/Time   CHOL 231 (H) 08/12/2021 0848   TRIG 79.0 08/12/2021 0848   HDL 103.10 08/12/2021 0848   CHOLHDL 2 08/12/2021 0848   VLDL 15.8 08/12/2021 0848   LDLCALC 112 (H) 08/12/2021 0848   LDLCALC 136 (H) 11/01/2020 1337    Physical Exam:    VS:  BP 104/72 (BP Location: Left Arm, Patient Position: Sitting, Cuff Size: Normal)    Pulse 86     Orthostatic VS for the past 24 hrs (Last 3 readings):  BP- Lying Pulse- Lying BP- Sitting Pulse- Sitting BP- Standing at 0 minutes Pulse- Standing at 0 minutes BP- Standing at 3 minutes Pulse- Standing at 3 minutes  09/22/21 1355 106/68 85 127/85 101 115/79 93 118/76 97     Wt Readings from Last 3 Encounters:  08/12/21 129 lb (58.5 kg)  02/25/21  130 lb 11.2 oz (59.3 kg)  02/20/21 128 lb (58.1 kg)    GEN: Well nourished, well developed in no acute distress HEENT: Normal, moist mucous membranes NECK: No JVD CARDIAC: regular rhythm with occasional premature beat, normal S1 and S2, no rubs or gallops. No murmur. VASCULAR: Radial and DP pulses 2+ bilaterally. No carotid bruits RESPIRATORY:  Clear to auscultation without rales, wheezing or rhonchi  ABDOMEN: Soft, non-tender, non-distended MUSCULOSKELETAL:  Ambulates independently SKIN: Warm and dry, no edema NEUROLOGIC:  Alert and oriented x 3. No focal neuro deficits noted. PSYCHIATRIC:  Normal affect    ASSESSMENT:    1. Syncope and collapse   2. PVC (premature ventricular contraction)   3. PAC (premature atrial contraction)   4. Cardiac risk counseling   5. Counseling  on health promotion and disease prevention    PLAN:    Syncope PAC PVCs -will get echo, event monitor to rule out cardiac etiology -if testing unremarkable, suspect possible dehydration/orthostasis. Not orthostatic on today's vitals -no recurrent symptoms with eating something before working out -counseled on different potential causes of syncope -counseled on red flag warning signs that need immediate medical attention  Cardiac risk counseling and prevention recommendations: -recommend heart healthy/Mediterranean diet, with whole grains, fruits, vegetable, fish, lean meats, nuts, and olive oil. Limit salt. -recommend moderate walking, 3-5 times/week for 30-50 minutes each session. Aim for at least 150 minutes.week. Goal should be pace of 3 miles/hours, or walking 1.5 miles in 30 minutes -recommend avoidance of tobacco products. Avoid excess alcohol. -ASCVD risk score: The ASCVD Risk score (Arnett DK, et al., 2019) failed to calculate for the following reasons:   The valid HDL cholesterol range is 20 to 100 mg/dL    Plan for follow up: TBD based on results of monitor and echo. If unremarkable, can follow up as needed  Buford Dresser, MD, PhD, [facility]    Medication Adjustments/Labs and Tests Ordered: Current medicines are reviewed at length with the patient today.  Concerns regarding medicines are outlined above.   Orders Placed This Encounter  Procedures   LONG TERM MONITOR (3-14 DAYS)   EKG 12-Lead   ECHOCARDIOGRAM COMPLETE   No orders of the defined types were placed in this encounter.  Patient Instructions  Medication Instructions:  Your Physician recommend you continue on your current medication as directed.    *If you need a refill on your cardiac medications before your next appointment, please call your pharmacy*   Lab Work: None ordered today   Testing/Procedures: Your physician has requested that you have an echocardiogram.  Echocardiography is a painless test that uses sound waves to create images of your heart. It provides your doctor with information about the size and shape of your heart and how well your hearts chambers and valves are working. This procedure takes approximately one hour. There are no restrictions for this procedure. [place] has recommended that you wear an  14 DAY ZIO-PATCH monitor. The Zio patch cardiac monitor continuously records heart rhythm data for up to 14 days, this is for patients being evaluated for multiple types heart rhythms. For the first 24 hours post application, please avoid getting the Zio monitor wet in the shower or by excessive sweating during exercise. After that, feel free to carry on with regular activities. Keep soaps and lotions away from the ZIO XT Patch.      Follow-Up: At [facility], you and your health  needs are our priority.  As part of our continuing mission to provide you with exceptional heart care, we have created designated Provider Care Teams.  These Care Teams include your primary Cardiologist (physician) and Advanced Practice Providers (APPs -  Physician Assistants and Nurse Practitioners) who all work together to provide you with the care you need, when you need it.  We recommend signing up for the patient portal called "MyChart".  Sign up information is provided on this After Visit Summary.  MyChart is used to connect with patients for Virtual Visits (Telemedicine).  Patients are able to view lab/test results, encounter notes, upcoming appointments, etc.  Non-urgent messages can be sent to your provider as well.   To learn more about what you can do with MyChart, go to [email or web address].    Your next appointment:   As needed   The format for your next appointment:   In Person  Provider:   Buford Dresser, MD       [facility],acting as a scribe for Buford Dresser, MD.,have  documented all relevant documentation on the behalf of Buford Dresser, MD,as directed by  Buford Dresser, MD while in the presence of Buford Dresser, MD.  I, Buford Dresser, MD, have reviewed all documentation for this visit. The documentation on 09/22/21 for the exam, diagnosis, procedures, and orders are all accurate and complete.   Signed, Buford Dresser, MD PhD 09/22/2021 7:13 PM    [facility]

## 2021-10-08 ENCOUNTER — Ambulatory Visit (INDEPENDENT_AMBULATORY_CARE_PROVIDER_SITE_OTHER): Payer: Medicare Other

## 2021-10-08 ENCOUNTER — Other Ambulatory Visit: Payer: Self-pay

## 2021-10-08 DIAGNOSIS — R55 Syncope and collapse: Secondary | ICD-10-CM | POA: Diagnosis not present

## 2021-10-08 DIAGNOSIS — I493 Ventricular premature depolarization: Secondary | ICD-10-CM | POA: Diagnosis not present

## 2021-10-08 LAB — ECHOCARDIOGRAM COMPLETE
AR max vel: 2.86 cm2
AV Area VTI: 2.79 cm2
AV Area mean vel: 2.66 cm2
AV Mean grad: 2 mmHg
AV Peak grad: 4 mmHg
Ao pk vel: 1 m/s
Area-P 1/2: 3.31 cm2
Calc EF: 63.3 %
S' Lateral: 2.66 cm
Single Plane A2C EF: 57.7 %
Single Plane A4C EF: 68.3 %

## 2021-10-09 DIAGNOSIS — I493 Ventricular premature depolarization: Secondary | ICD-10-CM | POA: Diagnosis not present

## 2021-10-09 DIAGNOSIS — R55 Syncope and collapse: Secondary | ICD-10-CM | POA: Diagnosis not present

## 2021-11-03 ENCOUNTER — Encounter: Payer: Self-pay | Admitting: Family Medicine

## 2021-11-03 ENCOUNTER — Other Ambulatory Visit: Payer: Self-pay

## 2021-11-03 ENCOUNTER — Ambulatory Visit (INDEPENDENT_AMBULATORY_CARE_PROVIDER_SITE_OTHER): Payer: Medicare Other | Admitting: Family Medicine

## 2021-11-03 VITALS — BP 110/70 | HR 67 | Temp 98.0°F | Ht 61.0 in | Wt 130.0 lb

## 2021-11-03 DIAGNOSIS — M8588 Other specified disorders of bone density and structure, other site: Secondary | ICD-10-CM

## 2021-11-03 DIAGNOSIS — R7309 Other abnormal glucose: Secondary | ICD-10-CM

## 2021-11-03 DIAGNOSIS — Z1231 Encounter for screening mammogram for malignant neoplasm of breast: Secondary | ICD-10-CM | POA: Diagnosis not present

## 2021-11-03 DIAGNOSIS — M8580 Other specified disorders of bone density and structure, unspecified site: Secondary | ICD-10-CM

## 2021-11-03 DIAGNOSIS — E7800 Pure hypercholesterolemia, unspecified: Secondary | ICD-10-CM

## 2021-11-03 DIAGNOSIS — M8589 Other specified disorders of bone density and structure, multiple sites: Secondary | ICD-10-CM | POA: Insufficient documentation

## 2021-11-03 LAB — POCT GLYCOSYLATED HEMOGLOBIN (HGB A1C)
HbA1c POC (<> result, manual entry): 4.8 % (ref 4.0–5.6)
HbA1c, POC (controlled diabetic range): 4.8 % (ref 0.0–7.0)
HbA1c, POC (prediabetic range): 4.8 % — AB (ref 5.7–6.4)
Hemoglobin A1C: 4.8 % (ref 4.0–5.6)

## 2021-11-03 MED ORDER — TETANUS-DIPHTH-ACELL PERTUSSIS 5-2.5-18.5 LF-MCG/0.5 IM SUSP: 0.5000 mL | Freq: Once | INTRAMUSCULAR | 0 refills | Status: AC

## 2021-11-03 NOTE — Progress Notes (Signed)
This visit occurred during the SARS-CoV-2 public health emergency.  Safety protocols were in place, including screening questions prior to the visit, additional usage of staff PPE, and extensive cleaning of exam room while observing appropriate contact time as indicated for disinfecting solutions.    Patient ID: Sara Doyle, female  DOB: 1946/05/03, 76 y.o.   MRN: [id number] Patient Care Team    Relationship Specialty Notifications Start End  Ma Hillock, DO PCP - General Family Medicine  05/10/15   Buford Dresser, MD PCP - Cardiology Cardiology  09/22/21   Milus Banister, MD Attending Physician Gastroenterology  10/05/16   Rutherford Guys, MD Consulting Physician Ophthalmology  10/05/16   Dickie La, MD Consulting Physician Family Medicine  10/11/17    Comment: (ortho)  Ellis Parents, DDS Referring Physician Dentistry  08/12/21     Chief Complaint  Patient presents with   Hyperlipidemia    Cpe; pt is not fasting    Subjective: Sara Doyle is a 76 y.o.  Female  present for [facility] All past medical history, surgical history, allergies, family history, immunizations, medications and social history were updated in the electronic medical record today. All recent labs, ED visits and hospitalizations within the last year were reviewed.  Osteopenia, unspecified location pt taking vit d supplement. T-score of -1.5 (04/19/2019) This patient is considered osteopenic. >> ordered  Elevated cholesterol/fhx heart disease in bother.  Has been diet controlled the past.  However her last LDL did increase to put her at a mild elevation in cardiac risk over normal population.  She attempted to start low-dose Crestor 5 mg every other Sara and was unable to tolerate secondary to myalgias.  Labs UTD 08/2021  Elevated glucose:  143 last glucose.    Depression screen [facility] 2/9 11/03/2021 08/12/2021 11/01/2020 10/30/2020 10/23/2020  Decreased Interest 0 0 0 0 0  Down, Depressed, Hopeless 0  0 0 0 0  PHQ - 2 Score 0 0 0 0 0   No flowsheet data found.   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   Dr Berenice Primas 05/2018   Muscle strain of thigh, right, initial encounter 01/27/2017   Vaginal dryness, menopausal 10/04/2015   Allergies  Allergen Reactions   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 and Swelling    Tongue swelling/difficulty breathing   Demerol [Meperidine] Nausea And Vomiting   Past Surgical History:  Procedure Laterality Date   BUNIONECTOMY Right    FRACTURE SURGERY  1969   right leg   FRACTURE SURGERY  2005   right leg   SKIN LESION EXCISION Right 07/08/2017   Behind right knee, benign   TIBIA FRACTURE SURGERY Right    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, '05   TOTAL HIP ARTHROPLASTY Right 05/20/2018   Procedure: RIGHT TOTAL HIP ARTHROPLASTY ANTERIOR APPROACH;  Surgeon: [name], MD;  Location: [facility];  Service: Orthopedics;  Laterality: Right;   TUBAL LIGATION     Family History  Problem Relation Age of Onset   Hypertension Mother  Arthritis Father        osteo   Heart disease Brother        congenital   Breast cancer Sister 77       breast   Arthritis Sister    Hypertension Sister    Heart disease Brother    Cancer Brother        prostate, colon   Arrhythmia Brother        a-fib   Heart disease Brother    Colon cancer Neg Hx    Esophageal cancer Neg Hx    Rectal cancer Neg  Hx    Stomach cancer Neg Hx    Social History   Social History Narrative   Ms. Volkert lives with her husband, She relocated from [place] Nov., 2015. She is retired, has 3 grown sons that live in [place].   Smoke alarm in the home, wears her seatbelt, uses sunscreen.   Ambulates independently. No dentures.   Has dentist with regular appts.    Feels safe in her relationships    Allergies as of 11/03/2021       Reactions   Oysters [shellfish Allergy] Anaphylaxis, Swelling   Tongue swelling/difficulty breathing   Demerol [meperidine] Nausea And Vomiting        Medication List        Accurate as of November 03, 2021  1:34 PM. If you have any questions, ask your nurse or doctor.          b complex vitamins tablet Take 1 tablet by mouth daily.   OVER THE COUNTER MEDICATION Areds preservision one tablet daily.   Oyster Shell Calcium/D 250-125 MG-UNIT Tabs Take by mouth.   Tdap 5-2.5-18.5 LF-MCG/0.5 injection Commonly known as: BOOSTRIX Inject 0.5 mLs into the muscle once for 1 dose. Started by: Howard Pouch, DO        All past medical history, surgical history, allergies, family history, immunizations andmedications were updated in the EMR today and reviewed under the history and medication portions of their EMR.      ROS: 14 pt review of systems performed and negative (unless mentioned in an HPI)  Objective: BP 110/70    Pulse 67    Temp 98 F (36.7 C) (Oral)    Ht 5\' 1"  (1.549 m)    Wt 130 lb (59 kg)    SpO2 96%    BMI 24.56 kg/m  Physical Exam Vitals and nursing note reviewed.  Constitutional:      General: She is not in acute distress.    Appearance: Normal appearance. She is not ill-appearing, toxic-appearing or diaphoretic.  HENT:     Head: Normocephalic and atraumatic.  Eyes:     General: No scleral icterus.       Right eye: No discharge.        Left eye: No discharge.     Extraocular Movements: Extraocular movements intact.      Conjunctiva/sclera: Conjunctivae normal.     Pupils: Pupils are equal, round, and reactive to light.  Cardiovascular:     Rate and Rhythm: Normal rate and regular rhythm.     Heart sounds: No murmur heard.   No gallop.  Pulmonary:     Effort: Pulmonary effort is normal. No respiratory distress.     Breath sounds: Normal breath sounds. No wheezing, rhonchi or rales.  Musculoskeletal:     Cervical back: Neck supple. No tenderness.  Lymphadenopathy:     Cervical: No cervical adenopathy.  Skin:  General: Skin is warm and dry.     Coloration: Skin is not jaundiced or pale.     Findings: No erythema or rash.  Neurological:     Mental Status: She is alert and oriented to person, place, and time. Mental status is at baseline.     Motor: No weakness.     Gait: Gait normal.  Psychiatric:        Mood and Affect: Mood normal.        Behavior: Behavior normal.        Thought Content: Thought content normal.        Judgment: Judgment normal.    No results found.  Assessment/plan: Sara Doyle is a 76 y.o. female present for [facility] Osteopenia, unspecified location Bone density is ordered due 04/19/2021 Continue vit d supplement.  Exercise routinely.former smoker.   Elevated glucose: A1c > 4.8 today  Tdap:  She thinks she had this at her pharmacy last summer, she will check.  Tdap script printed just in case.   Hyperlipidemia: Unable to tolerate even low-dose every other Sara Crestor. Lengthy discussion today surrounding increasing exercise greater than 150 minutes week of cardiovascular exercise with optimal heart rate during exercise. Discussed dietary modifications in detail today limiting butters-may replace with plant-based butters.   Using all of or canola oil only, which she already has been doing. Limiting red meat consumption to 1 times a week or less. Avoiding fatty preparations of meat.  Removing skin from meat. More fresh fruits and vegetables, added fiber. Do not  feel she needs to restart statin at this time or try different medication.  Overall her cholesterol panel was pretty good.  We will just adding on a statin to provide her with extra cardiovascular protection for her family history.  Breast cancer screening: Declined mammogram this year. Would like to have every other year  Return in about 1 year (around 11/04/2022) for [place] (30 min).  Orders Placed This Encounter  Procedures   DG Bone Density   POCT HgB A1C   Meds ordered this encounter  Medications   Tdap (BOOSTRIX) 5-2.5-18.5 LF-MCG/0.5 injection    Sig: Inject 0.5 mLs into the muscle once for 1 dose.    Dispense:  0.5 mL    Refill:  0   Referral Orders  No referral(s) requested today     Electronically signed by: Howard Pouch, [place]

## 2021-11-03 NOTE — Patient Instructions (Addendum)
°  A1c was normal. No concerns for diabetes.  Follow up in 1 yr for chronic conditions

## 2021-11-05 ENCOUNTER — Other Ambulatory Visit: Payer: Self-pay

## 2021-11-05 ENCOUNTER — Ambulatory Visit (INDEPENDENT_AMBULATORY_CARE_PROVIDER_SITE_OTHER): Payer: Medicare Other

## 2021-11-05 DIAGNOSIS — Z0000 Encounter for general adult medical examination without abnormal findings: Secondary | ICD-10-CM

## 2021-11-05 NOTE — Patient Instructions (Signed)
[name] , Thank you for taking time to come for your Medicare Wellness Visit. I appreciate your ongoing commitment to your health goals. Please review the following plan we discussed and let me know if I can assist you in the future.   Screening recommendations/referrals: Colonoscopy: No longer required  Mammogram: one 04/16/21 repeat every year Recommended yearly ophthalmology/optometry visit for glaucoma screening and checkup Recommended yearly dental visit for hygiene and checkup  Vaccinations: Influenza vaccine: Done 06/07/21 repeat every year Pneumococcal vaccine: Up to date Tdap vaccine: Done 03/07/21 repeat every year  Shingles vaccine: Completed 4/26, 04/12/20   Covid-19:Completed 1/20, 2/8, 07/02/20 & 6/3, 06/10/21  Advanced directives: Copies in chart  Conditions/risks identified: none at this time   Next appointment: Follow up in one year for your annual wellness visit    Preventive Care 65 Years and Older, Female Preventive care refers to lifestyle choices and visits with your health care provider that can promote health and wellness. What does preventive care include? A yearly physical exam. This is also called an annual well check. Dental exams once or twice a year. Routine eye exams. Ask your health care provider how often you should have your eyes checked. Personal lifestyle choices, including: Daily care of your teeth and gums. Regular physical activity. Eating a healthy diet. Avoiding tobacco and drug use. Limiting alcohol use. Practicing safe sex. Taking low-dose aspirin every day. Taking vitamin and mineral supplements as recommended by your health care provider. What happens during an annual well check? The services and screenings done by your health care provider during your annual well check will depend on your age, overall health, lifestyle risk factors, and family history of disease. Counseling  Your health care provider may ask you questions about  your: Alcohol use. Tobacco use. Drug use. Emotional well-being. Home and relationship well-being. Sexual activity. Eating habits. History of falls. Memory and ability to understand (cognition). Work and work Statistician. Reproductive health. Screening  You may have the following tests or measurements: Height, weight, and BMI. Blood pressure. Lipid and cholesterol levels. These may be checked every 5 years, or more frequently if you are over 57 years old. Skin check. Lung cancer screening. You may have this screening every year starting at age 15 if you have a 30-pack-year history of smoking and currently smoke or have quit within the past 15 years. Fecal occult blood test (FOBT) of the stool. You may have this test every year starting at age 71. Flexible sigmoidoscopy or colonoscopy. You may have a sigmoidoscopy every 5 years or a colonoscopy every 10 years starting at age 3. Hepatitis C blood test. Hepatitis B blood test. Sexually transmitted disease (STD) testing. Diabetes screening. This is done by checking your blood sugar (glucose) after you have not eaten for a while (fasting). You may have this done every 1-3 years. Bone density scan. This is done to screen for osteoporosis. You may have this done starting at age 82. Mammogram. This may be done every 1-2 years. Talk to your health care provider about how often you should have regular mammograms. Talk with your health care provider about your test results, treatment options, and if necessary, the need for more tests. Vaccines  Your health care provider may recommend certain vaccines, such as: Influenza vaccine. This is recommended every year. Tetanus, diphtheria, and acellular pertussis (Tdap, Td) vaccine. You may need a Td booster every 10 years. Zoster vaccine. You may need this after age 77. Pneumococcal 13-valent conjugate (PCV13) vaccine. One  dose is recommended after age 87. Pneumococcal polysaccharide (PPSV23) vaccine.  One dose is recommended after age 55. Talk to your health care provider about which screenings and vaccines you need and how often you need them. This information is not intended to replace advice given to you by your health care provider. Make sure you discuss any questions you have with your health care provider. Document Released: 09/20/2015 Document Revised: 05/13/2016 Document Reviewed: 06/25/2015 Elsevier Interactive Patient Education  2017 [place] Prevention in the Home Falls can cause injuries. They can happen to people of all ages. There are many things you can do to make your home safe and to help prevent falls. What can I do on the outside of my home? Regularly fix the edges of walkways and driveways and fix any cracks. Remove anything that might make you trip as you walk through a door, such as a raised step or threshold. Trim any bushes or trees on the path to your home. Use bright outdoor lighting. Clear any walking paths of anything that might make someone trip, such as rocks or tools. Regularly check to see if handrails are loose or broken. Make sure that both sides of any steps have handrails. Any raised decks and porches should have guardrails on the edges. Have any leaves, snow, or ice cleared regularly. Use sand or salt on walking paths during winter. Clean up any spills in your garage right away. This includes oil or grease spills. What can I do in the bathroom? Use night lights. Install grab bars by the toilet and in the tub and shower. Do not use towel bars as grab bars. Use non-skid mats or decals in the tub or shower. If you need to sit down in the shower, use a plastic, non-slip stool. Keep the floor dry. Clean up any water that spills on the floor as soon as it happens. Remove soap buildup in the tub or shower regularly. Attach bath mats securely with double-sided non-slip rug tape. Do not have throw rugs and other things on the floor that can make  you trip. What can I do in the bedroom? Use night lights. Make sure that you have a light by your bed that is easy to reach. Do not use any sheets or blankets that are too big for your bed. They should not hang down onto the floor. Have a firm chair that has side arms. You can use this for support while you get dressed. Do not have throw rugs and other things on the floor that can make you trip. What can I do in the kitchen? Clean up any spills right away. Avoid walking on wet floors. Keep items that you use a lot in easy-to-reach places. If you need to reach something above you, use a strong step stool that has a grab bar. Keep electrical cords out of the way. Do not use floor polish or wax that makes floors slippery. If you must use wax, use non-skid floor wax. Do not have throw rugs and other things on the floor that can make you trip. What can I do with my stairs? Do not leave any items on the stairs. Make sure that there are handrails on both sides of the stairs and use them. Fix handrails that are broken or loose. Make sure that handrails are as long as the stairways. Check any carpeting to make sure that it is firmly attached to the stairs. Fix any carpet that is loose or worn.  Avoid having throw rugs at the top or bottom of the stairs. If you do have throw rugs, attach them to the floor with carpet tape. Make sure that you have a light switch at the top of the stairs and the bottom of the stairs. If you do not have them, ask someone to add them for you. What else can I do to help prevent falls? Wear shoes that: Do not have high heels. Have rubber bottoms. Are comfortable and fit you well. Are closed at the toe. Do not wear sandals. If you use a stepladder: Make sure that it is fully opened. Do not climb a closed stepladder. Make sure that both sides of the stepladder are locked into place. Ask someone to hold it for you, if possible. Clearly mark and make sure that you can  see: Any grab bars or handrails. First and last steps. Where the edge of each step is. Use tools that help you move around (mobility aids) if they are needed. These include: Canes. Walkers. Scooters. Crutches. Turn on the lights when you go into a dark area. Replace any light bulbs as soon as they burn out. Set up your furniture so you have a clear path. Avoid moving your furniture around. If any of your floors are uneven, fix them. If there are any pets around you, be aware of where they are. Review your medicines with your doctor. Some medicines can make you feel dizzy. This can increase your chance of falling. Ask your doctor what other things that you can do to help prevent falls. This information is not intended to replace advice given to you by your health care provider. Make sure you discuss any questions you have with your health care provider. Document Released: 06/20/2009 Document Revised: 01/30/2016 Document Reviewed: 09/28/2014 Elsevier Interactive Patient Education  2017 [employer].

## 2021-11-05 NOTE — Progress Notes (Addendum)
Virtual Visit via Telephone Note  I connected with  Sara Doyle on 11/05/21 at  1:45 PM EST by telephone and verified that I am speaking with the correct person using two identifiers.  Medicare Annual Wellness visit completed telephonically due to Covid-19 pandemic.   Persons participating in this call: This Health Coach and this patient.   Location: Patient: Home Provider: Office   I discussed the limitations, risks, security and privacy concerns of performing an evaluation and management service by telephone and the availability of in person appointments. The patient expressed understanding and agreed to proceed.  Unable to perform video visit due to video visit attempted and failed and/or patient does not have video capability.   Some vital signs may be absent or patient reported.   Willette Brace, LPN   Subjective:   Sara Doyle is a 76 y.o. female who presents for Medicare Annual (Subsequent) preventive examination.  Review of Systems     Cardiac Risk Factors include: advanced age (>20men, >51 women)     Objective:    There were no vitals filed for this visit. There is no height or weight on file to calculate BMI.  Advanced Directives 11/05/2021 10/30/2020 10/20/2019 10/14/2018 05/20/2018 05/12/2018 10/11/2017  Does Patient Have a Medical Advance Directive? Yes Yes Yes Yes No No No  Type of Academic librarian Living will [place];Living will;Out of facility DNR (pink MOST or yellow form) [place];Living will - - -  Does patient want to make changes to medical advance directive? - - Yes (ED - Information included in AVS) - - - -  Copy of [place] in Chart? Yes - validated most recent copy scanned in chart (See row information) - No - copy requested Yes - validated most recent copy scanned in chart (See row information) - - -  Would patient like information on creating a medical advance  directive? - - - - No - Patient declined No - Patient declined No - Patient declined    Current Medications (verified) Outpatient Encounter Medications as of 11/05/2021  Medication Sig   b complex vitamins tablet Take 1 tablet by mouth daily.   Calcium Carb-Cholecalciferol (CALCIUM 500/D PO) Take by mouth.   OVER THE COUNTER MEDICATION Areds preservision one tablet daily.   [DISCONTINUED] Calcium Carbonate-Vitamin D (OYSTER SHELL CALCIUM/D) 250-125 MG-UNIT TABS Take by mouth.   No facility-administered encounter medications on file as of 11/05/2021.    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   Dr Berenice Primas 05/2018   Muscle strain of thigh, right, initial encounter 01/27/2017   Vaginal dryness, menopausal 10/04/2015   Past Surgical History:  Procedure Laterality Date   BUNIONECTOMY Right    FRACTURE SURGERY  1969   right leg   FRACTURE SURGERY  2005   right leg   SKIN LESION EXCISION Right 07/08/2017   Behind right knee, benign   TIBIA FRACTURE SURGERY Right    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, '05   [place] Right 05/20/2018   Procedure: RIGHT TOTAL HIP ARTHROPLASTY ANTERIOR APPROACH;  Surgeon: [name], MD;  Location: [facility];  Service: Orthopedics;  Laterality: Right;   TUBAL LIGATION     Family History  Problem Relation Age of Onset   Hypertension Mother    Arthritis Father  osteo   Heart disease Brother        congenital   Breast cancer Sister 68       breast   Arthritis Sister    Hypertension Sister    Heart disease Brother    Cancer Brother        prostate, colon   Arrhythmia Brother        a-fib   Heart disease Brother    Colon cancer Neg Hx    Esophageal cancer Neg Hx    Rectal cancer Neg Hx    Stomach cancer Neg Hx    Social History   Socioeconomic History   Marital status: Married    Spouse name:  Not on file   Number of children: 3   Years of education: Not on file   Highest education level: Bachelor's degree (e.g., BA, AB, BS)  Occupational History    Comment: retired  Tobacco Use   Smoking status: Former    Packs/day: 1.00    Years: 25.00    Pack years: 25.00    Types: Cigarettes    Quit date: 09/07/1993    Years since quitting: 28.1   Smokeless tobacco: Never  Vaping Use   Vaping Use: Never used  Substance and Sexual Activity   Alcohol use: Yes    Alcohol/week: 4.0 standard drinks    Types: 4 Glasses of wine per week    Comment: 4-5 glasses/week   Drug use: No   Sexual activity: Yes    Birth control/protection: Post-menopausal  Other Topics Concern   Not on file  Social History Narrative   Ms. Foell lives with her husband, She relocated from [place] Nov., 2015. She is retired, has 3 grown sons that live in [place].   Smoke alarm in the home, wears her seatbelt, uses sunscreen.   Ambulates independently. No dentures.   Has dentist with regular appts.    Feels safe in her relationships   Social Determinants of Health   Financial Resource Strain: Low Risk    Difficulty of Paying Living Expenses: Not hard at all  Food Insecurity: No Food Insecurity   Worried About Charity fundraiser in the Last Year: Never true   Ran Out of Food in the Last Year: Never true  Transportation Needs: No Transportation Needs   Lack of Transportation (Medical): No   Lack of Transportation (Non-Medical): No  Physical Activity: Sufficiently Active   Days of Exercise per Week: 3 days   Minutes of Exercise per Session: 60 min  Stress: No Stress Concern Present   Feeling of Stress : Not at all  Social Connections: Moderately Integrated   Frequency of Communication with Friends and Family: Twice a week   Frequency of Social Gatherings with Friends and Family: Three times a week   Attends Religious Services: Never   Active Member of Clubs or Organizations: Yes   Attends Theatre manager Meetings: 1 to 4 times per year   Marital Status: Married    Tobacco Counseling Counseling given: Not Answered   Clinical Intake:  Pre-visit preparation completed: Yes  Pain : No/denies pain     BMI - recorded: 24.58 Nutritional Status: BMI of 19-24  Normal Nutritional Risks: None Diabetes: No  How often do you need to have someone help you when you read instructions, pamphlets, or other written materials from your doctor or pharmacy?: 1 - Never  Diabetic?No  Interpreter Needed?: No  Information entered by :: Charlott Rakes, LPN  Activities of Daily Living In your present state of health, do you have any difficulty performing the following activities: 11/05/2021  Hearing? N  Vision? N  Difficulty concentrating or making decisions? N  Walking or climbing stairs? Y  Comment with knee at times  Dressing or bathing? N  Doing errands, shopping? N  Preparing Food and eating ? N  Using the Toilet? N  In the past six months, have you accidently leaked urine? N  Do you have problems with loss of bowel control? Y  Comment loose stools during covid  Managing your Medications? N  Managing your Finances? N  Housekeeping or managing your Housekeeping? N  Some recent data might be hidden    Patient Care Team: Ma Hillock, DO as PCP - General (Family Medicine) Buford Dresser, MD as PCP - Cardiology (Cardiology) Milus Banister, MD as Attending Physician (Gastroenterology) Rutherford Guys, MD as Consulting Physician (Ophthalmology) Dickie La, MD as Consulting Physician (Family Medicine) Ellis Parents, DDS as Referring Physician (Dentistry)  Indicate any recent Medical Services you may have received from other than [facility] providers in the past year (date may be approximate).     Assessment:   This is a routine wellness examination for [place].  Hearing/Vision screen Hearing Screening - Comments:: Pt denies any hearing issues  Vision Screening  - Comments:: Pt follows up with Dr Katy Fitch For annual eye exams   Dietary issues and exercise activities discussed: Current Exercise Habits: Home exercise routine, Type of exercise: strength training/weights;Other - see comments;walking, Time (Minutes): 60, Frequency (Times/Week): 3, Weekly Exercise (Minutes/Week): 180   Goals Addressed             This Visit's Progress    Patient Stated       None at this time        Depression Screen PHQ 2/9 Scores 11/05/2021 11/03/2021 08/12/2021 11/01/2020 10/30/2020 10/23/2020 10/20/2019  PHQ - 2 Score 0 0 0 0 0 0 0    Fall Risk Fall Risk  11/05/2021 08/08/2021 11/01/2020 10/30/2020 10/23/2020  Falls in the past year? 1 1 0 1 0  Number falls in past yr: 1 0 0 1 0  Comment hiking - - - -  Injury with Fall? 1 0 0 0 0  Comment no injury - - - -  Risk for fall due to : Impaired vision - - History of fall(s) -  Follow up Falls prevention discussed - - Falls prevention discussed Falls evaluation completed    FALL RISK PREVENTION PERTAINING TO THE HOME:  Any stairs in or around the home? Yes  If so, are there any without handrails? No  Home free of loose throw rugs in walkways, pet beds, electrical cords, etc? Yes  Adequate lighting in your home to reduce risk of falls? Yes   ASSISTIVE DEVICES UTILIZED TO PREVENT FALLS:  Life alert? No  Use of a cane, walker or w/c? No  Grab bars in the bathroom? Yes  Shower chair or bench in shower? Yes  Elevated toilet seat or a handicapped toilet? No   TIMED UP AND GO:  Was the test performed? No .   Cognitive Function: MMSE - Mini Mental State Exam 10/14/2018 10/11/2017  Orientation to time 5 5  Orientation to Place 5 5  Registration 3 3  Attention/ Calculation 5 5  Recall 2 3  Language- name 2 objects 2 2  Language- repeat 1 1  Language- follow 3 step command 3 3  Language- read &  follow direction 1 1  Write a sentence 1 1  Copy design 1 1  Total score 29 30     6CIT Screen 11/05/2021  What Year? 0  points  What month? 0 points  What time? 0 points  Count back from 20 0 points  Months in reverse 0 points  Repeat phrase 0 points  Total Score 0    Immunizations Immunization History  Administered Date(s) Administered   Influenza, High Dose Seasonal PF 07/04/2018, 06/17/2019   Influenza-Unspecified 06/23/2015, 06/21/2016, 06/07/2017, 07/24/2020, 06/07/2021   PFIZER(Purple Top)SARS-COV-2 Vaccination 09/27/2019, 10/16/2019, 07/02/2020, 02/07/2021   Pfizer Covid-19 Vaccine Bivalent Booster 53yrs & up 06/10/2021   Pneumococcal Conjugate-13 02/12/2014   Pneumococcal Polysaccharide-23 01/06/2011   Tdap 01/06/2003, 03/07/2021   Zoster Recombinat (Shingrix) 01/01/2020, 04/12/2020    TDAP status: Up to date  Flu Vaccine status: Up to date  Pneumococcal vaccine status: Up to date  Covid-19 vaccine status: Completed vaccines  Qualifies for Shingles Vaccine? Yes   Zostavax completed Yes   Shingrix Completed?: Yes  Screening Tests Health Maintenance  Topic Date Due   DEXA SCAN  04/18/2022   MAMMOGRAM  04/17/2023   TETANUS/TDAP  03/08/2031   Pneumonia Vaccine 73+ Years old  Completed   INFLUENZA VACCINE  Completed   COVID-19 Vaccine  Completed   Hepatitis C Screening  Completed   Zoster Vaccines- Shingrix  Completed   HPV VACCINES  Aged Out   COLONOSCOPY (Pts 45-59yrs Insurance coverage will need to be confirmed)  Discontinued    Health Maintenance  There are no preventive care reminders to display for this patient.  Colorectal cancer screening: No longer required.   Mammogram status: Completed 04/16/21. Repeat every year  Bone Density status: Completed 04/19/19. Results reflect: Bone density results: OSTEOPENIA. Repeat every 3 years.   Additional Screening:  Hepatitis C Screening:  Completed 10/12/16  Vision Screening: Recommended annual ophthalmology exams for early detection of glaucoma and other disorders of the eye. Is the patient up to date with their annual eye  exam?  Yes  Who is the provider or what is the name of the office in which the patient attends annual eye exams? Dr Katy Fitch If pt is not established with a provider, would they like to be referred to a provider to establish care? No .   Dental Screening: Recommended annual dental exams for proper oral hygiene  Community Resource Referral / Chronic Care Management: CRR required this visit?  No   CCM required this visit?  No      Plan:     I have personally reviewed and noted the following in the patients chart:   Medical and social history Use of alcohol, tobacco or illicit drugs  Current medications and supplements including opioid prescriptions.  Functional ability and status Nutritional status Physical activity Advanced directives List of other physicians Hospitalizations, surgeries, and ER visits in previous 12 months Vitals Screenings to include cognitive, depression, and falls Referrals and appointments  In addition, I have reviewed and discussed with patient certain preventive protocols, quality metrics, and best practice recommendations. A written personalized care plan for preventive services as well as general preventive health recommendations were provided to patient.     Willette Brace, LPN   03/11/9162   Nurse Notes: None

## 2021-11-10 ENCOUNTER — Encounter: Payer: Self-pay | Admitting: Family Medicine

## 2021-11-12 ENCOUNTER — Encounter (HOSPITAL_BASED_OUTPATIENT_CLINIC_OR_DEPARTMENT_OTHER): Payer: Self-pay

## 2021-11-12 NOTE — Telephone Encounter (Signed)
Please advise if this is okay?

## 2021-11-12 NOTE — Telephone Encounter (Signed)
We can provide her with a medical clearance to exercise.  However, she should make sure her cardiologist feels that it is also safe.  She had ZIO monitoring completed a few weeks ago through cardiology.  Those results would clear her, or not, from the cardiology standpoint. ?

## 2021-12-08 NOTE — Telephone Encounter (Signed)
This has been sent

## 2021-12-15 DIAGNOSIS — M25561 Pain in right knee: Secondary | ICD-10-CM | POA: Diagnosis not present

## 2022-01-05 DIAGNOSIS — M1711 Unilateral primary osteoarthritis, right knee: Secondary | ICD-10-CM | POA: Diagnosis not present

## 2022-01-08 ENCOUNTER — Encounter: Payer: Self-pay | Admitting: Family Medicine

## 2022-01-08 ENCOUNTER — Encounter (INDEPENDENT_AMBULATORY_CARE_PROVIDER_SITE_OTHER): Payer: Medicare Other | Admitting: Family Medicine

## 2022-01-08 DIAGNOSIS — Z7185 Encounter for immunization safety counseling: Secondary | ICD-10-CM

## 2022-01-08 NOTE — Progress Notes (Signed)
?  Cumulative Time: 5 minutes ?Consent: Pt reached out via MyChart ?People Involved: ? ?POE:UMPNTIR S Sara Doyle is a 76 y.o. female requesting recommendations on vaccination.  She had received her COVID bivalent booster in October 2022.  She plans to travel within the next 10-14 days and would like to know if she should get a second COVID by valent booster. ?AP: ?FDA and CDC now recommends an additional bivalent booster dose for those 65 and older, and for those with immunocompromising conditions.  ?As for timing, older adults, can receive it at least four months after their last dose. ? ? ?Please see the MyChart message reply(ies) for my assessment and plan. ? ?The patient gave consent for this Medical Advice Message and is aware that it may result in a bill to their insurance company as well as the possibility that this may result in a co-payment or deductible. They are an established patient, but are not seeking medical advice exclusively about a problem treated during an in person or video visit in the last 7 days. I did not recommend an in person or video visit within 7 days of my reply. ? ?I spent a total of 5 minutes cumulative time within 7 days through [employer] ?Howard Pouch, DO ? ?

## 2022-01-08 NOTE — Telephone Encounter (Signed)
Please advise if pt should receive an add'l BV COVID booster.  ?

## 2022-01-09 DIAGNOSIS — Z23 Encounter for immunization: Secondary | ICD-10-CM | POA: Diagnosis not present

## 2022-01-12 DIAGNOSIS — M1711 Unilateral primary osteoarthritis, right knee: Secondary | ICD-10-CM | POA: Diagnosis not present

## 2022-02-12 DIAGNOSIS — M1711 Unilateral primary osteoarthritis, right knee: Secondary | ICD-10-CM | POA: Diagnosis not present

## 2022-04-17 ENCOUNTER — Ambulatory Visit
Admission: RE | Admit: 2022-04-17 | Discharge: 2022-04-17 | Disposition: A | Discharge status: Home / Self Care | Payer: Medicare Other | Source: Ambulatory Visit | Attending: Family Medicine | Admitting: Family Medicine

## 2022-04-17 DIAGNOSIS — M85832 Other specified disorders of bone density and structure, left forearm: Secondary | ICD-10-CM | POA: Diagnosis not present

## 2022-04-17 DIAGNOSIS — M8580 Other specified disorders of bone density and structure, unspecified site: Secondary | ICD-10-CM

## 2022-04-17 DIAGNOSIS — M8588 Other specified disorders of bone density and structure, other site: Secondary | ICD-10-CM

## 2022-04-17 DIAGNOSIS — Z780 Asymptomatic menopausal state: Secondary | ICD-10-CM | POA: Diagnosis not present

## 2022-04-27 ENCOUNTER — Telehealth: Payer: Self-pay

## 2022-04-27 DIAGNOSIS — Z0279 Encounter for issue of other medical certificate: Secondary | ICD-10-CM

## 2022-04-27 NOTE — Telephone Encounter (Signed)
Type of forms received: General Medical Information for [employer] [facility] (departure 08/14/22)   Routed to:  Team [facility]  Paperwork received by :  Hinton Dyer   Individual made aware of 3-5 business day turn around (Y/N): yes  Form completed and patient made aware of charges(Y/N): yes  Last PCP OV:  11/03/2021 Patient may have to schedule OV prior to provider completing form.   Form placed in [place] 04/27/22

## 2022-04-28 NOTE — Telephone Encounter (Signed)
Office info completed and placed on PCP desk.

## 2022-04-29 NOTE — Telephone Encounter (Signed)
Form completed.

## 2022-04-30 NOTE — Telephone Encounter (Signed)
Pt husband informed

## 2022-06-01 ENCOUNTER — Other Ambulatory Visit (HOSPITAL_BASED_OUTPATIENT_CLINIC_OR_DEPARTMENT_OTHER): Payer: Self-pay

## 2022-06-01 DIAGNOSIS — Z23 Encounter for immunization: Secondary | ICD-10-CM | POA: Diagnosis not present

## 2022-06-01 MED ORDER — INFLUENZA VAC A&B SA ADJ QUAD 0.5 ML IM PRSY
PREFILLED_SYRINGE | INTRAMUSCULAR | 0 refills | Status: DC
  Filled 2022-06-01: qty 0.5, 1d supply, fill #0

## 2022-06-02 ENCOUNTER — Encounter: Payer: Self-pay | Admitting: Family Medicine

## 2022-06-02 DIAGNOSIS — H2513 Age-related nuclear cataract, bilateral: Secondary | ICD-10-CM | POA: Diagnosis not present

## 2022-06-02 DIAGNOSIS — H35372 Puckering of macula, left eye: Secondary | ICD-10-CM | POA: Diagnosis not present

## 2022-06-02 NOTE — Telephone Encounter (Signed)
Chart updated

## 2022-06-25 DIAGNOSIS — M25561 Pain in right knee: Secondary | ICD-10-CM | POA: Diagnosis not present

## 2022-07-07 DIAGNOSIS — M25561 Pain in right knee: Secondary | ICD-10-CM | POA: Diagnosis not present

## 2022-07-20 ENCOUNTER — Encounter: Payer: Self-pay | Admitting: Family Medicine

## 2022-07-20 ENCOUNTER — Ambulatory Visit (INDEPENDENT_AMBULATORY_CARE_PROVIDER_SITE_OTHER): Payer: Medicare Other | Admitting: Family Medicine

## 2022-07-20 VITALS — BP 119/80 | HR 55 | Temp 98.1°F | Wt 128.0 lb

## 2022-07-20 DIAGNOSIS — J988 Other specified respiratory disorders: Secondary | ICD-10-CM | POA: Diagnosis not present

## 2022-07-20 DIAGNOSIS — R051 Acute cough: Secondary | ICD-10-CM

## 2022-07-20 DIAGNOSIS — B9689 Other specified bacterial agents as the cause of diseases classified elsewhere: Secondary | ICD-10-CM | POA: Diagnosis not present

## 2022-07-20 LAB — POCT INFLUENZA A/B
Influenza A, POC: NEGATIVE
Influenza B, POC: NEGATIVE

## 2022-07-20 LAB — POC COVID19 BINAXNOW: SARS Coronavirus 2 Ag: NEGATIVE

## 2022-07-20 MED ORDER — PREDNISONE 20 MG PO TABS: ORAL_TABLET | ORAL | 0 refills | Status: DC

## 2022-07-20 MED ORDER — AMOXICILLIN-POT CLAVULANATE 875-125 MG PO TABS: 1.0000 | ORAL_TABLET | Freq: Two times a day (BID) | ORAL | 0 refills | Status: DC

## 2022-07-20 NOTE — Progress Notes (Signed)
Sara Doyle , 1946-04-28, 76 y.o., female MRN: [id number] Patient Care Team    Relationship Specialty Notifications Start End  Ma Hillock, DO PCP - General Family Medicine  05/10/15   Milus Banister, MD Attending Physician Gastroenterology  10/05/16   Rutherford Guys, MD Consulting Physician Ophthalmology  10/05/16   Ellis Parents, DDS Referring Physician Dentistry  08/12/21   Buford Dresser, MD Consulting Physician Cardiology  04/29/22   Warden Fillers, MD Consulting Physician Ophthalmology  04/29/22   Berenice Primas, MD Referring Physician Orthopedic Surgery  04/29/22     Chief Complaint  Patient presents with   Cough    Started 2-3 weeks ago clear mucus     Subjective: Pt presents for an OV with complaints of cough clear phlegm, persistent and not improving. Deep breath causes cough.  No fever or chills. Exposed to ill family members after traveling.  Sore throat at onset> resolved Tolerating PO    11/05/2021    1:50 PM 11/03/2021    1:03 PM 08/12/2021    8:13 AM 11/01/2020    1:06 PM 10/30/2020    3:09 PM  Depression screen PHQ 2/9  Decreased Interest 0 0 0 0 0  Down, Depressed, Hopeless 0 0 0 0 0  PHQ - 2 Score 0 0 0 0 0    Allergies  Allergen Reactions   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 and Swelling    Tongue swelling/difficulty breathing   Demerol [Meperidine] Nausea And Vomiting   Social History   Social History Narrative   Ms. Ruttan lives with her husband, She relocated from [place] Nov., 2015. She is retired, has 3 grown sons that live in [place].   Smoke alarm in the home, wears her seatbelt, uses sunscreen.   Ambulates independently. No dentures.   Has dentist with regular appts.    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   Dr Berenice Primas 05/2018   Muscle strain of thigh, right, initial encounter 01/27/2017   Vaginal dryness,  menopausal 10/04/2015   Past Surgical History:  Procedure Laterality Date   BUNIONECTOMY Right    FRACTURE SURGERY  1969   right leg   FRACTURE SURGERY  2005   right leg   SKIN LESION EXCISION Right 07/08/2017   Behind right knee, benign   TIBIA FRACTURE SURGERY Right    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, '05   TOTAL HIP ARTHROPLASTY Right 05/20/2018   Procedure: RIGHT TOTAL HIP ARTHROPLASTY ANTERIOR APPROACH;  Surgeon: [name], MD;  Location: [facility];  Service: Orthopedics;  Laterality: Right;   TUBAL LIGATION     Family History  Problem Relation Age of Onset   Hypertension Mother    Arthritis Father        osteo   Heart disease Brother        congenital   Breast cancer Sister 8       breast   Arthritis Sister    Hypertension Sister    Heart disease Brother    Cancer Brother        prostate, colon   Arrhythmia Brother        a-fib   Heart disease Brother    Colon cancer Neg Hx    Esophageal cancer Neg Hx    Rectal cancer Neg Hx    Stomach cancer Neg Hx    Allergies as  of 07/20/2022       Reactions   Oysters [shellfish Allergy] Anaphylaxis, Swelling   Tongue swelling/difficulty breathing   Demerol [meperidine] Nausea And Vomiting        Medication List        Accurate as of July 20, 2022  9:02 AM. If you have any questions, ask your nurse or doctor.          STOP taking these medications    Fluad Quadrivalent 0.5 ML injection Generic drug: influenza vaccine adjuvanted Stopped by: Howard Pouch, DO       TAKE these medications    amoxicillin-clavulanate 875-125 MG tablet Commonly known as: AUGMENTIN Take 1 tablet by mouth 2 (two) times daily. Started by: Howard Pouch, DO   b complex vitamins tablet Take 1 tablet by mouth daily.   CALCIUM 500/D PO Take by mouth.   OVER THE COUNTER MEDICATION Areds preservision one tablet daily.   predniSONE 20 MG tablet Commonly known as: DELTASONE 60 mg x3d, 40 mg x3d, 20 mg x2d, 10 mg x2d Started by: Howard Pouch, DO        All past medical history, surgical history, allergies, family history, immunizations andmedications were updated in the EMR today and reviewed under the history and medication portions of their EMR.     Review of Systems  Constitutional:  Positive for malaise/fatigue. Negative for chills and fever.  HENT:  Positive for congestion and sore throat. Negative for ear discharge, ear pain and sinus pain.   Respiratory:  Positive for cough and sputum production.   Musculoskeletal:  Negative for myalgias.  Skin:  Negative for rash.  Neurological:  Negative for dizziness and headaches.   Negative, with the exception of above mentioned in HPI   Objective:  BP 119/80   Pulse (!) 55   Temp 98.1 F (36.7 C)   Wt 128 lb (58.1 kg)   SpO2 97%   BMI 24.19 kg/m  Body mass index is 24.19 kg/m. Physical Exam Vitals and nursing note reviewed.  Constitutional:      General: She is not in acute distress.    Appearance: Normal appearance. She is not ill-appearing, toxic-appearing or diaphoretic.  HENT:     Head: Normocephalic and atraumatic.     Mouth/Throat:     Mouth: Mucous membranes are moist.  Eyes:     General: No scleral icterus.       Right eye: No discharge.        Left eye: No discharge.     Extraocular Movements: Extraocular movements intact.     Conjunctiva/sclera: Conjunctivae normal.     Pupils: Pupils are equal, round, and reactive to light.  Cardiovascular:     Rate and Rhythm: Normal rate and regular rhythm.  Pulmonary:     Effort: Pulmonary effort is normal. No respiratory distress.     Breath sounds: Rhonchi present. No wheezing or rales.     Comments: cough Musculoskeletal:     Cervical back: Neck supple. No tenderness.  Lymphadenopathy:     Cervical: No cervical adenopathy.  Skin:    General: Skin is warm and dry.     Coloration: Skin is not jaundiced or pale.     Findings: No erythema or rash.  Neurological:     Mental Status: She is alert  and oriented to person, place, and time. Mental status is at baseline.     Motor: No weakness.     Gait: Gait normal.  Psychiatric:  Mood and Affect: Mood normal.        Behavior: Behavior normal.        Thought Content: Thought content normal.        Judgment: Judgment normal.      No results found. No results found. Results for orders placed or performed in visit on 07/20/22 (from the past 24 hour(s))  POCT Influenza A/B     Status: None   Collection Time: 07/20/22  9:00 AM  Result Value Ref Range   Influenza A, POC Negative Negative   Influenza B, POC Negative Negative  POC COVID-19 BinaxNow     Status: None   Collection Time: 07/20/22  9:00 AM  Result Value Ref Range   SARS Coronavirus 2 Ag Negative Negative    Assessment/Plan: CLODAGH ODENTHAL is a 76 y.o. female present for OV for  Acute cough - POCT Influenza A/B> neg - POC COVID-19 BinaxNow> neg Bacterial respiratory infection/cough Rest, hydrate.  mucinex (DM if cough) otc can be helpful Encourage start of OTC antihistamine daily (esp if traveling)- allegra or zyrtec augmentin prescribed, take until completed.  Prednisone taper prescribed If cough present it can last up to 6-8 weeks.  F/U 2 weeks if not improved.   Reviewed expectations re: course of current medical issues. Discussed self-management of symptoms. Outlined signs and symptoms indicating need for more acute intervention. Patient verbalized understanding and all questions were answered. Patient received an After-Visit Summary.    Orders Placed This Encounter  Procedures   POCT Influenza A/B   POC COVID-19 BinaxNow   Meds ordered this encounter  Medications   amoxicillin-clavulanate (AUGMENTIN) 875-125 MG tablet    Sig: Take 1 tablet by mouth 2 (two) times daily.    Dispense:  20 tablet    Refill:  0   predniSONE (DELTASONE) 20 MG tablet    Sig: 60 mg x3d, 40 mg x3d, 20 mg x2d, 10 mg x2d    Dispense:  18 tablet    Refill:  0    Referral Orders  No referral(s) requested today     Note is dictated utilizing voice recognition software. Although note has been proof read prior to signing, occasional typographical errors still can be missed. If any questions arise, please do not hesitate to call for verification.   electronically signed by:  Howard Pouch, DO  [place]

## 2022-07-20 NOTE — Patient Instructions (Addendum)
Return in about 2 weeks (around 08/03/2022), or if symptoms worsen or fail to improve.  Start one of the antihistamines (especially when traveling) Allegra- purple cap- can be taken anytime of the day and not cause sleepiness Zyrtec - green cap- take before bed      Great to see you today.  I have refilled the medication(s) we provide.   If labs were collected, we will inform you of lab results once received either by echart message or telephone call.   - echart message- for normal results that have been seen by the patient already.   - telephone call: abnormal results or if patient has not viewed results in their echart.

## 2022-07-21 DIAGNOSIS — M1712 Unilateral primary osteoarthritis, left knee: Secondary | ICD-10-CM | POA: Diagnosis not present

## 2022-07-21 DIAGNOSIS — M25562 Pain in left knee: Secondary | ICD-10-CM | POA: Diagnosis not present

## 2022-07-28 DIAGNOSIS — M1711 Unilateral primary osteoarthritis, right knee: Secondary | ICD-10-CM | POA: Diagnosis not present

## 2022-08-03 ENCOUNTER — Other Ambulatory Visit (HOSPITAL_BASED_OUTPATIENT_CLINIC_OR_DEPARTMENT_OTHER): Payer: Self-pay

## 2022-08-03 MED ORDER — AREXVY 120 MCG/0.5ML IM SUSR
INTRAMUSCULAR | 0 refills | Status: DC
  Filled 2022-08-03: qty 0.5, 1d supply, fill #0

## 2022-09-10 DIAGNOSIS — M1711 Unilateral primary osteoarthritis, right knee: Secondary | ICD-10-CM | POA: Diagnosis not present

## 2022-09-10 DIAGNOSIS — M25561 Pain in right knee: Secondary | ICD-10-CM | POA: Diagnosis not present

## 2022-11-04 ENCOUNTER — Ambulatory Visit (INDEPENDENT_AMBULATORY_CARE_PROVIDER_SITE_OTHER): Payer: Medicare Other | Admitting: Family Medicine

## 2022-11-04 ENCOUNTER — Encounter: Payer: Self-pay | Admitting: Family Medicine

## 2022-11-04 VITALS — BP 111/72 | HR 64 | Temp 97.8°F | Ht 60.63 in | Wt 129.4 lb

## 2022-11-04 DIAGNOSIS — R7309 Other abnormal glucose: Secondary | ICD-10-CM | POA: Diagnosis not present

## 2022-11-04 DIAGNOSIS — M8580 Other specified disorders of bone density and structure, unspecified site: Secondary | ICD-10-CM

## 2022-11-04 DIAGNOSIS — Z1231 Encounter for screening mammogram for malignant neoplasm of breast: Secondary | ICD-10-CM | POA: Diagnosis not present

## 2022-11-04 DIAGNOSIS — D751 Secondary polycythemia: Secondary | ICD-10-CM | POA: Diagnosis not present

## 2022-11-04 DIAGNOSIS — L989 Disorder of the skin and subcutaneous tissue, unspecified: Secondary | ICD-10-CM | POA: Diagnosis not present

## 2022-11-04 DIAGNOSIS — M8589 Other specified disorders of bone density and structure, multiple sites: Secondary | ICD-10-CM | POA: Diagnosis not present

## 2022-11-04 DIAGNOSIS — E7800 Pure hypercholesterolemia, unspecified: Secondary | ICD-10-CM | POA: Diagnosis not present

## 2022-11-04 LAB — CBC
HCT: 41.8 % (ref 36.0–46.0)
Hemoglobin: 14.1 g/dL (ref 12.0–15.0)
MCHC: 33.8 g/dL (ref 30.0–36.0)
MCV: 101.8 fl — ABNORMAL HIGH (ref 78.0–100.0)
Platelets: 204 10*3/uL (ref 150.0–400.0)
RBC: 4.11 Mil/uL (ref 3.87–5.11)
RDW: 13 % (ref 11.5–15.5)
WBC: 3.9 10*3/uL — ABNORMAL LOW (ref 4.0–10.5)

## 2022-11-04 LAB — COMPREHENSIVE METABOLIC PANEL
ALT: 12 U/L (ref 0–35)
AST: 19 U/L (ref 0–37)
Albumin: 4.3 g/dL (ref 3.5–5.2)
Alkaline Phosphatase: 74 U/L (ref 39–117)
BUN: 20 mg/dL (ref 6–23)
CO2: 30 mEq/L (ref 19–32)
Calcium: 10.1 mg/dL (ref 8.4–10.5)
Chloride: 98 mEq/L (ref 96–112)
Creatinine, Ser: 0.73 mg/dL (ref 0.40–1.20)
GFR: 79.46 mL/min (ref >60.00)
Glucose, Bld: 79 mg/dL (ref 70–99)
Potassium: 4.6 mEq/L (ref 3.5–5.1)
Sodium: 135 mEq/L (ref 135–145)
Total Bilirubin: 0.5 mg/dL (ref 0.2–1.2)
Total Protein: 7 g/dL (ref 6.0–8.3)

## 2022-11-04 LAB — TSH: TSH: 2.46 u[IU]/mL (ref 0.35–5.50)

## 2022-11-04 LAB — LIPID PANEL
Cholesterol: 216 mg/dL — ABNORMAL HIGH (ref 0–200)
HDL: 91.1 mg/dL (ref >39.00)
LDL Cholesterol: 112 mg/dL — ABNORMAL HIGH (ref 0–99)
NonHDL: 125.26
Total CHOL/HDL Ratio: 2
Triglycerides: 64 mg/dL (ref 0.0–149.0)
VLDL: 12.8 mg/dL (ref 0.0–40.0)

## 2022-11-04 LAB — HEMOGLOBIN A1C: Hgb A1c MFr Bld: 5.3 % (ref 4.6–6.5)

## 2022-11-04 LAB — VITAMIN D 25 HYDROXY (VIT D DEFICIENCY, FRACTURES): VITD: 22.57 ng/mL — ABNORMAL LOW (ref 30.00–100.00)

## 2022-11-04 NOTE — Patient Instructions (Addendum)
Return in about 1 year (around 11/06/2023) for Routine chronic condition follow-up.        Great to see you today.  I have refilled the medication(s) we provide.   If labs were collected, we will inform you of lab results once received either by echart message or telephone call.   - echart message- for normal results that have been seen by the patient already.   - telephone call: abnormal results or if patient has not viewed results in their echart.

## 2022-11-04 NOTE — Progress Notes (Signed)
Patient ID: Sara Doyle, female  DOB: [birth year], 77 y.o.   MRN: [id number] Patient Care Team    Relationship Specialty Notifications Start End  Ma Hillock, DO PCP - General Family Medicine  05/10/15   Milus Banister, MD Attending Physician Gastroenterology  10/05/16   Rutherford Guys, MD Consulting Physician Ophthalmology  10/05/16   Ellis Parents, DDS Referring Physician Dentistry  08/12/21   Buford Dresser, MD Consulting Physician Cardiology  04/29/22   Warden Fillers, MD Consulting Physician Ophthalmology  04/29/22   Berenice Primas, MD Referring Physician Orthopedic Surgery  04/29/22     Chief Complaint  Patient presents with   Hyperlipidemia    Pt is fasting; [facility]    Subjective: Sara Doyle is a 77 y.o.  Female  present for Chronic Conditions/illness Management  All past medical history, surgical history, allergies, family history, immunizations, medications and social history were updated in the electronic medical record today. All recent labs, ED visits and hospitalizations within the last year were reviewed.  Osteopenia, unspecified location pt taking vit d supplement. T-score of -1.6 (04/2022) This patient is considered osteopenic. Due 2026  Elevated cholesterol/fhx heart disease in bother.  Has been diet controlled the past.  However her last LDL did increase to put her at a mild elevation in cardiac risk over normal population.  She attempted to start low-dose Crestor 5 mg every other day and was unable to tolerate secondary to myalgias.      11/04/2022    9:18 AM 11/05/2021    1:50 PM 11/03/2021    1:03 PM 08/12/2021    8:13 AM 11/01/2020    1:06 PM  Depression screen PHQ 2/9  Decreased Interest 0 0 0 0 0  Down, Depressed, Hopeless 0 0 0 0 0  PHQ - 2 Score 0 0 0 0 0      11/04/2022    9:19 AM  GAD 7 : Generalized Anxiety Score  Nervous, Anxious, on Edge 0  Control/stop worrying 0  Worry too much - different things 0  Trouble relaxing 0   Restless 0  Easily annoyed or irritable 0  Afraid - awful might happen 0  Total GAD 7 Score 0     Immunization History  Administered Date(s) Administered   Influenza, High Dose Seasonal PF 07/04/2018, 06/17/2019, 06/01/2022   Influenza-Unspecified 06/23/2015, 06/21/2016, 06/07/2017, 07/24/2020, 06/07/2021   PFIZER(Purple Top)SARS-COV-2 Vaccination 09/27/2019, 10/16/2019, 07/02/2020, 02/07/2021   Pfizer Covid-19 Vaccine Bivalent Booster 94yr & up 06/10/2021   Pneumococcal Conjugate-13 02/12/2014   Pneumococcal Polysaccharide-23 01/06/2011   Respiratory Syncytial Virus Vaccine,Recomb Aduvanted(Arexvy) 08/03/2022   Tdap 01/06/2003, 03/07/2021   Unspecified SARS-COV-2 Vaccination 06/01/2022   Zoster Recombinat (Shingrix) 01/01/2020, 04/12/2020    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   Dr GBerenice Primas9/2019   Muscle strain of thigh, right, initial encounter 01/27/2017   Vaginal dryness, menopausal 10/04/2015   Allergies  Allergen Reactions   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 and Swelling    Tongue swelling/difficulty breathing   Demerol [Meperidine] Nausea And Vomiting   Past Surgical History:  Procedure Laterality Date   BUNIONECTOMY Right    FRACTURE SURGERY  1969   right leg   FRACTURE SURGERY  2005   right leg   SKIN LESION EXCISION Right 07/08/2017   Behind right knee, benign   TIBIA FRACTURE SURGERY Right    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, '05   TOTAL HIP  ARTHROPLASTY Right 05/20/2018   Procedure: RIGHT TOTAL HIP ARTHROPLASTY ANTERIOR APPROACH;  Surgeon: [name], MD;  Location: [facility];  Service: Orthopedics;  Laterality: Right;   TUBAL LIGATION     Family History  Problem Relation Age of Onset   Hypertension Mother    Arthritis Father        osteo   Heart disease Brother        congenital   Breast cancer Sister 62       breast   Arthritis Sister    Hypertension Sister    Heart disease Brother     Cancer Brother        prostate, colon   Arrhythmia Brother        a-fib   Heart disease Brother    Colon cancer Neg Hx    Esophageal cancer Neg Hx    Rectal cancer Neg Hx    Stomach cancer Neg Hx    Social History   Social History Narrative   Sara Doyle lives with her husband, She relocated from [place] Nov., 2015. She is retired, has 3 grown sons that live in [place].   Smoke alarm in the home, wears her seatbelt, uses sunscreen.   Ambulates independently. No dentures.   Has dentist with regular appts.    Feels safe in her relationships    Allergies as of 11/04/2022       Reactions   Oysters [shellfish Allergy] Anaphylaxis, Swelling   Tongue swelling/difficulty breathing   Demerol [meperidine] Nausea And Vomiting        Medication List        Accurate as of November 04, 2022  9:37 AM. If you have any questions, ask your nurse or doctor.          STOP taking these medications    amoxicillin-clavulanate 875-125 MG tablet Commonly known as: AUGMENTIN Stopped by: Howard Pouch, DO   Arexvy 120 MCG/0.5ML injection Generic drug: RSV vaccine recomb adjuvanted Stopped by: Howard Pouch, DO   predniSONE 20 MG tablet Commonly known as: DELTASONE Stopped by: Howard Pouch, DO       TAKE these medications    b complex vitamins tablet Take 1 tablet by mouth daily.   CALCIUM 500/D PO Take by mouth.   OVER THE COUNTER MEDICATION Areds preservision one tablet daily.        All past medical history, surgical history, allergies, family history, immunizations andmedications were updated in the EMR today and reviewed under the history and medication portions of their EMR.      ROS: 14 pt review of systems performed and negative (unless mentioned in an HPI)  Objective: BP 111/72   Pulse 64   Temp 97.8 F (36.6 C)   Ht 5' 0.63" (1.54 m)   Wt 129 lb 6.4 oz (58.7 kg)   SpO2 97%   BMI 24.75 kg/m  Physical Exam Vitals and nursing note reviewed.   Constitutional:      General: She is not in acute distress.    Appearance: Normal appearance. She is not ill-appearing, toxic-appearing or diaphoretic.  HENT:     Head: Normocephalic and atraumatic.  Eyes:     General: No scleral icterus.       Right eye: No discharge.        Left eye: No discharge.     Extraocular Movements: Extraocular movements intact.     Conjunctiva/sclera: Conjunctivae normal.     Pupils: Pupils are equal, round, and reactive to  light.  Cardiovascular:     Rate and Rhythm: Normal rate and regular rhythm.     Heart sounds: No murmur heard.    No gallop.  Pulmonary:     Effort: Pulmonary effort is normal. No respiratory distress.     Breath sounds: Normal breath sounds. No wheezing, rhonchi or rales.  Musculoskeletal:     Cervical back: Neck supple. No tenderness.  Lymphadenopathy:     Cervical: No cervical adenopathy.  Skin:    General: Skin is warm and dry.     Coloration: Skin is not jaundiced or pale.     Findings: Lesion (left side of nose ~17m flat shiny lesion with central ulceration) present. No erythema or rash.  Neurological:     Mental Status: She is alert and oriented to person, place, and time. Mental status is at baseline.     Motor: No weakness.     Gait: Gait normal.  Psychiatric:        Mood and Affect: Mood normal.        Behavior: Behavior normal.        Thought Content: Thought content normal.        Judgment: Judgment normal.    No results found.  Assessment/plan: LREVAN STROJNYis a 77y.o. female present for Chronic Conditions/illness Management Osteopenia, unspecified location Bone density (-1.6) rpt due 2026 Continue vit d supplement.  Exercise routinely.former smoker.  Vit d collected today  Elevated glucose: A1c collected today  Hyperlipidemia: Unable to tolerate even low-dose every other day Crestor. dietary modifications discussed in detail. Lipids, cbc, cmp collected today  Breast cancer screening/Health  maintenance: Would like to have every other year, due this year. Ordered for her  Skin lesion:  Referral to Derm for poss. BCC left nasal bridge  Return in about 1 year (around 11/06/2023) for Routine chronic condition follow-up.  Orders Placed This Encounter  Procedures   MM 3D SCREEN BREAST BILATERAL   Comprehensive metabolic panel   Hemoglobin A1c   Lipid panel   TSH   CBC   Vitamin D (25 hydroxy)   Ambulatory referral to Dermatology   No orders of the defined types were placed in this encounter.  Referral Orders         Ambulatory referral to Dermatology       Electronically signed by: RHoward Pouch DO [place]

## 2022-11-05 ENCOUNTER — Encounter: Payer: Self-pay | Admitting: Family Medicine

## 2022-11-05 DIAGNOSIS — E559 Vitamin D deficiency, unspecified: Secondary | ICD-10-CM | POA: Insufficient documentation

## 2022-11-09 ENCOUNTER — Ambulatory Visit (INDEPENDENT_AMBULATORY_CARE_PROVIDER_SITE_OTHER): Payer: Medicare Other

## 2022-11-09 DIAGNOSIS — Z0000 Encounter for general adult medical examination without abnormal findings: Secondary | ICD-10-CM

## 2022-11-09 NOTE — Patient Instructions (Signed)

## 2022-11-09 NOTE — Progress Notes (Signed)
Subjective:   Sara Doyle is a 77 y.o. female who presents for Medicare Annual (Subsequent) preventive examination.  I connected with  EMILEA KONOP on 11/09/22 by an audio only telemedicine application and verified that I am speaking with the correct person using two identifiers.   I discussed the limitations, risks, security and privacy concerns of performing an evaluation and management service by telephone and the availability of in person appointments. I also discussed with the patient that there may be a patient responsible charge related to this service. The patient expressed understanding and verbally consented to this telephonic visit.  Location of Patient: home Location of Provider: office  List any persons and their role that are participating in the visit with the patient.   [place], CMA  Review of Systems    Defer to PCP Cardiac Risk Factors include: advanced age (>56mn, >>87women)     Objective:    There were no vitals filed for this visit. There is no height or weight on file to calculate BMI.     11/09/2022    1:07 PM 11/05/2021    1:52 PM 10/30/2020    3:06 PM 10/20/2019    8:49 AM 10/14/2018    1:33 PM 05/20/2018    1:00 PM 05/12/2018    9:02 AM  Advanced Directives  Does Patient Have a Medical Advance Directive? Yes Yes Yes Yes Yes No No  Type of AParamedicof A[place]andy[place]Living will Healthcare Power of [place]landsLiving will [place] will;Out of facility DNR (pink MOST or yellow form) H[place]ache CreekLiving will    Does patient want to make changes to medical advance directive? No - Patient declined   Yes (ED - Information included in AVS)     Copy of HM[place]ryin Chart?  Yes - validated most recent copy scanned in chart (See row information)  No - copy requested Yes - validated most recent copy scanned in chart (See row information)    Would patient like  information on creating a medical advance directive?      No - Patient declined No - Patient declined    Current Medications (verified) Outpatient Encounter Medications as of 11/09/2022  Medication Sig   b complex vitamins tablet Take 1 tablet by mouth daily.   Calcium Carb-Cholecalciferol (CALCIUM 500/D PO) Take by mouth.   OVER THE COUNTER MEDICATION Areds preservision one tablet daily.   No facility-administered encounter medications on file as of 11/09/2022.    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   Dr GBerenice Primas9/2019   Hyperlipidemia    Muscle strain of thigh, right, initial encounter 01/27/2017   Vaginal dryness, menopausal 10/04/2015   Past Surgical History:  Procedure Laterality Date   BUNIONECTOMY Right    FRACTURE SURGERY  1969   right leg   FRACTURE SURGERY  2005   right leg   JOINT REPLACEMENT  2019 and L hip 2022   R hip   SKIN LESION EXCISION Right 07/08/2017   Behind right knee, benign   TIBIA FRACTURE SURGERY Right    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, '05   TOTAL HIP ARTHROPLASTY Right 05/20/2018   Procedure: RIGHT TOTAL HIP ARTHROPLASTY ANTERIOR APPROACH;  Surgeon: GDorna Leitz MD;  Location: [facility];  Service: Orthopedics;  Laterality: Right;   TUBAL LIGATION     Family History  Problem Relation Age of Onset   Hypertension Mother    Miscarriages / [place] Mother    Arthritis Father        osteo   Miscarriages / Stillbirths Sister    Heart disease Brother        congenital   Breast cancer Sister 24       breast   Cancer Sister    Arthritis Sister    Hypertension Sister    Heart disease Brother    Cancer Brother    Varicose Veins Brother    Cancer Brother        prostate, colon   Arrhythmia Brother        a-fib   Heart disease Brother    Asthma Son    Colon cancer Neg Hx    Esophageal  cancer Neg Hx    Rectal cancer Neg Hx    Stomach cancer Neg Hx    Social History   Socioeconomic History   Marital status: Married    Spouse name: Not on file   Number of children: 3   Years of education: Not on file   Highest education level: Bachelor's degree (e.g., BA, AB, BS)  Occupational History    Comment: retired  Tobacco Use   Smoking status: Former    Packs/day: 1.00    Years: 25.00    Total pack years: 25.00    Types: Cigarettes    Quit date: 09/07/1993    Years since quitting: 29.1   Smokeless tobacco: Never  Vaping Use   Vaping Use: Never used  Substance and Sexual Activity   Alcohol use: Yes    Alcohol/week: 4.0 standard drinks of alcohol    Types: 4 Glasses of wine per week    Comment: 4-5 glasses/week   Drug use: No   Sexual activity: Yes    Birth control/protection: Post-menopausal  Other Topics Concern   Not on file  Social History Narrative   Ms. Gleba lives with her husband, She relocated from [place] Nov., 2015. She is retired, has 3 grown sons that live in [place].   Smoke alarm in the home, wears her seatbelt, uses sunscreen.   Ambulates independently. No dentures.   Has dentist with regular appts.    Feels safe in her relationships   Social Determinants of Health   Financial Resource Strain: Low Risk  (11/09/2022)   Overall Financial Resource Strain (CARDIA)    Difficulty of Paying Living Expenses: Not hard at all  Food Insecurity: No Food Insecurity (11/09/2022)   Hunger Vital Sign    Worried About Running Out of Food in the Last Year: Never true    Ran Out of Food in the Last Year: Never true  Transportation Needs: No Transportation Needs (11/09/2022)   PRAPARE - Hydrologist (Medical): No    Lack of Transportation (Non-Medical): No  Physical Activity: Sufficiently Active (11/09/2022)   Exercise Vital Sign    Days of Exercise per Week: 5 days    Minutes of Exercise per Session: 40 min  Stress: No Stress Concern  Present (11/09/2022)   [place]    Feeling of Stress : Not at all  Social Connections: Moderately Integrated (11/09/2022)   Social Connection and Isolation Panel [NHANES]    Frequency of Communication with Friends and Family: More than three times a week  Frequency of Social Gatherings with Friends and Family: Once a week    Attends Religious Services: 1 to 4 times per year    Active Member of [employer] or Organizations: No    Attends Music therapist: Not on file    Marital Status: Married    Tobacco Counseling Counseling given: Not Answered   Clinical Intake:  Pre-visit preparation completed: No        Diabetes: No  How often do you need to have someone help you when you read instructions, pamphlets, or other written materials from your doctor or pharmacy?: 1 - Never  Diabetic?no  Interpreter Needed?: No      Activities of Daily Living    11/09/2022    1:08 PM 11/04/2022    9:14 AM  In your present state of health, do you have any difficulty performing the following activities:  Hearing? 0 0  Vision? 0 0  Difficulty concentrating or making decisions? 0 0  Walking or climbing stairs? 0 0  Dressing or bathing? 0 0  Doing errands, shopping? 0 0  Preparing Food and eating ? N   Using the Toilet? N   In the past six months, have you accidently leaked urine? N   Do you have problems with loss of bowel control? N   Managing your Medications? N   Managing your Finances? N   Housekeeping or managing your Housekeeping? N     Patient Care Team: Ma Hillock, DO as PCP - General (Family Medicine) Milus Banister, MD as Attending Physician (Gastroenterology) Rutherford Guys, MD as Consulting Physician (Ophthalmology) Ellis Parents, DDS as Referring Physician (Dentistry) Buford Dresser, MD as Consulting Physician (Cardiology) Warden Fillers, MD as Consulting Physician  (Ophthalmology) Berenice Primas, MD as Referring Physician (Orthopedic Surgery)  Indicate any recent Medical Services you may have received from other than [facility] providers in the past year (date may be approximate).     Assessment:   This is a routine wellness examination for [place].  Hearing/Vision screen No results found.  Dietary issues and exercise activities discussed: Exercise limited by: None identified   Goals Addressed   None   Depression Screen    11/09/2022    1:06 PM 11/04/2022    9:18 AM 11/05/2021    1:50 PM 11/03/2021    1:03 PM 08/12/2021    8:13 AM 11/01/2020    1:06 PM 10/30/2020    3:09 PM  PHQ 2/9 Scores  PHQ - 2 Score 0 0 0 0 0 0 0    Fall Risk    11/09/2022    1:08 PM 11/04/2022    9:12 AM 09/15/2022    2:31 PM 11/05/2021    1:52 PM 08/08/2021   10:40 PM  Fall Risk   Falls in the past year? '1 1 1 1 1  '$ Number falls in past yr: '1 1 1 1 '$ 0  Comment    hiking   Injury with Fall? '1 1 1 1 '$ 0  Comment    no injury   Risk for fall due to : History of fall(s) History of fall(s)  Impaired vision   Follow up Falls evaluation completed Falls evaluation completed  Falls prevention discussed     FALL RISK PREVENTION PERTAINING TO THE HOME:  Any stairs in or around the home? Yes  If so, are there any without handrails? Yes  Home free of loose throw rugs in walkways, pet beds, electrical cords, etc? Yes  Adequate lighting in  your home to reduce risk of falls? Yes   ASSISTIVE DEVICES UTILIZED TO PREVENT FALLS:  Life alert? No  Use of a cane, walker or w/c? No  Grab bars in the bathroom? Yes  Shower chair or bench in shower? Yes  Elevated toilet seat or a handicapped toilet? Yes   TIMED UP AND GO:  Was the test performed? No .  Length of time to ambulate 10 feet: n/a sec.     Cognitive Function:    10/14/2018    1:37 PM 10/11/2017    2:24 PM  MMSE - Mini Mental State Exam  Orientation to time 5 5  Orientation to Place 5 5  Registration 3 3  Attention/  Calculation 5 5  Recall 2 3  Language- name 2 objects 2 2  Language- repeat 1 1  Language- follow 3 step command 3 3  Language- read & follow direction 1 1  Write a sentence 1 1  Copy design 1 1  Total score 29 30        11/09/2022    1:09 PM 11/05/2021    1:55 PM  6CIT Screen  What Year? 0 points 0 points  What month? 0 points 0 points  What time? 0 points 0 points  Count back from 20 0 points 0 points  Months in reverse 0 points 0 points  Repeat phrase 0 points 0 points  Total Score 0 points 0 points    Immunizations Immunization History  Administered Date(s) Administered   Influenza, High Dose Seasonal PF 07/04/2018, 06/17/2019, 06/01/2022   Influenza-Unspecified 06/23/2015, 06/21/2016, 06/07/2017, 07/24/2020, 06/07/2021   PFIZER(Purple Top)SARS-COV-2 Vaccination 09/27/2019, 10/16/2019, 07/02/2020, 02/07/2021   Pfizer Covid-19 Vaccine Bivalent Booster 41yr & up 06/10/2021   Pneumococcal Conjugate-13 02/12/2014   Pneumococcal Polysaccharide-23 01/06/2011   Respiratory Syncytial Virus Vaccine,Recomb Aduvanted(Arexvy) 08/03/2022   Tdap 01/06/2003, 03/07/2021   Unspecified SARS-COV-2 Vaccination 06/01/2022   Zoster Recombinat (Shingrix) 01/01/2020, 04/12/2020    TDAP status: Up to date  Flu Vaccine status: Up to date  Pneumococcal vaccine status: Up to date  Covid-19 vaccine status: Completed vaccines  Qualifies for Shingles Vaccine? Yes   Zostavax completed No   Shingrix Completed?: Yes  Screening Tests Health Maintenance  Topic Date Due   MAMMOGRAM  04/17/2023   Medicare Annual Wellness (AWV)  11/09/2023   DEXA SCAN  04/17/2025   DTaP/Tdap/Td (3 - Td or Tdap) 03/08/2031   Pneumonia Vaccine 78 Years old  Completed   INFLUENZA VACCINE  Completed   Hepatitis C Screening  Completed   Zoster Vaccines- Shingrix  Completed   HPV VACCINES  Aged Out   COLONOSCOPY (Pts [phone] coverage will need to be confirmed)  Discontinued   COVID-19 Vaccine   Discontinued    Health Maintenance  There are no preventive care reminders to display for this patient.   Colorectal cancer screening: No longer required.   Mammogram status: No longer required due to age.  Bone Density status: Completed 08/11/223. Results reflect: Bone density results: OSTEOPENIA. Repeat every 2 years.  Lung Cancer Screening: (Low Dose CT Chest recommended if Age 78-80ears, 30 pack-year currently smoking OR have quit w/in 15years.) does not qualify.   Lung Cancer Screening Referral: n/a  Additional Screening:  Hepatitis C Screening: does qualify; Completed 10/12/2016  Vision Screening: Recommended annual ophthalmology exams for early detection of glaucoma and other disorders of the eye. Is the patient up to date with their annual eye exam?  Yes  Who is the provider  or what is the name of the office in which the patient attends annual eye exams? [facility] If pt is not established with a provider, would they like to be referred to a provider to establish care? No .   Dental Screening: Recommended annual dental exams for proper oral hygiene  Community Resource Referral / Chronic Care Management: CRR required this visit?  No   CCM required this visit?  No      Plan:     I have personally reviewed and noted the following in the patient's chart:   Medical and social history Use of alcohol, tobacco or illicit drugs  Current medications and supplements including opioid prescriptions. Patient is not currently taking opioid prescriptions. Functional ability and status Nutritional status Physical activity Advanced directives List of other physicians Hospitalizations, surgeries, and ER visits in previous 12 months Vitals Screenings to include cognitive, depression, and falls Referrals and appointments  In addition, I have reviewed and discussed with patient certain preventive protocols, quality metrics, and best practice recommendations. A written  personalized care plan for preventive services as well as general preventive health recommendations were provided to patient.     Beatrix Fetters, [place]   11/09/2022   Nurse Notes: Non-Face to Face or Face to Face 3 minute visit Encounter    Ms. Linus Mako , Thank you for taking time to come for your Medicare Wellness Visit. I appreciate your ongoing commitment to your health goals. Please review the following plan we discussed and let me know if I can assist you in the future.   These are the goals we discussed:  Goals      Patient Stated     Stay active (to travel).      Patient Stated     Have knee replacement surgery & cataract surgery     Patient Stated     None at this time         This is a list of the screening recommended for you and due dates:  Health Maintenance  Topic Date Due   Mammogram  04/17/2023   Medicare Annual Wellness Visit  11/09/2023   DEXA scan (bone density measurement)  04/17/2025   DTaP/Tdap/Td vaccine (3 - Td or Tdap) 03/08/2031   Pneumonia Vaccine  Completed   Flu Shot  Completed   Hepatitis C Screening: USPSTF Recommendation to screen - Ages 23-79 yo.  Completed   Zoster (Shingles) Vaccine  Completed   HPV Vaccine  Aged Out   Colon Cancer Screening  Discontinued   COVID-19 Vaccine  Discontinued

## 2022-11-18 ENCOUNTER — Ambulatory Visit
Admission: RE | Admit: 2022-11-18 | Discharge: 2022-11-18 | Disposition: A | Discharge status: Home / Self Care | Payer: Medicare Other | Source: Ambulatory Visit | Attending: Family Medicine | Admitting: Family Medicine

## 2022-11-18 DIAGNOSIS — Z1231 Encounter for screening mammogram for malignant neoplasm of breast: Secondary | ICD-10-CM | POA: Diagnosis not present

## 2022-11-26 ENCOUNTER — Ambulatory Visit (INDEPENDENT_AMBULATORY_CARE_PROVIDER_SITE_OTHER): Payer: Medicare Other | Admitting: Dermatology

## 2022-11-26 ENCOUNTER — Encounter: Payer: Self-pay | Admitting: Dermatology

## 2022-11-26 DIAGNOSIS — D489 Neoplasm of uncertain behavior, unspecified: Secondary | ICD-10-CM | POA: Diagnosis not present

## 2022-11-26 DIAGNOSIS — C44311 Basal cell carcinoma of skin of nose: Secondary | ICD-10-CM

## 2022-11-26 DIAGNOSIS — L578 Other skin changes due to chronic exposure to nonionizing radiation: Secondary | ICD-10-CM

## 2022-11-26 DIAGNOSIS — X32XXXA Exposure to sunlight, initial encounter: Secondary | ICD-10-CM

## 2022-11-26 DIAGNOSIS — B079 Viral wart, unspecified: Secondary | ICD-10-CM | POA: Diagnosis not present

## 2022-11-26 DIAGNOSIS — C4491 Basal cell carcinoma of skin, unspecified: Secondary | ICD-10-CM

## 2022-11-26 HISTORY — DX: Basal cell carcinoma of skin, unspecified: C44.91

## 2022-11-26 NOTE — Progress Notes (Signed)
   New Patient Visit  Subjective  Sara Doyle is a 77 y.o. female who presents for the following: Spot check (Patient here for a spot check on the left side of nose. PCP referred. Does not itch or bother her. Sometimes when washing her face it will sting a little bit. No hx of skin cancer. No family hx of skin cancer. ).    Objective  Well appearing patient in no apparent distress; mood and affect are within normal limits.  A focused examination was performed including face. Relevant physical exam findings are noted in the Assessment and Plan.  Left Nasal Sidewall Pearly Papule  Right Lower Leg - Posterior Verrucous papule   Assessment & Plan  Neoplasm of uncertain behavior Left Nasal Sidewall  Skin / nail biopsy Type of biopsy: tangential   Informed consent: discussed and consent obtained   Timeout: patient name, date of birth, surgical site, and procedure verified   Procedure prep:  Patient was prepped and draped in usual sterile fashion Prep type:  Isopropyl alcohol Anesthesia: the lesion was anesthetized in a standard fashion   Anesthetic:  1% lidocaine w/ epinephrine 1-100,000 buffered w/ 8.4% NaHCO3 Instrument used: DermaBlade   Hemostasis achieved with: aluminum chloride   Outcome: patient tolerated procedure with difficulty   Post-procedure details: sterile dressing applied and wound care instructions given   Dressing type: petrolatum and bandage    Specimen 1 - Surgical pathology Differential Diagnosis: BCC  Check Margins: No  I counseled the patient regarding the following: Instructions: Neoplasms of Uncertain Behavior can be observed, biopsied or surgically removed depending on the level of clinical suspicion.   Viral warts, unspecified type Right Lower Leg - Posterior  Diagnosed onset of Warts, counseled pt on contagious nature of the lesions and to prevent spread by avoiding skin to skin contact with others, avoiding baths and using new  towels/pajamas daily. The time spent counseling this attention is separate from any procedure performed today   Destruction of lesion - Right Lower Leg - Posterior Complexity: simple   Destruction method: cryotherapy   Informed consent: discussed and consent obtained   Timeout:  patient name, date of birth, surgical site, and procedure verified Lesion destroyed using liquid nitrogen: Yes   Region frozen until ice ball extended beyond lesion: Yes   Outcome: patient tolerated procedure well with no complications   Post-procedure details: wound care instructions given     Return in about 1 year (around 11/26/2023) for anual skin check .  I, Zigmund Gottron, CMA, am acting as scribe for Ellard Artis, MD.

## 2022-11-26 NOTE — Patient Instructions (Signed)
Cryotherapy Aftercare  Wash gently with soap and water everyday.   Apply Vaseline and Band-Aid daily until healed.   Patient Handout: Wound Care for Skin Biopsy Site  Patient Handout: Wound Care for Skin Biopsy Site  Taking Care of Your Skin Biopsy Site  Proper care of the biopsy site is essential for promoting healing and minimizing scarring. This handout provides instructions on how to care for your biopsy site to ensure optimal recovery.  1. Cleaning the Wound:  Clean the biopsy site daily with gentle soap and water. Gently pat the area dry with a clean, soft towel. Avoid harsh scrubbing or rubbing the area, as this can irritate the skin and delay healing.  2. Applying Aquaphor and Bandage:  After cleaning the wound, apply a thin layer of Aquaphor ointment to the biopsy site. Cover the area with a sterile bandage to protect it from dirt, bacteria, and friction. Change the bandage daily or as needed if it becomes soiled or wet.  3. Continued Care for One Week:  Repeat the cleaning, Aquaphor application, and bandaging process daily for one week following the biopsy procedure. Keeping the wound clean and moist during this initial healing period will help prevent infection and promote optimal healing.  4. Massaging Aquaphor into the Area:  ---After one week, discontinue the use of bandages but continue to apply Aquaphor to the biopsy site. ----Gently massage the Aquaphor into the area using circular motions. ---Massaging the skin helps to promote circulation and prevent the formation of scar tissue.   Additional Tips:  Avoid exposing the biopsy site to direct sunlight during the healing process, as this can cause hyperpigmentation or worsen scarring. If you experience any signs of infection, such as increased redness, swelling, warmth, or drainage from the wound, contact your healthcare provider immediately. Follow any additional instructions provided by your healthcare  provider for caring for the biopsy site and managing any discomfort. Conclusion:  Taking proper care of your skin biopsy site is crucial for ensuring optimal healing and minimizing scarring. By following these instructions for cleaning, applying Aquaphor, and massaging the area, you can promote a smooth and successful recovery. If you have any questions or concerns about caring for your biopsy site, don't hesitate to contact your healthcare provider for guidance.   Due to recent changes in healthcare laws, you may see results of your pathology and/or laboratory studies on MyChart before the doctors have had a chance to review them. We understand that in some cases there may be results that are confusing or concerning to you. Please understand that not all results are received at the same time and often the doctors may need to interpret multiple results in order to provide you with the best plan of care or course of treatment. Therefore, we ask that you please give [place] 2 business days to thoroughly review all your results before contacting the office for clarification. Should we see a critical lab result, you will be contacted sooner.   If You Need Anything After Your Visit  If you have any questions or concerns for your doctor, please call our main line at [phone] If no one answers, please leave a voicemail as directed and we will return your call as soon as possible. Messages left after 4 pm will be answered the following business day.   You may also send [place] a message via [place]. We typically respond to MyChart messages within 1-2 business days.  For prescription refills, please ask your pharmacy to contact  our office. Our fax number is [phone].  If you have an urgent issue when the clinic is closed that cannot wait until the next business day, you can page your doctor at the number below.    Please note that while we do our best to be available for urgent issues outside of office hours,  we are not available 24/7.   If you have an urgent issue and are unable to reach [place], you may choose to seek medical care at your doctor's office, retail clinic, urgent care center, or emergency room.  If you have a medical emergency, please immediately call 911 or go to the emergency department. In the event of inclement weather, please call our main line at [phone] for an update on the status of any delays or closures.  Dermatology Medication Tips: Please keep the boxes that topical medications come in in order to help keep track of the instructions about where and how to use these. Pharmacies typically print the medication instructions only on the boxes and not directly on the medication tubes.   If your medication is too expensive, please contact our office at [phone] or send [place] a message through [place].   We are unable to tell what your co-pay for medications will be in advance as this is different depending on your insurance coverage. However, we may be able to find a substitute medication at lower cost or fill out paperwork to get insurance to cover a needed medication.   If a prior authorization is required to get your medication covered by your insurance company, please allow [place] 1-2 business days to complete this process.  Drug prices often vary depending on where the prescription is filled and some pharmacies may offer cheaper prices.  The website www.goodrx.com contains coupons for medications through different pharmacies. The prices here do not account for what the cost may be with help from insurance (it may be cheaper with your insurance), but the website can give you the price if you did not use any insurance.  - You can print the associated coupon and take it with your prescription to the pharmacy.  - You may also stop by our office during regular business hours and pick up a GoodRx coupon card.  - If you need your prescription sent electronically to a different  pharmacy, notify our office through [facility] or by phone at [phone]

## 2022-11-27 LAB — SURGICAL PATHOLOGY

## 2022-11-29 ENCOUNTER — Encounter: Payer: Self-pay | Admitting: Dermatology

## 2022-11-30 ENCOUNTER — Telehealth: Payer: Self-pay

## 2022-11-30 NOTE — Telephone Encounter (Signed)
Left message for patient to call for results/hd 

## 2022-11-30 NOTE — Telephone Encounter (Signed)
-----   Message from Talbot Grumbling, MD sent at 11/30/2022  2:54 PM EDT ----- Please call patient with the following biopsy results  Diagnosis Skin , left nasal sidewall BASAL CELL CARCINOMA, INFILTRATIVE PATTERN --> . This is a skin cancer that need to be treated with Mohs Surgery. Refer to Mohs, Dr. Marthenia Rolling    [facility] [address] [place], [place] [address]  Phone [phone]  Fax [phone]

## 2022-12-01 ENCOUNTER — Telehealth: Payer: Self-pay

## 2022-12-01 NOTE — Telephone Encounter (Signed)
-----   Message from Talbot Grumbling, MD sent at 11/30/2022  2:54 PM EDT ----- Please call patient with the following biopsy results  Diagnosis Skin , left nasal sidewall BASAL CELL CARCINOMA, INFILTRATIVE PATTERN --> . This is a skin cancer that need to be treated with Mohs Surgery. Refer to Mohs, Dr. Marthenia Rolling    [facility] [address] [place], [place] [address]  Phone [phone]  Fax [phone]

## 2022-12-03 ENCOUNTER — Telehealth: Payer: Self-pay

## 2022-12-03 DIAGNOSIS — C44311 Basal cell carcinoma of skin of nose: Secondary | ICD-10-CM

## 2022-12-03 NOTE — Telephone Encounter (Signed)
-----   Message from Talbot Grumbling, MD sent at 11/30/2022  2:54 PM EDT ----- Please call patient with the following biopsy results  Diagnosis Skin , left nasal sidewall BASAL CELL CARCINOMA, INFILTRATIVE PATTERN --> . This is a skin cancer that need to be treated with Mohs Surgery. Refer to Mohs, Dr. Marthenia Rolling    [facility] [address] [place], [place] [address]  Phone [phone]  Fax [phone]

## 2022-12-03 NOTE — Telephone Encounter (Signed)
Advised patient of results and have sent a referral to Dr Marthenia Rolling for Mohs surgery/hd

## 2022-12-30 DIAGNOSIS — C44311 Basal cell carcinoma of skin of nose: Secondary | ICD-10-CM | POA: Diagnosis not present

## 2023-01-26 DIAGNOSIS — C44311 Basal cell carcinoma of skin of nose: Secondary | ICD-10-CM | POA: Diagnosis not present

## 2023-02-02 ENCOUNTER — Encounter: Payer: Self-pay | Admitting: Family Medicine

## 2023-02-02 ENCOUNTER — Ambulatory Visit (INDEPENDENT_AMBULATORY_CARE_PROVIDER_SITE_OTHER): Payer: Medicare Other | Admitting: Family Medicine

## 2023-02-02 VITALS — BP 102/64 | HR 77 | Temp 99.6°F | Ht 60.0 in | Wt 129.2 lb

## 2023-02-02 DIAGNOSIS — J069 Acute upper respiratory infection, unspecified: Secondary | ICD-10-CM | POA: Diagnosis not present

## 2023-02-02 MED ORDER — BENZONATATE 100 MG PO CAPS: 100.0000 mg | ORAL_CAPSULE | Freq: Three times a day (TID) | ORAL | 0 refills | Status: DC | PRN

## 2023-02-02 MED ORDER — AMOXICILLIN-POT CLAVULANATE 875-125 MG PO TABS: 1.0000 | ORAL_TABLET | Freq: Two times a day (BID) | ORAL | 0 refills | Status: DC

## 2023-02-02 NOTE — Progress Notes (Signed)
Acute Office Visit   Subjective:  Patient ID: Sara Doyle, female    DOB: 03/23/1946, 77 y.o.   MRN: [id number]  Chief Complaint  Patient presents with   Cough    Pt is here with C/O of coughing Pt reports nausea Pt reports she is coughing and farting . She don't understand this Pt reports she has leaking urine with cough. OTC -Mucinex but stopped and now taking Dayquil and Nyquil Pt reports sx started 1 week ago.   HPI:  Patient is a 77 year old caucasian female that presents with sore throat, non productive cough, chest and sinus congestion, nausea, and gas. SHOB only when coughing.   Denies fever, ear pain or drainage, and chest pain.   Symptoms started 8 days ago.   Patient has been taking OTC Mucinex, but not is taking Dayquil and Nyquil. Helps with some, but not completely.   Negative home covid test on Friday.  Review of Systems  Respiratory:  Positive for cough.   See HPI above      Objective:   BP 102/64   Pulse 77   Temp 99.6 F (37.6 C)   Ht 5' (1.524 m)   Wt 129 lb 4 oz (58.6 kg)   SpO2 96%   BMI 25.24 kg/m    Physical Exam Vitals reviewed.  Constitutional:      General: She is not in acute distress.    Appearance: Normal appearance. She is not ill-appearing, toxic-appearing or diaphoretic.  HENT:     Head: Normocephalic and atraumatic.     Right Ear: Tympanic membrane, ear canal and external ear normal. There is no impacted cerumen.     Left Ear: Tympanic membrane, ear canal and external ear normal. There is no impacted cerumen.     Mouth/Throat:     Mouth: Mucous membranes are moist.     Pharynx: No oropharyngeal exudate or posterior oropharyngeal erythema.     Comments: Sounds hoarse Eyes:     General:        Right eye: No discharge.        Left eye: No discharge.     Conjunctiva/sclera: Conjunctivae normal.  Cardiovascular:     Rate and Rhythm: Normal rate and regular rhythm.     Heart sounds: Normal heart sounds. No murmur  heard.    No friction rub. No gallop.  Pulmonary:     Effort: Pulmonary effort is normal. No respiratory distress.     Breath sounds: Normal breath sounds.     Comments: Non productive cough during visit Musculoskeletal:        General: Normal range of motion.  Skin:    General: Skin is warm and dry.  Neurological:     General: No focal deficit present.     Mental Status: She is alert and oriented to person, place, and time. Mental status is at baseline.  Psychiatric:        Mood and Affect: Mood normal.        Behavior: Behavior normal.        Thought Content: Thought content normal.        Judgment: Judgment normal.       Assessment & Plan:  Upper respiratory tract infection, unspecified type -     Amoxicillin-Pot Clavulanate; Take 1 tablet by mouth 2 (two) times daily.  Dispense: 20 tablet; Refill: 0 -     Benzonatate; Take 1 capsule (100 mg total) by mouth 3 (three) times daily as  needed for cough.  Dispense: 20 capsule; Refill: 0  -Prescribed Augmentin 875mg -125mg  tablet, 1 tablet evey 12 hours for 10 day since her symptoms have been going on for 8 days with no relief.   -Prescribed Benzonatate 100mg  capsule, 1 tablet every 8 hours as needed for cough.  -Alternate Tylenol 1000mg  and Ibuprofen 600-800mg  every 4 hours for pain, headache, and body aches. Recommend to eat something when taking Ibuprofen.   -Rest -Promote hydration. Drinking 64-100oz of fluids a day, preferably water. -Follow up if not improved with PCP or this location.   Zandra Abts, NP

## 2023-02-02 NOTE — Patient Instructions (Addendum)
-  START Augmentin 875mg -125mg  tablet, 1 tablet evey 12 hours for 10 days.  -START Benzonatate 100mg  capsule, 1 tablet every 8 hours as needed for cough.  -Alternate Tylenol 1000mg  and Ibuprofen 600-800mg  every 4 hours for pain, headache, and body aches. Recommend to eat something when taking Ibuprofen.   -Rest -Promote hydration. Drinking 64-100oz of fluids a day, preferably water. -Follow up if not improved with PCP or this location.

## 2023-03-18 DIAGNOSIS — M25561 Pain in right knee: Secondary | ICD-10-CM | POA: Diagnosis not present

## 2023-03-22 ENCOUNTER — Encounter: Payer: Self-pay | Admitting: Family Medicine

## 2023-03-22 ENCOUNTER — Ambulatory Visit: Payer: Medicare Other | Admitting: Family Medicine

## 2023-03-22 VITALS — BP 126/84 | HR 64 | Temp 97.7°F | Wt 128.4 lb

## 2023-03-22 DIAGNOSIS — L820 Inflamed seborrheic keratosis: Secondary | ICD-10-CM | POA: Diagnosis not present

## 2023-03-22 DIAGNOSIS — L989 Disorder of the skin and subcutaneous tissue, unspecified: Secondary | ICD-10-CM

## 2023-03-22 NOTE — Progress Notes (Signed)
Sara Doyle , May 26, 1946, 77 y.o., female MRN: [id number] Patient Care Team    Relationship Specialty Notifications Start End  Natalia Leatherwood, DO PCP - General Family Medicine  05/10/15   Rachael Fee, MD Attending Physician Gastroenterology  10/05/16   Jethro Bolus, MD Consulting Physician Ophthalmology  10/05/16   Sylvester Harder, DDS Referring Physician Dentistry  08/12/21   Jodelle Red, MD Consulting Physician Cardiology  04/29/22   Sallye Lat, MD Consulting Physician Ophthalmology  04/29/22   Sanjuana Letters, MD Referring Physician Orthopedic Surgery  04/29/22     Chief Complaint  Patient presents with   Nevus    Has been there for a long time, noticed it is rough and red a month ago; right side of breast     Subjective: Sara Doyle is a 77 y.o. Pt presents for an OV with concerns of changing skin lesion lateral right breast. She reports this lesion has been present for years, but recently turned rough an red. She recently had  a BCC removed from the left side of her nose.       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 Nordmeyer lives with her husband, She relocated from [place] Nov., 2015. She is retired, has 3 grown sons that live in [place].   Smoke alarm in the home, wears her seatbelt, uses sunscreen.   Ambulates independently. No dentures.   Has dentist with regular appts.    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   Dr Luiz Blare 05/2018   Hyperlipidemia    Muscle strain of thigh, right, initial encounter 01/27/2017   Vaginal dryness, menopausal 10/04/2015   Past Surgical History:  Procedure Laterality Date   BUNIONECTOMY Right    FRACTURE SURGERY  1969   right leg   FRACTURE SURGERY  2005   right leg   JOINT REPLACEMENT  2019 and L hip 2022   R hip   SKIN LESION EXCISION Right 07/08/2017   Behind right knee, benign   TIBIA FRACTURE SURGERY Right    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, '05   TOTAL HIP ARTHROPLASTY Right 05/20/2018   Procedure: RIGHT TOTAL HIP ARTHROPLASTY ANTERIOR APPROACH;  Surgeon: Jodi Geralds, MD;  Location: [facility];  Service: Orthopedics;  Laterality: Right;   TUBAL LIGATION     Family History  Problem Relation Age of Onset   Hypertension Mother    Miscarriages / [place] Mother    Arthritis Father        osteo   Miscarriages / Stillbirths Sister    Heart disease Brother        congenital   Breast cancer Sister 13       breast  Cancer Sister 76   Arthritis Sister    Hypertension Sister    Heart disease Brother    Cancer Brother    Varicose Veins Brother    Cancer Brother        prostate, colon   Arrhythmia Brother        a-fib   Heart disease Brother    Asthma Son    Colon cancer Neg Hx    Esophageal cancer Neg Hx    Rectal cancer Neg Hx    Stomach cancer Neg Hx    Allergies as of 03/22/2023       Reactions   Oysters [shellfish Allergy] Anaphylaxis, Swelling   Tongue swelling/difficulty breathing   Demerol [meperidine] Nausea And Vomiting        Medication List        Accurate as of March 22, 2023  9:11 AM. If you have any questions, ask your nurse or doctor.          STOP taking these medications    amoxicillin-clavulanate 875-125 MG tablet Commonly known as: AUGMENTIN Stopped by: Felix Pacini   benzonatate 100 MG capsule Commonly known as: TESSALON Stopped by: Felix Pacini       TAKE these medications    b complex vitamins tablet Take 1 tablet by mouth daily.   CALCIUM 500/D PO Take by mouth.   OVER THE COUNTER MEDICATION Areds preservision one tablet daily.   Vitamin D-3 25 MCG (1000 UT) Caps Take 1 capsule by mouth daily.        All past medical history, surgical history, allergies, family history, immunizations andmedications were updated in the EMR today and reviewed under the history and medication portions of their EMR.     ROS Negative, with the exception of above mentioned in HPI   Objective:  BP 126/84   Pulse 64   Temp 97.7 F (36.5 C)   Wt 128 lb 6.4 oz (58.2 kg)   SpO2 95%   BMI 25.08 kg/m  Body mass index is 25.08 kg/m. Physical Exam Vitals and nursing note reviewed.  Constitutional:      General: She is not in acute distress.    Appearance: Normal appearance. She is normal weight. She is not ill-appearing or toxic-appearing.  HENT:     Head: Normocephalic and atraumatic.  Eyes:     General: No scleral icterus.       Right eye: No discharge.        Left eye: No discharge.     Extraocular Movements: Extraocular movements intact.     Conjunctiva/sclera: Conjunctivae normal.     Pupils: Pupils are equal, round, and reactive to light.  Skin:    Findings: Lesion (~81mm rough raised stuck on appearing oval lesion, iritatated, w/ dry blood 9-12 o,clock portion. irritated) present. No rash.  Neurological:     Mental Status: She is alert and oriented to person, place, and time. Mental status is at baseline.     Motor: No weakness.     Coordination: Coordination normal.     Gait: Gait normal.  Psychiatric:        Mood and Affect: Mood normal.        Behavior: Behavior normal.        Thought Content: Thought content normal.        Judgment: Judgment normal.      No results found. No results found. No results found for this or any previous visit (from the past 24 hour(s)).  Assessment/Plan: Sara Doyle is a 77 y.o. 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  Clean with warm soapy water and [lace BB ointment. Lesion should return to its normal presentation in 1 -2 weeks, if not then we can get her to derm sooner.  Reviewed expectations re: course of current medical issues. Discussed self-management of symptoms. Outlined signs and symptoms indicating need for more acute intervention. Patient verbalized understanding and all questions were answered. Patient received an After-Visit Summary.    No orders of the defined types were placed in this encounter.  No orders of the defined types were placed in this encounter.  Referral Orders  No referral(s) requested today     Note is dictated utilizing voice recognition software. Although note has been proof read prior to signing, occasional typographical errors still can be missed. If any questions arise, please do not hesitate to call for verification.   electronically signed by:  Felix Pacini, DO  [facility] [place]

## 2023-03-22 NOTE — Patient Instructions (Signed)
No follow-ups on file.        Great to see you today.  I have refilled the medication(s) we provide.   If labs were collected, we will inform you of lab results once received either by echart message or telephone call.   - echart message- for normal results that have been seen by the patient already.   - telephone call: abnormal results or if patient has not viewed results in their echart.  

## 2023-04-27 DIAGNOSIS — M1711 Unilateral primary osteoarthritis, right knee: Secondary | ICD-10-CM | POA: Diagnosis not present

## 2023-05-28 ENCOUNTER — Other Ambulatory Visit (HOSPITAL_BASED_OUTPATIENT_CLINIC_OR_DEPARTMENT_OTHER): Payer: Self-pay

## 2023-05-28 DIAGNOSIS — Z23 Encounter for immunization: Secondary | ICD-10-CM | POA: Diagnosis not present

## 2023-05-28 MED ORDER — COVID-19 MRNA VAC-TRIS(PFIZER) 30 MCG/0.3ML IM SUSY
0.3000 mL | PREFILLED_SYRINGE | Freq: Once | INTRAMUSCULAR | 0 refills | Status: AC
  Filled 2023-05-28: qty 0.3, 1d supply, fill #0

## 2023-05-28 MED ORDER — INFLUENZA VAC A&B SURF ANT ADJ 0.5 ML IM SUSY
0.5000 mL | PREFILLED_SYRINGE | Freq: Once | INTRAMUSCULAR | 0 refills | Status: AC
  Filled 2023-05-28: qty 0.5, 1d supply, fill #0

## 2023-06-08 DIAGNOSIS — H2513 Age-related nuclear cataract, bilateral: Secondary | ICD-10-CM | POA: Diagnosis not present

## 2023-06-08 DIAGNOSIS — H35372 Puckering of macula, left eye: Secondary | ICD-10-CM | POA: Diagnosis not present

## 2023-06-13 IMAGING — MG MM DIGITAL SCREENING BILAT W/ TOMO AND CAD
8 series · 9 of 24 positions shown · non-contrast
Comparison: Previous exam(s).

CLINICAL DATA: Screening.

EXAM:
DIGITAL SCREENING BILATERAL MAMMOGRAM WITH TOMOSYNTHESIS AND CAD
TECHNIQUE: Bilateral screening digital craniocaudal and mediolateral oblique
mammograms were obtained. Bilateral screening digital breast
tomosynthesis was performed. The images were evaluated with
computer-aided detection.

[L MLO synth-2D]
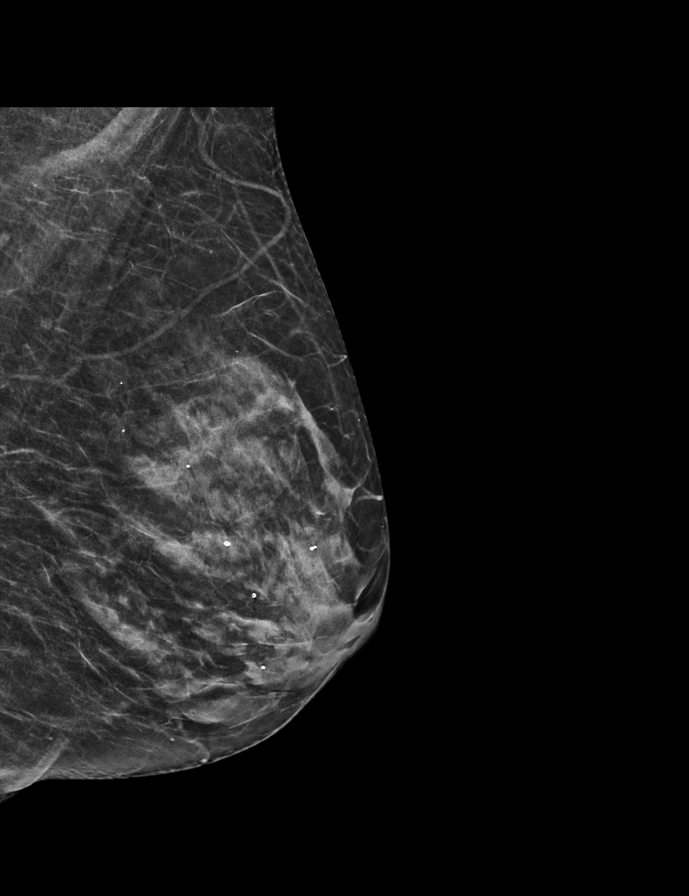

[L CC synth-2D]
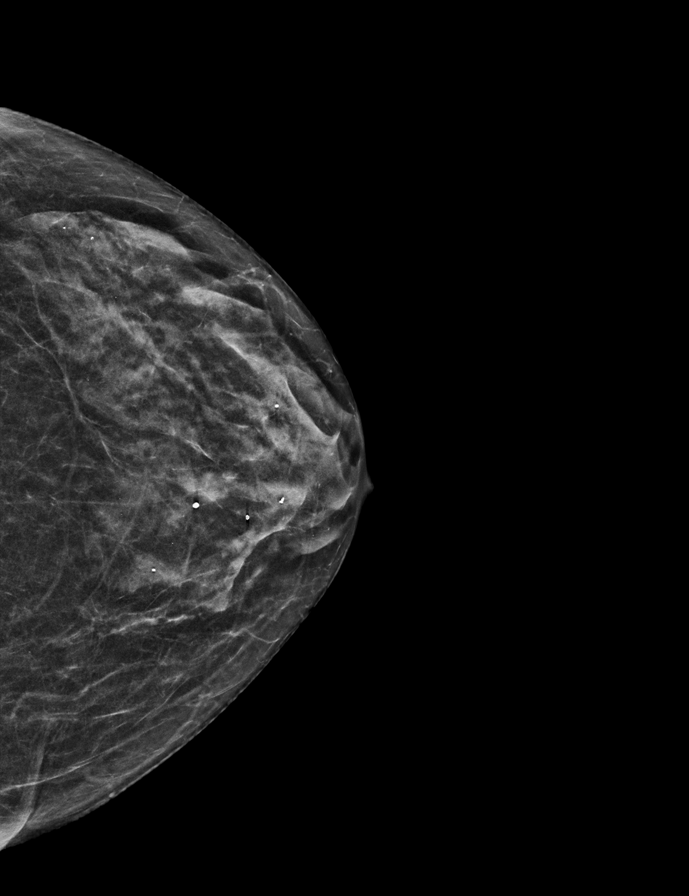

[R CC synth-2D]
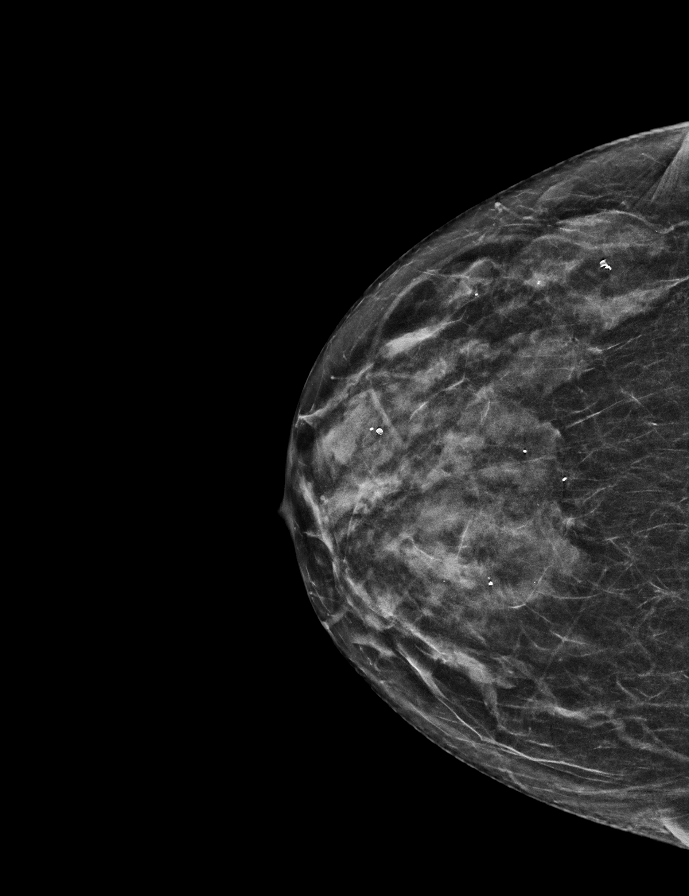

[R MLO synth-2D]
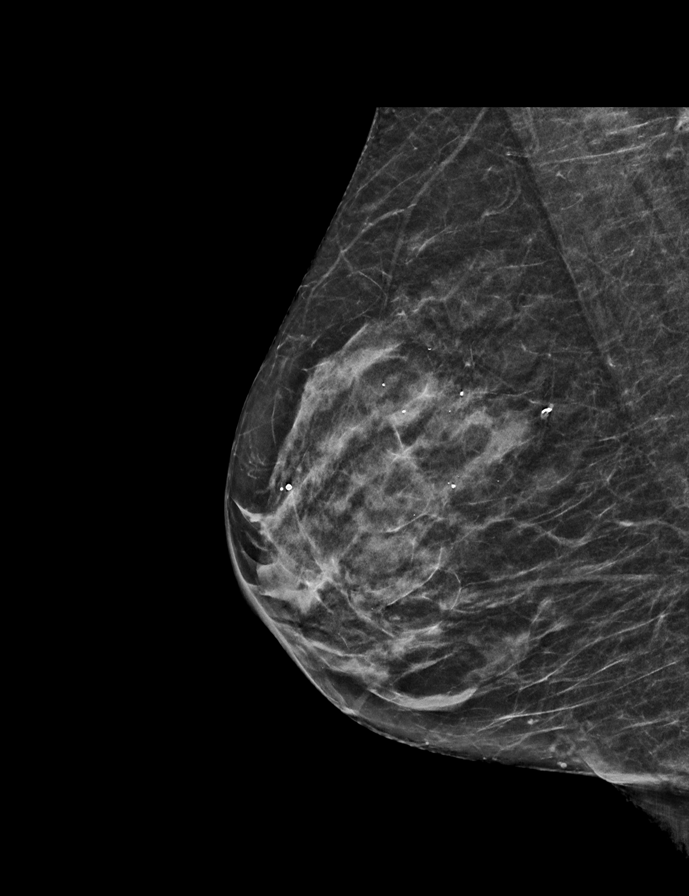

[L CC tomo · 2 of 45 frames shown]
[frame 15/45]
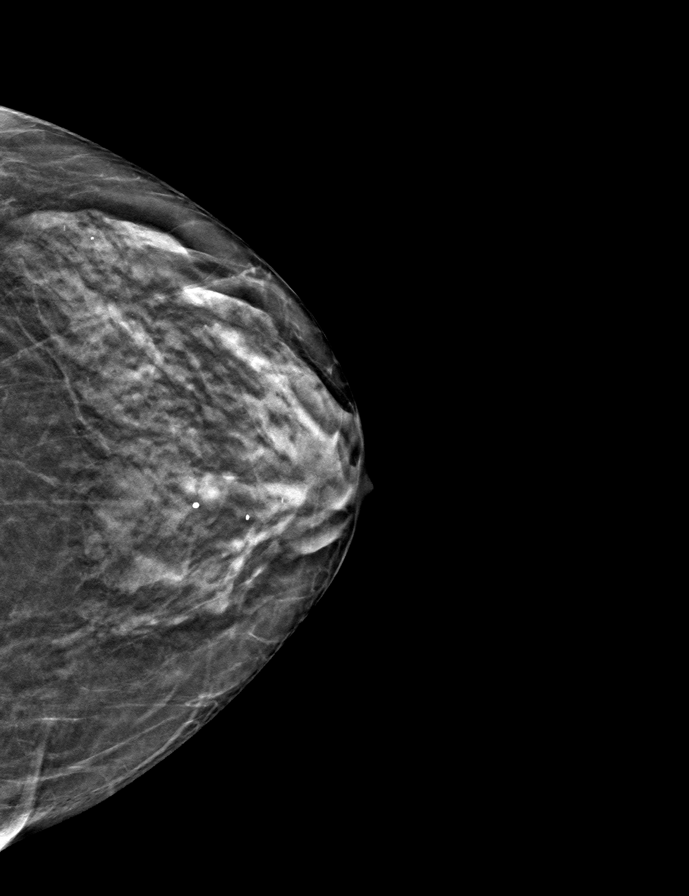
[frame 23/45]
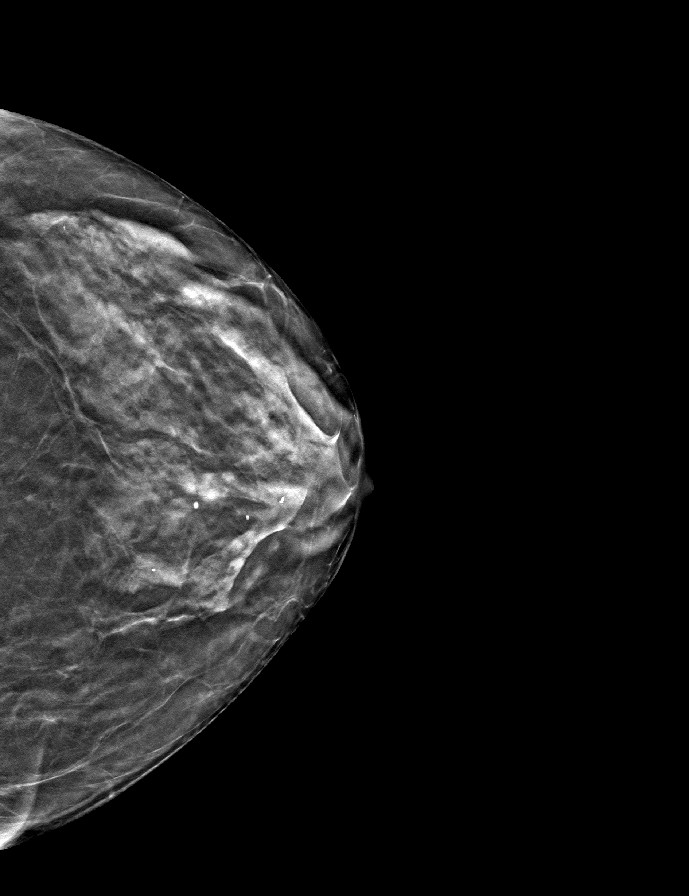

[L MLO tomo · tomo slice 23/44.0]
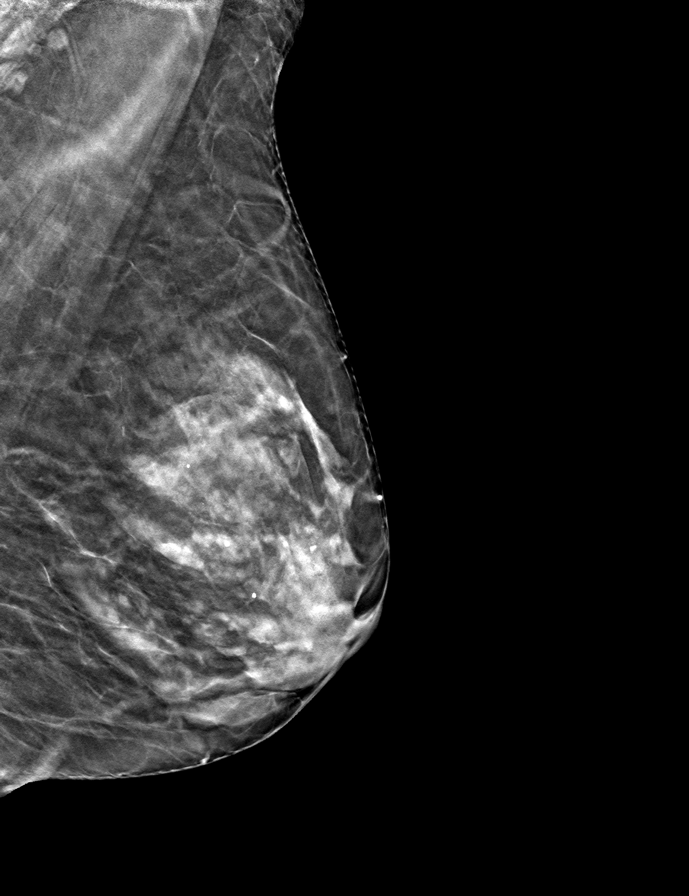

[R CC tomo · tomo slice 25/48.0]
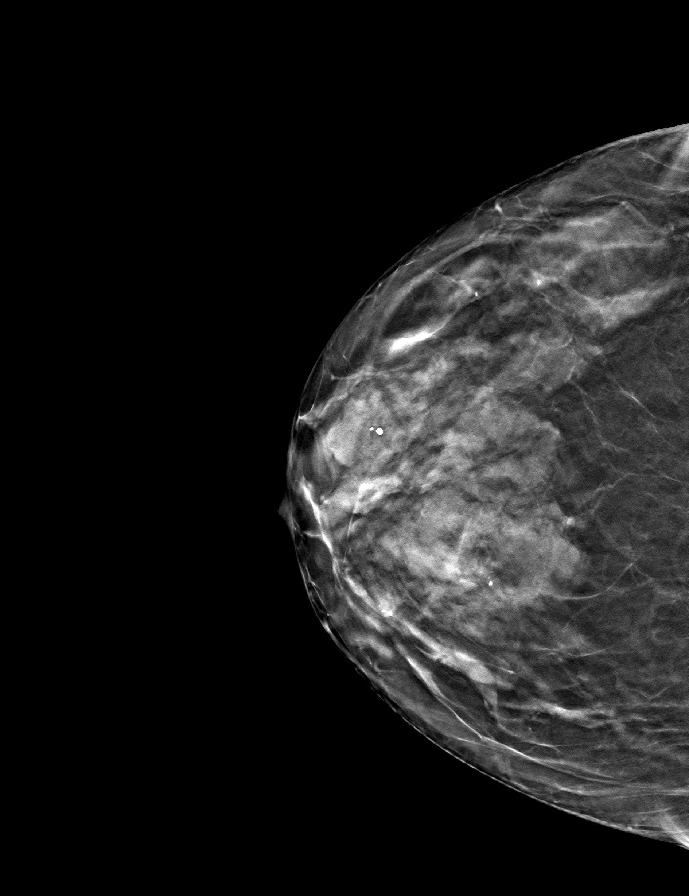

[R MLO tomo · tomo slice 24/47.0]
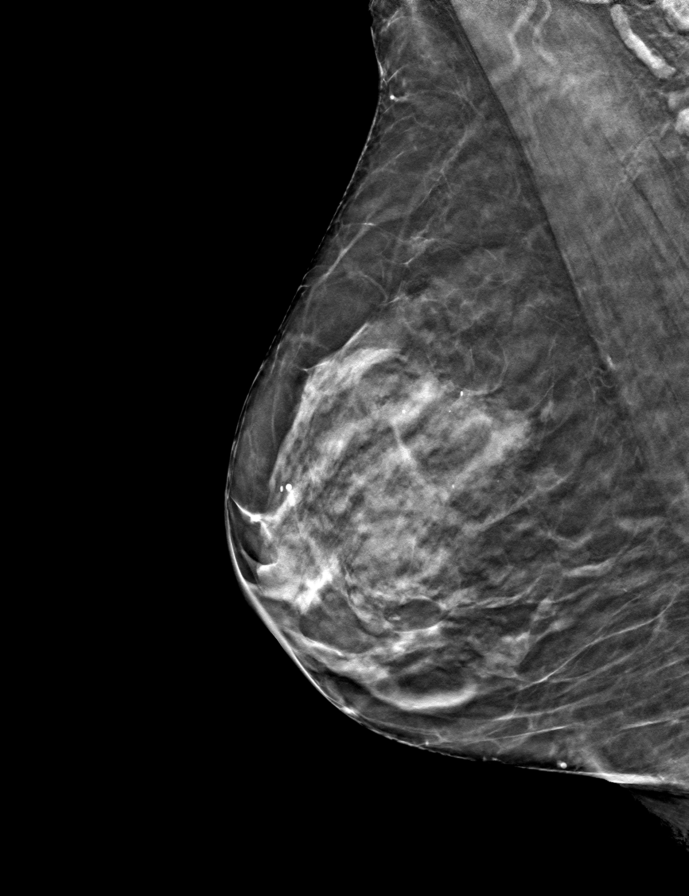

[9 of 24 positions shown; findings below may reference images not displayed]

ACR Breast Density Category c: The breast tissue is heterogeneously
dense, which may obscure small masses.
FINDINGS: There are no findings suspicious for malignancy.
IMPRESSION: No mammographic evidence of malignancy. A result letter of this
screening mammogram will be mailed directly to the patient.

RECOMMENDATION:
Screening mammogram in one year. (Code:Q3-W-BC3)

BI-RADS CATEGORY  1: Negative.

## 2023-07-29 ENCOUNTER — Other Ambulatory Visit: Payer: Self-pay | Admitting: Medical Genetics

## 2023-07-29 DIAGNOSIS — Z006 Encounter for examination for normal comparison and control in clinical research program: Secondary | ICD-10-CM

## 2023-09-14 ENCOUNTER — Other Ambulatory Visit (HOSPITAL_COMMUNITY)
Admission: RE | Admit: 2023-09-14 | Discharge: 2023-09-14 | Disposition: A | Discharge status: Home / Self Care | Payer: Self-pay | Source: Ambulatory Visit | Attending: Oncology | Admitting: Oncology

## 2023-09-14 DIAGNOSIS — Z006 Encounter for examination for normal comparison and control in clinical research program: Secondary | ICD-10-CM | POA: Insufficient documentation

## 2023-09-27 LAB — GENECONNECT MOLECULAR SCREEN: Genetic Analysis Overall Interpretation: NEGATIVE

## 2023-10-07 DIAGNOSIS — M1711 Unilateral primary osteoarthritis, right knee: Secondary | ICD-10-CM | POA: Diagnosis not present

## 2023-11-04 DIAGNOSIS — H35372 Puckering of macula, left eye: Secondary | ICD-10-CM | POA: Diagnosis not present

## 2023-11-04 DIAGNOSIS — H2513 Age-related nuclear cataract, bilateral: Secondary | ICD-10-CM | POA: Diagnosis not present

## 2023-11-08 ENCOUNTER — Ambulatory Visit (INDEPENDENT_AMBULATORY_CARE_PROVIDER_SITE_OTHER): Payer: Medicare Other | Admitting: Family Medicine

## 2023-11-08 ENCOUNTER — Ambulatory Visit: Attending: Family Medicine

## 2023-11-08 ENCOUNTER — Encounter: Payer: Self-pay | Admitting: Family Medicine

## 2023-11-08 VITALS — BP 103/65 | HR 67 | Ht 60.0 in | Wt 130.0 lb

## 2023-11-08 DIAGNOSIS — M8589 Other specified disorders of bone density and structure, multiple sites: Secondary | ICD-10-CM

## 2023-11-08 DIAGNOSIS — E559 Vitamin D deficiency, unspecified: Secondary | ICD-10-CM | POA: Diagnosis not present

## 2023-11-08 DIAGNOSIS — R002 Palpitations: Secondary | ICD-10-CM | POA: Diagnosis not present

## 2023-11-08 DIAGNOSIS — R7309 Other abnormal glucose: Secondary | ICD-10-CM

## 2023-11-08 DIAGNOSIS — I499 Cardiac arrhythmia, unspecified: Secondary | ICD-10-CM | POA: Insufficient documentation

## 2023-11-08 DIAGNOSIS — E7800 Pure hypercholesterolemia, unspecified: Secondary | ICD-10-CM

## 2023-11-08 LAB — VITAMIN D 25 HYDROXY (VIT D DEFICIENCY, FRACTURES): VITD: 37.74 ng/mL (ref 30.00–100.00)

## 2023-11-08 LAB — CBC
HCT: 43.3 % (ref 36.0–46.0)
Hemoglobin: 14.6 g/dL (ref 12.0–15.0)
MCHC: 33.6 g/dL (ref 30.0–36.0)
MCV: 103.3 fl — ABNORMAL HIGH (ref 78.0–100.0)
Platelets: 225 10*3/uL (ref 150.0–400.0)
RBC: 4.19 Mil/uL (ref 3.87–5.11)
RDW: 13.3 % (ref 11.5–15.5)
WBC: 3.7 10*3/uL — ABNORMAL LOW (ref 4.0–10.5)

## 2023-11-08 LAB — TSH: TSH: 2.89 u[IU]/mL (ref 0.35–5.50)

## 2023-11-08 LAB — HEMOGLOBIN A1C: Hgb A1c MFr Bld: 5.3 % (ref 4.6–6.5)

## 2023-11-08 NOTE — Patient Instructions (Addendum)
 Return in about 1 year (around 11/08/2024) for Routine chronic condition follow-up.        Great to see you today.  I have refilled the medication(s) we provide.   If labs were collected or images ordered, we will inform you of  results once we have received them and reviewed. We will contact you either by echart message, or telephone call.  Please give ample time to the testing facility, and our office to run,  receive and review results. Please do not call inquiring of results, even if you can see them in your chart. We will contact you as soon as we are able. If it has been over 1 week since the test was completed, and you have not yet heard from [place], then please call us.    - echart message- for normal results that have been seen by the patient already.   - telephone call: abnormal results or if patient has not viewed results in their echart.  If a referral to a specialist was entered for you, please call us in 2 weeks if you have not heard from the specialist office to schedule.

## 2023-11-08 NOTE — Progress Notes (Signed)
 Patient ID: YAN OKRAY, female  DOB: 08-10-1946, 78 y.o.   MRN: [id number] Patient Care Team    Relationship Specialty Notifications Start End  Natalia Leatherwood, DO PCP - General Family Medicine  05/10/15   Rachael Fee, MD (Inactive) Attending Physician Gastroenterology  10/05/16   Sylvester Harder, DDS Referring Physician Dentistry  08/12/21   Jodelle Red, MD Consulting Physician Cardiology  04/29/22   Sallye Lat, MD Consulting Physician Ophthalmology  04/29/22   Sanjuana Letters, MD Referring Physician Orthopedic Surgery  04/29/22     Chief Complaint  Patient presents with   Osteopenia    Chronic condition management; pt is having cataract surgery next month    Subjective: Sara Doyle is a 78 y.o.  Female  present for Chronic Conditions/illness Management  All past medical history, surgical history, allergies, family history, immunizations, medications and social history were updated in the electronic medical record today. All recent labs, ED visits and hospitalizations within the last year were reviewed.  Osteopenia, unspecified location pt taking vit d supplement. T-score of -1.6 (04/2022) This patient is considered osteopenic. Due 2026  Elevated cholesterol/fhx heart disease in bother.  Has been diet controlled the past.  However her last LDL did increase to put her at a mild elevation in cardiac risk over normal population.  She attempted to start low-dose Crestor 5 mg every other day and was unable to tolerate secondary to myalgias.      11/08/2023    8:36 AM 11/09/2022    1:06 PM 11/04/2022    9:18 AM 11/05/2021    1:50 PM 11/03/2021    1:03 PM  Depression screen PHQ 2/9  Decreased Interest 0 0 0 0 0  Down, Depressed, Hopeless 1 0 0 0 0  PHQ - 2 Score 1 0 0 0 0  Altered sleeping 2      Tired, decreased energy 0      Change in appetite 0      Feeling bad or failure about yourself  0      Trouble concentrating 0      Moving slowly or  fidgety/restless 0      Suicidal thoughts 0      PHQ-9 Score 3      Difficult doing work/chores Not difficult at all          11/08/2023    8:36 AM 11/04/2022    9:19 AM  GAD 7 : Generalized Anxiety Score  Nervous, Anxious, on Edge 0 0  Control/stop worrying 0 0  Worry too much - different things 1 0  Trouble relaxing 0 0  Restless 0 0  Easily annoyed or irritable 0 0  Afraid - awful might happen 0 0  Total GAD 7 Score 1 0  Anxiety Difficulty Not difficult at all      Immunization History  Administered Date(s) Administered   Fluad Trivalent(High Dose 65+) 05/28/2023   Influenza, High Dose Seasonal PF 07/04/2018, 06/17/2019, 06/01/2022   Influenza-Unspecified 06/23/2015, 06/21/2016, 06/07/2017, 07/24/2020, 06/07/2021   PFIZER(Purple Top)SARS-COV-2 Vaccination 09/27/2019, 10/16/2019, 07/02/2020, 02/07/2021   Pfizer Covid-19 Vaccine Bivalent Booster 40yrs & up 06/10/2021   Pfizer(Comirnaty)Fall Seasonal Vaccine 12 years and older 05/28/2023   Pneumococcal Conjugate-13 02/12/2014   Pneumococcal Polysaccharide-23 01/06/2011   Respiratory Syncytial Virus Vaccine,Recomb Aduvanted(Arexvy) 08/03/2022   Tdap 01/06/2003, 03/07/2021   Unspecified SARS-COV-2 Vaccination 06/01/2022   Zoster Recombinant(Shingrix) 01/01/2020, 04/12/2020    Past Medical History:  Diagnosis Date   Allergy  Arthritis 2019   Basal cell carcinoma 11/26/2022   Left nasal sidewall - needs Mohs   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   Dr Luiz Blare 05/2018   Hyperlipidemia    Muscle strain of thigh, right, initial encounter 01/27/2017   Vaginal dryness, menopausal 10/04/2015   Allergies  Allergen Reactions   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 and Swelling    Tongue swelling/difficulty breathing   Demerol [Meperidine] Nausea And Vomiting   Past Surgical History:  Procedure Laterality Date   BUNIONECTOMY Right    FRACTURE SURGERY  1969   right leg   FRACTURE SURGERY  2005   right leg    JOINT REPLACEMENT  2019 and L hip 2022   R hip   SKIN LESION EXCISION Right 07/08/2017   Behind right knee, benign   TIBIA FRACTURE SURGERY Right    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, '05   TOTAL HIP ARTHROPLASTY Right 05/20/2018   Procedure: RIGHT TOTAL HIP ARTHROPLASTY ANTERIOR APPROACH;  Surgeon: Jodi Geralds, MD;  Location: [facility];  Service: Orthopedics;  Laterality: Right;   TUBAL LIGATION     Family History  Problem Relation Age of Onset   Hypertension Mother    Miscarriages / [place] Mother    Arthritis Father        osteo   Miscarriages / Stillbirths Sister    Heart disease Brother        congenital   Breast cancer Sister 47       breast   Cancer Sister 64   Arthritis Sister    Hypertension Sister    Heart disease Brother    Cancer Brother    Varicose Veins Brother    Cancer Brother        prostate, colon   Arrhythmia Brother        a-fib   Heart disease Brother    Asthma Son    Colon cancer Neg Hx    Esophageal cancer Neg Hx    Rectal cancer Neg Hx    Stomach cancer Neg Hx    Social History   Social History Narrative   Sara Doyle lives with her husband, She relocated from [place] Nov., 2015. She is retired, has 3 grown sons that live in [place].   Smoke alarm in the home, wears her seatbelt, uses sunscreen.   Ambulates independently. No dentures.   Has dentist with regular appts.    Feels safe in her relationships    Allergies as of 11/08/2023       Reactions   Oysters [shellfish Allergy] Anaphylaxis, Swelling   Tongue swelling/difficulty breathing   Demerol [meperidine] Nausea And Vomiting        Medication List        Accurate as of November 08, 2023  9:03 AM. If you have any questions, ask your nurse or doctor.          b complex vitamins tablet Take 1 tablet by mouth daily.   CALCIUM 500/D PO Take by mouth.   OVER THE COUNTER MEDICATION Areds preservision one tablet daily.   Vitamin D-3 25 MCG (1000 UT) Caps Take 1 capsule by mouth daily.         All past medical history, surgical history, allergies, family history, immunizations andmedications were updated in the EMR today and reviewed under the history and medication portions of their EMR.      ROS: 14 pt review of systems performed and negative (unless mentioned in an HPI)  Objective: BP  103/65   Pulse 67   Ht 5' (1.524 m)   Wt 130 lb (59 kg)   SpO2 97%   BMI 25.39 kg/m  Physical Exam Vitals and nursing note reviewed.  Constitutional:      General: She is not in acute distress.    Appearance: Normal appearance. She is not ill-appearing, toxic-appearing or diaphoretic.  HENT:     Head: Normocephalic and atraumatic.  Eyes:     General: No scleral icterus.       Right eye: No discharge.        Left eye: No discharge.     Extraocular Movements: Extraocular movements intact.     Conjunctiva/sclera: Conjunctivae normal.     Pupils: Pupils are equal, round, and reactive to light.  Cardiovascular:     Rate and Rhythm: Normal rate. Rhythm irregular.     Heart sounds: No murmur heard.    No gallop.  Pulmonary:     Effort: Pulmonary effort is normal. No respiratory distress.     Breath sounds: Normal breath sounds. No wheezing, rhonchi or rales.  Musculoskeletal:     Cervical back: Neck supple. No tenderness.     Right lower leg: No edema.     Left lower leg: No edema.  Lymphadenopathy:     Cervical: No cervical adenopathy.  Skin:    General: Skin is warm and dry.     Coloration: Skin is not jaundiced or pale.     Findings: No erythema or rash.  Neurological:     Mental Status: She is alert and oriented to person, place, and time. Mental status is at baseline.     Motor: No weakness.     Gait: Gait normal.  Psychiatric:        Mood and Affect: Mood normal.        Behavior: Behavior normal.        Thought Content: Thought content normal.        Judgment: Judgment normal.    No results found.  Assessment/plan: RUEY STORER is a 78 y.o. female  present for Chronic Conditions/illness Management Osteopenia, unspecified location Bone density (-1.6) rpt due 2026-breast center [facility] Continue vit d supplement.  Exercise routinely.former smoker.  Vitamin D collected today  Elevated glucose: A1c collected today  Hyperlipidemia: Unable to tolerate even low-dose every other day Crestor. dietary modifications discussed in detail. Lipid, CBC, CMP collected today  Irregular heart beat/palpitations Zio monitor ordered. Consider cardio referral vs asa 81 Pt reports her watch states a.fib burden < 2%, she has never been formally diagnosed with a.fib in the past.   Breast cancer screening/Health maintenance: Completed 11/18/2022, patient desires every other year screening Due 11/2024-breast center in [place]   Return in about 1 year (around 11/08/2024) for Routine chronic condition follow-up.  Orders Placed This Encounter  Procedures   CBC   Comprehensive metabolic panel   Hemoglobin A1c   TSH   Lipid panel   Vitamin D (25 hydroxy)   LONG TERM MONITOR (3-14 DAYS)   No orders of the defined types were placed in this encounter.  Referral Orders  No referral(s) requested today      Electronically signed by: Felix Pacini, DO [facility]

## 2023-11-08 NOTE — Progress Notes (Unsigned)
 EP to read.

## 2023-11-09 ENCOUNTER — Encounter: Payer: Self-pay | Admitting: Family Medicine

## 2023-11-09 LAB — COMPREHENSIVE METABOLIC PANEL
ALT: 13 U/L (ref 0–35)
AST: 18 U/L (ref 0–37)
Albumin: 4.5 g/dL (ref 3.5–5.2)
Alkaline Phosphatase: 73 U/L (ref 39–117)
BUN: 16 mg/dL (ref 6–23)
CO2: 29 meq/L (ref 19–32)
Calcium: 9.6 mg/dL (ref 8.4–10.5)
Chloride: 96 meq/L (ref 96–112)
Creatinine, Ser: 0.71 mg/dL (ref 0.40–1.20)
GFR: 81.57 mL/min (ref >60.00)
Glucose, Bld: 81 mg/dL (ref 70–99)
Potassium: 4.4 meq/L (ref 3.5–5.1)
Sodium: 135 meq/L (ref 135–145)
Total Bilirubin: 0.6 mg/dL (ref 0.2–1.2)
Total Protein: 7.2 g/dL (ref 6.0–8.3)

## 2023-11-09 LAB — LIPID PANEL
Cholesterol: 241 mg/dL — ABNORMAL HIGH (ref 0–200)
HDL: 107.7 mg/dL (ref >39.00)
LDL Cholesterol: 120 mg/dL — ABNORMAL HIGH (ref 0–99)
NonHDL: 133.03
Total CHOL/HDL Ratio: 2
Triglycerides: 66 mg/dL (ref 0.0–149.0)
VLDL: 13.2 mg/dL (ref 0.0–40.0)

## 2023-11-10 ENCOUNTER — Encounter: Payer: Self-pay | Admitting: Family Medicine

## 2023-11-10 ENCOUNTER — Encounter (INDEPENDENT_AMBULATORY_CARE_PROVIDER_SITE_OTHER): Payer: Self-pay

## 2023-11-11 DIAGNOSIS — I499 Cardiac arrhythmia, unspecified: Secondary | ICD-10-CM

## 2023-11-11 DIAGNOSIS — R002 Palpitations: Secondary | ICD-10-CM | POA: Diagnosis not present

## 2023-11-26 DIAGNOSIS — H25811 Combined forms of age-related cataract, right eye: Secondary | ICD-10-CM | POA: Diagnosis not present

## 2023-11-26 DIAGNOSIS — H52221 Regular astigmatism, right eye: Secondary | ICD-10-CM | POA: Diagnosis not present

## 2023-12-08 DIAGNOSIS — R002 Palpitations: Secondary | ICD-10-CM | POA: Diagnosis not present

## 2023-12-08 DIAGNOSIS — H2512 Age-related nuclear cataract, left eye: Secondary | ICD-10-CM | POA: Diagnosis not present

## 2023-12-08 DIAGNOSIS — I499 Cardiac arrhythmia, unspecified: Secondary | ICD-10-CM | POA: Diagnosis not present

## 2023-12-09 ENCOUNTER — Telehealth: Payer: Self-pay | Admitting: Family Medicine

## 2023-12-09 DIAGNOSIS — I471 Supraventricular tachycardia, unspecified: Secondary | ICD-10-CM | POA: Insufficient documentation

## 2023-12-09 NOTE — Telephone Encounter (Signed)
 Please call patient Her ZIO monitoring did not show any evidence of a sustained irregular heart rhythm or atrial fibs. She did have a rather significant amount of supraventricular tachycardia episodes (971 nonsustained SVTs-longest 18 beats) -This can cause dizziness, shortness of breath, fatigue, lightheadedness or passing out.   I placed a referral to electrophysiology/cardiology, which a cardiologist that specializes in the electrical activity of the heart to discuss next step for further workup.

## 2023-12-10 DIAGNOSIS — H25812 Combined forms of age-related cataract, left eye: Secondary | ICD-10-CM | POA: Diagnosis not present

## 2023-12-27 DIAGNOSIS — M1711 Unilateral primary osteoarthritis, right knee: Secondary | ICD-10-CM | POA: Diagnosis not present

## 2024-02-01 ENCOUNTER — Telehealth: Payer: Self-pay

## 2024-02-01 NOTE — Telephone Encounter (Signed)
 Reason for CRM/Communication:   Reason for CRM: pt called for advice, she was bitten by a cat and scratched, wanted to  know if she should be concerned, being that she had a hip replacement. Wanted to know if she could get a infection as a result please call pt back at [phone]   Called and spoke with pt. Pt bitten/scratched last night by her daughter's cat. Pt stated as far as she knew the cat is vaccinated, but overall is unsure. The bite/scratch site is mildly red. Pt is applying OTC antibiotic ointment. Offered pt appt, pt declined at the time of call due to "wanting to see what Dr. Marylee Snowball says first." Please advise.

## 2024-02-29 DIAGNOSIS — M7061 Trochanteric bursitis, right hip: Secondary | ICD-10-CM | POA: Diagnosis not present

## 2024-03-14 ENCOUNTER — Ambulatory Visit: Admitting: Cardiovascular Disease

## 2024-03-15 NOTE — Progress Notes (Signed)
 Electrophysiology Office Note:   Date:  03/18/2024  ID:  Sara Doyle, Sara Doyle Jan 01, 1946, MRN [id number]  Primary Cardiologist: None Electrophysiologist: Fonda Kitty, MD      History of Present Illness:   Discussed the use of AI scribe software for clinical note transcription with the patient, who gave verbal consent to proceed.  History of Present Illness Sara Doyle is a 78 year old female who presents for evaluation of heart rhythm irregularities after wearing a heart monitor. She was referred by her primary care doctor for evaluation of an irregular heart rhythm detected during an annual physical.  During her annual physical, an irregularity in her heart rhythm was noted, prompting the use of a heart monitor. No significant symptoms such as palpitations, chest pain, or syncope were reported at the time of the referral.  She has a history of syncope episodes, with the last occurrence being three years ago, attributed to dehydration and not eating before exercise. Since then, she has been eating breakfast before workouts and has not experienced further episodes.  The heart monitor recorded a range of heart rates from 54 to 145 beats per minute, with an average of 78. Ectopic beats were noted, with 3-4% of the time having extra beats from the top chamber and less than 1% from the bottom chamber. She did not report feeling these beats except for one brief episode around lunchtime.  She exercises regularly and has not experienced any prolonged episodes of irregular heartbeats. Her longest episode recorded was 18 beats, lasting only a few seconds.  No recent symptoms such as feeling her heart skipping, chest pain, or syncope.   Review of systems complete and found to be negative unless listed in HPI.   EP Information / Studies Reviewed:    EKG is ordered today. Personal review as below.  EKG Interpretation Date/Time:  Thursday March 16 2024 10:31:12 EDT Ventricular Rate:  69 PR  Interval:  198 QRS Duration:  74 QT Interval:  414 QTC Calculation: 443 R Axis:   69  Text Interpretation: Sinus rhythm with sinus arrhythmia with occasional Premature ventricular complexes When compared with ECG of 07-Aug-2021 13:04, Premature ventricular complexes are now Present Confirmed by Kitty Fonda [phone]) on 03/18/2024 12:25:18 AM  Zio 11/2023:    Echo 10/08/2021:  1. Left ventricular ejection fraction, by estimation, is 55 to 60%. The  left ventricle has normal function. The left ventricle has no regional  wall motion abnormalities. Left ventricular diastolic parameters were  normal.   2. Right ventricular systolic function is normal. The right ventricular  size is normal. There is normal pulmonary artery systolic pressure. The  estimated right ventricular systolic pressure is 27.8 mmHg.   3. The mitral valve is grossly normal. Trivial mitral valve  regurgitation. No evidence of mitral stenosis.   4. The aortic valve is tricuspid. Aortic valve regurgitation is not  visualized. No aortic stenosis is present.   5. The inferior vena cava is normal in size with greater than 50%  respiratory variability, suggesting right atrial pressure of 3 mmHg.    Physical Exam:   VS:  BP 125/82   Pulse 69   Ht 5' (1.524 m)   Wt 127 lb (57.6 kg)   SpO2 97%   BMI 24.80 kg/m    Wt Readings from Last 3 Encounters:  03/16/24 127 lb (57.6 kg)  11/08/23 130 lb (59 kg)  03/22/23 128 lb 6.4 oz (58.2 kg)     GEN: Well nourished, well  developed in no acute distress NECK: No JVD CARDIAC: Normal rate, regular rhythm RESPIRATORY:  Clear to auscultation without rales, wheezing or rhonchi  ABDOMEN: Soft, non-distended EXTREMITIES:  No edema; No deformity   ASSESSMENT AND PLAN:    #. 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  #. PACs: Occasional on Zio, burden ~4.4%.  #. PVCs: <1% burden on Zio.  - Patient is asymptomatic and thus far has not demonstrated sustained arrhythmias. Nothing has been  observed that would explain syncope, more likely orthostatic or vasovagal. Benefits of procedural interventions or medications at this time do not outweigh the risks. If she develops symptoms associated with non-sustained events or PACs then can consider trialing low dose beta-blocker. If she has sustained SVT then EP study and ablation would be reasonable.  - Monitor for symptoms such as palpitations, dizziness, or syncope. Advised to report if episodes become symptomatic or if heart rate is consistently above 100 bpm at rest.  Follow up with Dr. Kennyth as needed.   Signed, Fonda Kennyth, MD

## 2024-03-16 ENCOUNTER — Ambulatory Visit: Attending: Cardiology | Admitting: Cardiology

## 2024-03-16 ENCOUNTER — Encounter: Payer: Self-pay | Admitting: Cardiology

## 2024-03-16 VITALS — BP 125/82 | HR 69 | Ht 60.0 in | Wt 127.0 lb

## 2024-03-16 DIAGNOSIS — I491 Atrial premature depolarization: Secondary | ICD-10-CM | POA: Diagnosis not present

## 2024-03-16 DIAGNOSIS — R002 Palpitations: Secondary | ICD-10-CM | POA: Diagnosis not present

## 2024-03-16 DIAGNOSIS — I493 Ventricular premature depolarization: Secondary | ICD-10-CM | POA: Diagnosis not present

## 2024-03-16 DIAGNOSIS — I471 Supraventricular tachycardia, unspecified: Secondary | ICD-10-CM | POA: Insufficient documentation

## 2024-03-16 NOTE — Patient Instructions (Signed)

## 2024-03-28 DIAGNOSIS — M1711 Unilateral primary osteoarthritis, right knee: Secondary | ICD-10-CM | POA: Diagnosis not present

## 2024-03-28 DIAGNOSIS — M25551 Pain in right hip: Secondary | ICD-10-CM | POA: Diagnosis not present

## 2024-05-21 ENCOUNTER — Encounter: Payer: Self-pay | Admitting: Family Medicine

## 2024-05-22 ENCOUNTER — Ambulatory Visit: Payer: Self-pay | Admitting: Internal Medicine

## 2024-05-22 ENCOUNTER — Encounter: Payer: Self-pay | Admitting: Internal Medicine

## 2024-05-22 ENCOUNTER — Ambulatory Visit: Admitting: Internal Medicine

## 2024-05-22 VITALS — BP 132/82 | HR 64 | Temp 97.9°F | Ht 60.0 in | Wt 132.8 lb

## 2024-05-22 DIAGNOSIS — R3915 Urgency of urination: Secondary | ICD-10-CM | POA: Diagnosis not present

## 2024-05-22 DIAGNOSIS — R7309 Other abnormal glucose: Secondary | ICD-10-CM

## 2024-05-22 DIAGNOSIS — E559 Vitamin D deficiency, unspecified: Secondary | ICD-10-CM | POA: Diagnosis not present

## 2024-05-22 LAB — URINALYSIS, ROUTINE W REFLEX MICROSCOPIC
Bilirubin Urine: NEGATIVE
Hgb urine dipstick: NEGATIVE
Ketones, ur: NEGATIVE
Nitrite: NEGATIVE
RBC / HPF: NONE SEEN (ref 0–?)
Specific Gravity, Urine: 1.01 (ref 1.000–1.030)
Total Protein, Urine: NEGATIVE
Urine Glucose: NEGATIVE
Urobilinogen, UA: 0.2 (ref 0.0–1.0)
pH: 7 (ref 5.0–8.0)

## 2024-05-22 MED ORDER — CEPHALEXIN 500 MG PO CAPS: 500.0000 mg | ORAL_CAPSULE | Freq: Three times a day (TID) | ORAL | 0 refills | Status: DC

## 2024-05-22 NOTE — Assessment & Plan Note (Signed)
 Last vitamin D  Lab Results  Component Value Date   VD25OH 37.74 11/08/2023   Low normal, for oral replacement

## 2024-05-22 NOTE — Assessment & Plan Note (Signed)
 Etiology unclear, for ua and cx, and for cephalexin  course if abnrmal

## 2024-05-22 NOTE — Assessment & Plan Note (Signed)
 Lab Results  Component Value Date   HGBA1C 5.3 11/08/2023   Stable, pt to continue current medical treatment  - diet ,wt control

## 2024-05-22 NOTE — Patient Instructions (Signed)
 Ok to pick up the antibiotic, but we also need to check the urine testing to see if it is needed  Please continue all other medications as before, and refills have been done if requested.  Please have the pharmacy call with any other refills you may need.  Please keep your appointments with your specialists as you may have planned  Please go to the LAB at the blood drawing area for the tests to be done  - just the urine testing today  You will be contacted by phone if any changes need to be made immediately.  Otherwise, you will receive a letter about your results with an explanation, but please check with MyChart first.

## 2024-05-22 NOTE — Progress Notes (Signed)
 Patient ID: Sara Doyle, female   DOB: 22-Sep-1945, 78 y.o.   MRN: [id number]        Chief Complaint: follow up urinary urgency x 3 days, hyperglycemia, low vit d       HPI:  Sara Doyle is a 78 y.o. female here with c/o above x 3 days, but for some reason seems to have resolved this am, but Denies urinary symptoms such as dysuria, frequency, flank pain, hematuria or n/v, fever, chills.  Pt denies chest pain, increased sob or doe, wheezing, orthopnea, PND, increased LE swelling, palpitations, dizziness or syncope.   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   Dr Yvone 05/2018   Hyperlipidemia    Muscle strain of thigh, right, initial encounter 01/27/2017   Syncope and collapse 2021   Vaginal dryness, menopausal 10/04/2015   Past Surgical History:  Procedure Laterality Date   BUNIONECTOMY Right    FRACTURE SURGERY  1969   right leg   FRACTURE SURGERY  2005   right leg   JOINT REPLACEMENT  2019 and L hip 2022   R hip   SKIN LESION EXCISION Right 07/08/2017   Behind right knee, benign   TIBIA FRACTURE SURGERY Right    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, '05   TOTAL HIP ARTHROPLASTY Right 05/20/2018   Procedure: RIGHT TOTAL HIP ARTHROPLASTY ANTERIOR APPROACH;  Surgeon: Yvone Rush, MD;  Location: [facility];  Service: Orthopedics;  Laterality: Right;   TUBAL LIGATION      reports that she quit smoking about 30 years ago. Her smoking use included cigarettes. She started smoking about 55 years ago. She has a  25 pack-year smoking history. She has never used smokeless tobacco. She reports current alcohol use of about 4.0 standard drinks of alcohol per week. She reports that she does not use drugs. family history includes Arrhythmia in her brother and brother; Arthritis in her father and sister; Asthma in her son; Breast cancer (age of onset: 56) in her sister; Cancer in her brother and brother; Cancer (age of onset: 43) in her sister; Depression in her sister; Fainting in her brother; Heart disease in her brother, brother, and brother; Hypertension in her mother and sister; Miscarriages / [place] in her mother, sister, and sister; Varicose Veins in her brother. Allergies  Allergen Reactions   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 and Swelling    Tongue swelling/difficulty breathing   Demerol [Meperidine] Nausea And Vomiting   Current Outpatient Medications on File Prior to Visit  Medication Sig Dispense Refill   b complex vitamins tablet Take 1 tablet by mouth daily.     Calcium  Carb-Cholecalciferol (CALCIUM  500/D PO) Take by mouth.     Cholecalciferol (VITAMIN D -3) 25 MCG (1000 UT) CAPS Take 1 capsule by mouth daily.     OVER THE COUNTER MEDICATION Areds preservision one tablet daily.     No current facility-administered medications on file prior to  visit.        ROS:  All others reviewed and negative.  Objective        PE:  BP 132/82   Pulse 64   Temp 97.9 F (36.6 C)   Ht 5' (1.524 m)   Wt 132 lb 12.8 oz (60.2 kg)   SpO2 97%   BMI 25.94 kg/m                 Constitutional: Pt appears in NAD               HENT: Head: NCAT.                Right Ear: External ear normal.                 Left Ear: External ear normal.                Eyes: . Pupils are equal, round, and reactive to light. Conjunctivae and EOM are normal               Nose: without d/c or deformity               Neck: Neck supple. Gross normal ROM               Cardiovascular: Normal rate and regular rhythm.                  Pulmonary/Chest: Effort normal and breath sounds without rales or wheezing.                Abd:  Soft, NT, ND, + BS, no organomegaly               Neurological: Pt is alert. At baseline orientation, motor grossly intact               Skin: Skin is warm. No rashes, no other new lesions, LE edema - none               Psychiatric: Pt behavior is normal without agitation   Micro: none  Cardiac tracings I have personally interpreted today:  none  Pertinent Radiological findings (summarize): none   Lab Results  Component Value Date   WBC 3.7 (L) 11/08/2023   HGB 14.6 11/08/2023   HCT 43.3 11/08/2023   PLT 225.0 11/08/2023   GLUCOSE 81 11/08/2023   CHOL 241 (H) 11/08/2023   TRIG 66.0 11/08/2023   HDL 107.70 11/08/2023   LDLCALC 120 (H) 11/08/2023   ALT 13 11/08/2023   AST 18 11/08/2023   NA 135 11/08/2023   K 4.4 11/08/2023   CL 96 11/08/2023   CREATININE 0.71 11/08/2023   BUN 16 11/08/2023   CO2 29 11/08/2023   TSH 2.89 11/08/2023   INR 1.0 02/24/2021   HGBA1C 5.3 11/08/2023   Assessment/Plan:  Sara Doyle is a 78 y.o. White or Caucasian [1] female with  has a past medical history of Allergy, Arthritis (2019), Basal cell carcinoma (11/26/2022), History of total right hip replacement (08/03/2018), Hyperlipidemia, Muscle strain of thigh, right, initial encounter (01/27/2017), Syncope and collapse (2021), and Vaginal dryness, menopausal (10/04/2015).  Urinary urgency Etiology unclear, for ua and cx, and for cephalexin  course if abnrmal  Vitamin D  deficiency Last vitamin D  Lab Results  Component Value Date   VD25OH 37.74 11/08/2023   Low normal, for oral replacement   Elevated glucose Lab Results  Component Value Date   HGBA1C 5.3 11/08/2023   Stable,  pt to continue current medical treatment  - diet ,wt control  Followup: Return if symptoms worsen or fail to improve.  Lynwood Rush, MD 05/22/2024 1:02 PM [facility]  [facility]

## 2024-05-25 ENCOUNTER — Other Ambulatory Visit: Payer: Self-pay | Admitting: Internal Medicine

## 2024-05-25 LAB — URINE CULTURE

## 2024-05-25 MED ORDER — SULFAMETHOXAZOLE-TRIMETHOPRIM 800-160 MG PO TABS: 1.0000 | ORAL_TABLET | Freq: Two times a day (BID) | ORAL | 0 refills | Status: DC

## 2024-06-30 ENCOUNTER — Other Ambulatory Visit (HOSPITAL_BASED_OUTPATIENT_CLINIC_OR_DEPARTMENT_OTHER): Payer: Self-pay

## 2024-06-30 MED ORDER — COMIRNATY 30 MCG/0.3ML IM SUSY
0.3000 mL | PREFILLED_SYRINGE | Freq: Once | INTRAMUSCULAR | 0 refills | Status: AC
  Filled 2024-06-30: qty 0.3, 1d supply, fill #0

## 2024-06-30 MED ORDER — FLUZONE HIGH-DOSE 0.5 ML IM SUSY
0.5000 mL | PREFILLED_SYRINGE | Freq: Once | INTRAMUSCULAR | 0 refills | Status: AC
  Filled 2024-06-30: qty 0.5, 1d supply, fill #0

## 2024-07-17 DIAGNOSIS — H35372 Puckering of macula, left eye: Secondary | ICD-10-CM | POA: Diagnosis not present

## 2024-07-17 DIAGNOSIS — Z961 Presence of intraocular lens: Secondary | ICD-10-CM | POA: Diagnosis not present

## 2024-08-01 DIAGNOSIS — M1711 Unilateral primary osteoarthritis, right knee: Secondary | ICD-10-CM | POA: Diagnosis not present

## 2024-08-07 ENCOUNTER — Telehealth: Payer: Self-pay

## 2024-08-07 NOTE — Telephone Encounter (Signed)
   Pre-operative Risk Assessment    Patient Name: Sara Doyle  DOB: 10/28/1945 MRN: [id number]   Date of last office visit: 03/16/24  Dr. Kennyth Date of next office visit: NA   Request for Surgical Clearance    Procedure:  Right Total Knee Arthroplasty  Date of Surgery:  Clearance TBD                               Surgeon:  Dr. Laurell Surgeon's Group or Practice Name:  [facility] Phone number:  [phone] Fax number:  [phone]   Type of Clearance Requested:   - Medical    Type of Anesthesia:  Spinal   Additional requests/questions:    Sara Doyle   08/07/2024, 5:15 PM

## 2024-08-07 NOTE — Telephone Encounter (Signed)
 A user error has taken place: encounter opened in error, closed for administrative reasons.

## 2024-08-08 ENCOUNTER — Telehealth: Payer: Self-pay

## 2024-08-08 NOTE — Telephone Encounter (Signed)
   Name: Sara Doyle  DOB: 11/06/1945  MRN: [id number]  Primary Cardiologist: None   Preoperative team, please contact this patient and set up a phone call appointment for further preoperative risk assessment. Please obtain consent and complete medication review. Thank you for your help.  I confirm that guidance regarding antiplatelet and oral anticoagulation therapy has been completed and, if necessary, noted below (none requested).  I also confirmed the patient resides in the state of [place] . As per [facility] Medical Board telemedicine laws, the patient must reside in the state in which the provider is licensed.   Sara JAYSON Braver, NP 08/08/2024, 8:46 AM [facility]

## 2024-08-08 NOTE — Telephone Encounter (Signed)
 Copied from CRM #[id number]. Topic: General - Other >> Aug 07, 2024 10:46 AM Aleatha C wrote: Reason for CRM: Patient schedule a app to discuss surgery but wants to know is it necessary for her to come in to get the knee replacement clearance , can leave info via mychart but will be out of [place] until end of December and will check back then before appointment if needed

## 2024-08-08 NOTE — Telephone Encounter (Signed)
 Left message to call back and schedule tele preop appt.    CORRECT SPELLING OF SURGEON NAME IS: DR. YVONE

## 2024-08-08 NOTE — Telephone Encounter (Signed)
Pt is scheduled for 12

## 2024-08-09 NOTE — Telephone Encounter (Signed)
 Called and left the patient a detailed message to schedule pre-op  clearance. 2nd attempt.

## 2024-08-11 NOTE — Telephone Encounter (Signed)
 Called and left the patient a detailed message to schedule pre-op  clearance. 3rd attempt.

## 2024-08-28 ENCOUNTER — Telehealth: Payer: Self-pay

## 2024-08-28 NOTE — Telephone Encounter (Signed)
 Pt returning call

## 2024-08-28 NOTE — Telephone Encounter (Signed)
PT returning call

## 2024-08-28 NOTE — Telephone Encounter (Signed)
"  °  Patient Consent for Virtual Visit        Sara Doyle has provided verbal consent on 08/28/2024 for a virtual visit (video or telephone).   CONSENT FOR VIRTUAL VISIT FOR:  Sara Doyle  By participating in this virtual visit I agree to the following:  I hereby voluntarily request, consent and authorize [facility] and its employed or contracted physicians, physician assistants, nurse practitioners or other licensed health care professionals (the Practitioner), to provide me with telemedicine health care services (the Services) as deemed necessary by the treating Practitioner. I acknowledge and consent to receive the Services by the Practitioner via telemedicine. I understand that the telemedicine visit will involve communicating with the Practitioner through live audiovisual communication technology and the disclosure of certain medical information by electronic transmission. I acknowledge that I have been given the opportunity to request an in-person assessment or other available alternative prior to the telemedicine visit and am voluntarily participating in the telemedicine visit.  I understand that I have the right to withhold or withdraw my consent to the use of telemedicine in the course of my care at any time, without affecting my right to future care or treatment, and that the Practitioner or I may terminate the telemedicine visit at any time. I understand that I have the right to inspect all information obtained and/or recorded in the course of the telemedicine visit and may receive copies of available information for a reasonable fee.  I understand that some of the potential risks of receiving the Services via telemedicine include:  Delay or interruption in medical evaluation due to technological equipment failure or disruption; Information transmitted may not be sufficient (e.g. poor resolution of images) to allow for appropriate medical decision making by the  Practitioner; and/or  In rare instances, security protocols could fail, causing a breach of personal health information.  Furthermore, I acknowledge that it is my responsibility to provide information about my medical history, conditions and care that is complete and accurate to the best of my ability. I acknowledge that Practitioner's advice, recommendations, and/or decision may be based on factors not within their control, such as incomplete or inaccurate data provided by me or distortions of diagnostic images or specimens that may result from electronic transmissions. I understand that the practice of medicine is not an exact science and that Practitioner makes no warranties or guarantees regarding treatment outcomes. I acknowledge that a copy of this consent can be made available to me via my patient portal [facility] MyChart), or I can request a printed copy by calling the office of [facility].    I understand that my insurance will be billed for this visit.   I have read or had this consent read to me. I understand the contents of this consent, which adequately explains the benefits and risks of the Services being provided via telemedicine.  I have been provided ample opportunity to ask questions regarding this consent and the Services and have had my questions answered to my satisfaction. I give my informed consent for the services to be provided through the use of telemedicine in my medical care    "

## 2024-08-28 NOTE — Telephone Encounter (Signed)
 Preop tele appt now scheduled, med rec and consent done.

## 2024-08-28 NOTE — Telephone Encounter (Signed)
 1st attempt to reach pt regarding surgical clearance and the need for an TELE appointment.  Left pt a detailed message to call back and get that scheduled.

## 2024-09-08 ENCOUNTER — Ambulatory Visit (INDEPENDENT_AMBULATORY_CARE_PROVIDER_SITE_OTHER): Admitting: Family Medicine

## 2024-09-08 VITALS — BP 106/70 | HR 81 | Temp 98.0°F | Wt 132.2 lb

## 2024-09-08 DIAGNOSIS — M1711 Unilateral primary osteoarthritis, right knee: Secondary | ICD-10-CM | POA: Diagnosis not present

## 2024-09-08 DIAGNOSIS — R7309 Other abnormal glucose: Secondary | ICD-10-CM | POA: Diagnosis not present

## 2024-09-08 DIAGNOSIS — Z01818 Encounter for other preprocedural examination: Secondary | ICD-10-CM

## 2024-09-08 DIAGNOSIS — M25561 Pain in right knee: Secondary | ICD-10-CM

## 2024-09-08 DIAGNOSIS — C44311 Basal cell carcinoma of skin of nose: Secondary | ICD-10-CM | POA: Insufficient documentation

## 2024-09-08 DIAGNOSIS — G8929 Other chronic pain: Secondary | ICD-10-CM | POA: Diagnosis not present

## 2024-09-08 NOTE — Patient Instructions (Signed)

## 2024-09-08 NOTE — Progress Notes (Signed)
 "      Sara Doyle , [birth year], 79 y.o., female MRN: [id number] Patient Care Team    Relationship Specialty Notifications Start End  Catherine Charlies LABOR, DO PCP - General Family Medicine  05/10/15   Kennyth Chew, MD PCP - Electrophysiology Cardiology  03/14/24   Teressa Toribio SQUIBB, MD (Inactive) Attending Physician Gastroenterology  10/05/16   Civils, Janna C, DDS Referring Physician Dentistry  08/12/21   Lonni Slain, MD Consulting Physician Cardiology  04/29/22   Octavia Lonni, MD Consulting Physician Ophthalmology  04/29/22   Yvone Katz, MD Referring Physician Orthopedic Surgery  04/29/22     Chief Complaint  Patient presents with   Surgical Clearance    Dr. Norleen Yvone- [facility]     Subjective: Sara Doyle is a 79 y.o. Pt presents for an OV for preoperative risk assessment  Procedure:right total knee replacement-Dr. Yvone Gaba Indication:OA Anesthesia:TBD Surgery type risk:   - Intermediate risk= orthopedics Prior anesthesia complications: No prior complications Family history of prior anesthesia complications: No known prior complications Cardiac:  - has appt with cardiology for cardiac clearance Pulmonary: Former smoker-no evidence of lung disease Endocrine: n/a Obesity:Body mass index is 25.82 kg/m. Chronic kidney disease: no prior history Chronic med that needs to be continued: n/a Anticoagulation: n/a      11/08/2023    8:36 AM 11/09/2022    1:06 PM 11/04/2022    9:18 AM 11/05/2021    1:50 PM 11/03/2021    1:03 PM  Depression screen PHQ 2/9  Decreased Interest 0 0 0 0 0  Down, Depressed, Hopeless 1 0 0 0 0  PHQ - 2 Score 1 0 0 0 0  Altered sleeping 2      Tired, decreased energy 0      Change in appetite 0      Feeling bad or failure about yourself  0      Trouble concentrating 0      Moving slowly or fidgety/restless 0      Suicidal thoughts 0      PHQ-9 Score 3       Difficult doing work/chores Not difficult at all          Data saved with a previous flowsheet row definition    Allergies[1] Social History   Social History Narrative   Sara Doyle lives with her husband, Sara Doyle relocated from [place]  Nov., 2015. Sara Doyle is retired, has 3 grown sons that live in [place].   Smoke alarm in the home, wears her seatbelt, uses sunscreen.   Ambulates independently. No dentures.   Has dentist with regular appts.    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   Dr Yvone 05/2018   Hyperlipidemia    Muscle strain of thigh, right, initial encounter 01/27/2017   Syncope and collapse 2021   Vaginal dryness, menopausal 10/04/2015   Past Surgical History:  Procedure Laterality Date   BUNIONECTOMY Right    FRACTURE SURGERY  1969   right leg   FRACTURE SURGERY  2005   right leg   JOINT REPLACEMENT  2019 and L hip 2022   R hip   SKIN LESION EXCISION Right 07/08/2017   Behind right knee, benign   TIBIA FRACTURE SURGERY Right    broke twice: '  67, '05   TOTAL HIP ARTHROPLASTY Right 05/20/2018   Procedure: RIGHT TOTAL HIP ARTHROPLASTY ANTERIOR APPROACH;  Surgeon: Yvone Rush, MD;  Location: [facility];  Service: Orthopedics;  Laterality: Right;   TUBAL LIGATION     Family History  Problem Relation Age of Onset   Hypertension Mother    Miscarriages / Stillbirths Mother    Arthritis Father        osteo   Miscarriages / Stillbirths Sister    Heart disease Brother        congenital   Breast cancer Sister 76       breast   Cancer Sister 3   Arthritis Sister    Hypertension Sister    Heart disease Brother    Cancer Brother    Varicose Veins Brother    Arrhythmia Brother    Fainting Brother    Cancer Brother        prostate, colon   Arrhythmia Brother        a-fib   Heart disease Brother    Asthma Son    Depression Sister     Miscarriages / Stillbirths Sister    Colon cancer Neg Hx    Esophageal cancer Neg Hx    Rectal cancer Neg Hx    Stomach cancer Neg Hx    Allergies as of 09/08/2024       Reactions   Oysters [shellfish Allergy] Anaphylaxis, Swelling   Tongue swelling/difficulty breathing   Demerol [meperidine] Nausea And Vomiting        Medication List        Accurate as of September 08, 2024  3:33 PM. If you have any questions, ask your nurse or doctor.          STOP taking these medications    cephALEXin  500 MG capsule Commonly known as: KEFLEX  Stopped by: Charlies Bellini, DO       TAKE these medications    aspirin  EC 81 MG tablet Take 81 mg by mouth as needed (for traveling). Swallow whole.   b complex vitamins tablet Take 1 tablet by mouth daily.   CALCIUM  500/D PO Take by mouth.   OVER THE COUNTER MEDICATION Areds preservision one tablet daily.   Vitamin D -3 25 MCG (1000 UT) Caps Take 1 capsule by mouth daily.        All past medical history, surgical history, allergies, family history, immunizations andmedications were updated in the EMR today and reviewed under the history and medication portions of their EMR.     ROS Negative, with the exception of above mentioned in HPI   Objective:  BP 106/70   Pulse 81   Temp 98 F (36.7 C)   Wt 132 lb 3.2 oz (60 kg)   SpO2 95%   BMI 25.82 kg/m  Body mass index is 25.82 kg/m. Physical Exam Vitals and nursing note reviewed.  Constitutional:      General: Sara Doyle is not in acute distress.    Appearance: Normal appearance. Sara Doyle is not ill-appearing, toxic-appearing or diaphoretic.  HENT:     Head: Normocephalic and atraumatic.     Nose: No congestion or rhinorrhea.     Mouth/Throat:     Mouth: Mucous membranes are moist.     Pharynx: No oropharyngeal exudate or posterior oropharyngeal erythema.  Eyes:     General: No scleral icterus.       Right eye: No discharge.        Left eye: No discharge.  Extraocular  Movements: Extraocular movements intact.     Conjunctiva/sclera: Conjunctivae normal.     Pupils: Pupils are equal, round, and reactive to light.  Cardiovascular:     Rate and Rhythm: Normal rate and regular rhythm.     Heart sounds: No murmur heard. Pulmonary:     Effort: Pulmonary effort is normal. No respiratory distress.     Breath sounds: Normal breath sounds. No wheezing, rhonchi or rales.  Musculoskeletal:     Cervical back: Neck supple.     Right lower leg: No edema.     Left lower leg: No edema.  Lymphadenopathy:     Cervical: No cervical adenopathy.  Skin:    General: Skin is warm.     Findings: No rash.  Neurological:     Mental Status: Sara Doyle is alert and oriented to person, place, and time. Mental status is at baseline.     Motor: No weakness.     Gait: Gait normal.  Psychiatric:        Mood and Affect: Mood normal.        Behavior: Behavior normal.        Thought Content: Thought content normal.        Judgment: Judgment normal.      No results found. No results found. No results found for this or any previous visit (from the past 24 hours).  Assessment/Plan: LEOMIA BLAKE is a 79 y.o. female present for OV for  Preoperative examination (Primary)/ Primary osteoarthritis of right knee/Chronic pain of right knee Patient is optimized medically for orthopedic surgery.  We are awaiting form to complete for her.  We collected CBC, CMP and A1c today and patient will be called with those results. - CBC w/Diff - Comp Met (CMET) - Hemoglobin A1c Sara Doyle has an appointment with cardiology for cardiac clearance. Labs: CBC, CMP, A1c No patient is free of risk when undergoing a procedure. The decision about whether to proceed with the operation belongs to the surgeon and the patient.  Reviewed expectations re: course of current medical issues. Discussed self-management of symptoms. Outlined signs and symptoms indicating need for more acute intervention. Patient  verbalized understanding and all questions were answered. Patient received an After-Visit Summary.    Orders Placed This Encounter  Procedures   CBC w/Diff   Comp Met (CMET)   Hemoglobin A1c   No orders of the defined types were placed in this encounter.  Referral Orders  No referral(s) requested today     Note is dictated utilizing voice recognition software. Although note has been proof read prior to signing, occasional typographical errors still can be missed. If any questions arise, please do not hesitate to call for verification.   electronically signed by:  Charlies Bellini, DO  [facility] - OR       [1]  Allergies Allergen Reactions   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 and Swelling    Tongue swelling/difficulty breathing   Demerol [Meperidine] Nausea And Vomiting   "

## 2024-09-09 LAB — CBC WITH DIFFERENTIAL/PLATELET
Absolute Lymphocytes: 1876 {cells}/uL (ref 850–3900)
Absolute Monocytes: 504 {cells}/uL (ref 200–950)
Basophils Absolute: 21 {cells}/uL (ref 0–200)
Basophils Relative: 0.3 %
Eosinophils Absolute: 91 {cells}/uL (ref 15–500)
Eosinophils Relative: 1.3 %
HCT: 40.8 % (ref 35.9–46.0)
Hemoglobin: 13.7 g/dL (ref 11.7–15.5)
MCH: 33.7 pg — ABNORMAL HIGH (ref 27.0–33.0)
MCHC: 33.6 g/dL (ref 31.6–35.4)
MCV: 100.2 fL (ref 81.4–101.7)
MPV: 12.1 fL (ref 7.5–12.5)
Monocytes Relative: 7.2 %
Neutro Abs: 4508 {cells}/uL (ref 1500–7800)
Neutrophils Relative %: 64.4 %
Platelets: 229 Thousand/uL (ref 140–400)
RBC: 4.07 Million/uL (ref 3.80–5.10)
RDW: 11.8 % (ref 11.0–15.0)
Total Lymphocyte: 26.8 %
WBC: 7 Thousand/uL (ref 3.8–10.8)

## 2024-09-09 LAB — HEMOGLOBIN A1C
Hgb A1c MFr Bld: 5.3 %
Mean Plasma Glucose: 105 mg/dL
eAG (mmol/L): 5.8 mmol/L

## 2024-09-09 LAB — COMPREHENSIVE METABOLIC PANEL WITH GFR
AG Ratio: 1.7 (calc) (ref 1.0–2.5)
ALT: 16 U/L (ref 6–29)
AST: 23 U/L (ref 10–35)
Albumin: 4.4 g/dL (ref 3.6–5.1)
Alkaline phosphatase (APISO): 77 U/L (ref 37–153)
BUN: 19 mg/dL (ref 7–25)
CO2: 30 mmol/L (ref 20–32)
Calcium: 9.7 mg/dL (ref 8.6–10.4)
Chloride: 100 mmol/L (ref 98–110)
Creat: 0.6 mg/dL (ref 0.60–1.00)
Globulin: 2.6 g/dL (ref 1.9–3.7)
Glucose, Bld: 88 mg/dL (ref 65–99)
Potassium: 4.7 mmol/L (ref 3.5–5.3)
Sodium: 138 mmol/L (ref 135–146)
Total Bilirubin: 0.3 mg/dL (ref 0.2–1.2)
Total Protein: 7 g/dL (ref 6.1–8.1)
eGFR: 91 mL/min/1.73m2

## 2024-09-11 ENCOUNTER — Ambulatory Visit: Payer: Self-pay | Admitting: Family Medicine

## 2024-09-18 ENCOUNTER — Encounter: Payer: Self-pay | Admitting: Nurse Practitioner

## 2024-09-18 ENCOUNTER — Ambulatory Visit: Attending: Cardiology | Admitting: Nurse Practitioner

## 2024-09-18 DIAGNOSIS — Z01810 Encounter for preprocedural cardiovascular examination: Secondary | ICD-10-CM | POA: Diagnosis not present

## 2024-09-18 NOTE — Progress Notes (Signed)
 "   Virtual Visit via Telephone Note   Because of Sara Doyle co-morbid illnesses, she is at least at moderate risk for complications without adequate follow up.  This format is felt to be most appropriate for this patient at this time.  Due to technical limitations with video connection web designer), today's appointment will be conducted as an audio only telehealth visit, and Sara Doyle verbally agreed to proceed in this manner.   All issues noted in this document were discussed and addressed.  No physical exam could be performed with this format.  Evaluation Performed:  Preoperative cardiovascular risk assessment _____________   Date:  09/18/2024   Patient ID:  Sara Doyle, DOB March 09, 1946, MRN [id number] Patient Location:  Home Provider location:   Office  Primary Care Provider:  Catherine Charlies LABOR, DO Primary Cardiologist:  None  Chief Complaint / Patient Profile   79 y.o. y/o female with a h/o palpitations, paroxysmal SVT, syncope, hyperlipidemia, former smoker (quit in 1999), family history of cardiac disease with her father and mother having died of cardiac arrest, who is pending right total knee arthroplasty and presents today for telephonic preoperative cardiovascular risk assessment.  History of Present Illness    Sara Doyle is a 79 y.o. female who presents via audio/video conferencing for a telehealth visit today.  Pt was last seen in cardiology clinic on 03/16/24 by Dr. Kennyth.  At that time Sara Doyle was doing well.  The patient is now pending procedure as outlined above. Since her last visit, she denies chest pain, shortness of breath, fatigue, palpitations, melena, hematuria, hemoptysis, diaphoresis, weakness, presyncope, syncope, orthopnea, and PND. She is active with regular 1.5 miles walks and 10-15 minute work outs on elliptical and is able to achieve > 4 METS activity without concerning cardiac symptoms. She has mild right ankle swelling felt to be  secondary to previous ankle surgery.   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   Dr Yvone 05/2018   Hyperlipidemia    Muscle strain of thigh, right, initial encounter 01/27/2017   Syncope and collapse 2021   Vaginal dryness, menopausal 10/04/2015   Past Surgical History:  Procedure Laterality Date   BUNIONECTOMY Right    FRACTURE SURGERY  1969   right leg   FRACTURE SURGERY  2005   right leg   JOINT REPLACEMENT  2019 and L hip 2022   R hip   SKIN LESION EXCISION Right 07/08/2017   Behind right knee, benign   TIBIA FRACTURE SURGERY Right    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, '05   TOTAL HIP ARTHROPLASTY Right 05/20/2018   Procedure: RIGHT TOTAL HIP ARTHROPLASTY ANTERIOR APPROACH;  Surgeon: Yvone Rush, MD;  Location: [facility];  Service: Orthopedics;  Laterality: Right;   TUBAL LIGATION      Allergies  Allergies[1]  Home Medications    Prior to Admission medications  Medication Sig Start Date End Date Taking? Authorizing Provider  aspirin  EC 81 MG tablet Take 81 mg by mouth as needed (for traveling). Swallow whole.    [provider]  b complex vitamins tablet Take 1 tablet by mouth daily.    [provider]  Calcium  Carb-Cholecalciferol (CALCIUM  500/D PO) Take by mouth.    [provider]  Cholecalciferol (VITAMIN D -3) 25  MCG (1000 UT) CAPS Take 1 capsule by mouth daily.    [provider]  OVER THE COUNTER MEDICATION Areds preservision one tablet daily.    [provider]    Physical Exam    Vital Signs:  Sara Doyle does not have vital signs available for review today.  Given telephonic nature of communication, physical exam is limited. AAOx3. NAD. Normal affect.  Speech and respirations are unlabored.  Accessory Clinical Findings    None  Assessment &  Plan    1.  Preoperative Cardiovascular Risk Assessment: According to the Revised Cardiac Risk Index (RCRI), her Perioperative Risk of Major Cardiac Event is (%): 0.9. Her Functional Capacity in METs is: 6.05 according to the Duke Activity Status Index (DASI). The patient is doing well from a cardiac perspective. Therefore, based on ACC/AHA guidelines, the patient would be at acceptable risk for the planned procedure without further cardiovascular testing.   The patient was advised that if she develops new symptoms prior to surgery to contact our office to arrange for a follow-up visit, and she verbalized understanding.  No request to hold cardiac medications. She takes prophylactic aspirin  only when traveling long distances.   A copy of this note will be routed to requesting surgeon.  Time:   Today, I have spent 10 minutes with the patient with telehealth technology discussing medical history, symptoms, and management plan.     Sara EMERSON Bane, NP-C  09/18/2024, 2:04 PM [address], [address] [place], [place] [address] Office [phone] Fax [phone]      [1]  Allergies Allergen Reactions   Merlinda Cerise Allergy] Anaphylaxis and Swelling    Tongue swelling/difficulty breathing   Demerol [Meperidine] Nausea And Vomiting   "

## 2024-09-29 ENCOUNTER — Telehealth: Payer: Self-pay

## 2024-09-29 NOTE — Telephone Encounter (Signed)
 Copied from CRM #[id number]. Topic: General - Other >> Sep 29, 2024 11:44 AM Adelita E wrote: Reason for CRM: Dagoberto with [facility] is needing patient's labs from 09/08/2024 faxed to [phone] as the patient has a pre-op  appointment on 1/29.

## 2024-09-29 NOTE — Telephone Encounter (Signed)
 Labs faxed

## 2024-10-04 ENCOUNTER — Ambulatory Visit

## 2024-10-05 NOTE — Telephone Encounter (Signed)
 Forms faxed

## 2024-10-05 NOTE — Telephone Encounter (Signed)
 Surgical clearance forms received on 10/05/24. Patient had appt on 09/08/24 for surgical clearance appt. Forms have been placed 10/05/24.  Surgery scheduled for 10/11/24; labs were faxed on 09/29/24

## 2024-10-05 NOTE — Telephone Encounter (Signed)
Completed form and returned to CMA work [employer]

## 2024-10-11 ENCOUNTER — Ambulatory Visit

## 2024-11-07 ENCOUNTER — Encounter: Admitting: Family Medicine

## 2024-11-09 ENCOUNTER — Encounter: Admitting: Family Medicine
